# Patient Record
Sex: Female | Born: 1960
Health system: Southern US, Community
[De-identification: ages and names within clinical notes are randomized; demographics above are authoritative.]

## PROBLEM LIST (undated history)

## (undated) DIAGNOSIS — L900 Lichen sclerosus et atrophicus: Secondary | ICD-10-CM

## (undated) DIAGNOSIS — I10 Essential (primary) hypertension: Secondary | ICD-10-CM

## (undated) DIAGNOSIS — L940 Localized scleroderma [morphea]: Secondary | ICD-10-CM

## (undated) DIAGNOSIS — N200 Calculus of kidney: Secondary | ICD-10-CM

## (undated) DIAGNOSIS — B029 Zoster without complications: Secondary | ICD-10-CM

## (undated) DIAGNOSIS — Z79899 Other long term (current) drug therapy: Secondary | ICD-10-CM

## (undated) DIAGNOSIS — N6310 Unspecified lump in the right breast, unspecified quadrant: Secondary | ICD-10-CM

## (undated) DIAGNOSIS — N841 Polyp of cervix uteri: Secondary | ICD-10-CM

## (undated) HISTORY — DX: Polyp of cervix uteri: N84.1

## (undated) HISTORY — DX: Zoster without complications: B02.9

## (undated) HISTORY — PX: TUBAL LIGATION: SHX77

## (undated) HISTORY — DX: Calculus of kidney: N20.0

## (undated) HISTORY — PX: NASAL SINUS SURGERY: SHX719

## (undated) HISTORY — DX: Lichen sclerosus et atrophicus: L90.0

## (undated) HISTORY — DX: Localized scleroderma (morphea): L94.0

---
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
## 1997-07-18 HISTORY — PX: CYSTECTOMY: SUR359

## 2002-10-21 ENCOUNTER — Encounter: Admission: RE | Admit: 2002-10-21 | Discharge: 2003-01-19 | Payer: Self-pay

## 2004-10-04 ENCOUNTER — Ambulatory Visit: Payer: Self-pay | Admitting: Otolaryngology

## 2011-10-11 ENCOUNTER — Other Ambulatory Visit: Payer: Self-pay | Admitting: Obstetrics and Gynecology

## 2011-10-11 DIAGNOSIS — Z1231 Encounter for screening mammogram for malignant neoplasm of breast: Secondary | ICD-10-CM

## 2011-11-08 ENCOUNTER — Ambulatory Visit
Admission: RE | Admit: 2011-11-08 | Discharge: 2011-11-08 | Disposition: A | Discharge status: Home / Self Care | Payer: BC Managed Care – PPO | Source: Ambulatory Visit | Attending: Obstetrics and Gynecology | Admitting: Obstetrics and Gynecology

## 2011-11-08 DIAGNOSIS — Z1231 Encounter for screening mammogram for malignant neoplasm of breast: Secondary | ICD-10-CM

## 2012-10-23 ENCOUNTER — Encounter: Payer: Self-pay | Admitting: Certified Nurse Midwife

## 2012-10-25 ENCOUNTER — Encounter: Payer: Self-pay | Admitting: Certified Nurse Midwife

## 2012-10-25 ENCOUNTER — Ambulatory Visit (INDEPENDENT_AMBULATORY_CARE_PROVIDER_SITE_OTHER): Payer: BC Managed Care – PPO | Admitting: Certified Nurse Midwife

## 2012-10-25 VITALS — BP 100/60 | Ht 62.25 in | Wt 177.0 lb

## 2012-10-25 DIAGNOSIS — R319 Hematuria, unspecified: Secondary | ICD-10-CM

## 2012-10-25 DIAGNOSIS — Z01419 Encounter for gynecological examination (general) (routine) without abnormal findings: Secondary | ICD-10-CM

## 2012-10-25 DIAGNOSIS — Z0000 Encounter for general adult medical examination without abnormal findings: Secondary | ICD-10-CM

## 2012-10-25 DIAGNOSIS — N3946 Mixed incontinence: Secondary | ICD-10-CM

## 2012-10-25 LAB — POCT URINALYSIS DIPSTICK
Bilirubin, UA: NEGATIVE
Ketones, UA: NEGATIVE
Leukocytes, UA: NEGATIVE
pH, UA: 5

## 2012-10-25 NOTE — Progress Notes (Signed)
52 y.o. MarriedCaucasian female   G1P1001 here for annual exam. Peri menopausal with cycle change with amenorrhea treated with Provera 09/10/2012 with withdrawal bleeding on 09-28-12. Duration of cycle 4 days, light to moderate bleeding.   Keeping menses calendar now. Occasional hot flashes and night sweats, no insomnia.  Continues urgency , frequency, incontinence sometimes with cough, has increased over the past 2 weeks.  No burning or pain or urine odor Denies fever, chills or headache.  No new personal products. No other health issues.  Sees urgent care as needed.   Patient's last menstrual period was 09/27/2012.          Sexually active: yes  The current method of family planning is tubal ligation.    Exercising: no  exercise Last mammogram: 11-08-11 Last pap: 2011 Last BMD: none Alcohol: none Tobacco: none Colonoscopy: 01-06-2012 polyps f/u 5 yrs   Health Maintenance  Topic Date Due  . Pap Smear  02/07/1979  . Colonoscopy  02/07/2011  . Influenza Vaccine  03/18/2013  . Mammogram  11/07/2013  . Tetanus/tdap  09/27/2021    Family History  Problem Relation Age of Onset  . Hypertension Mother   . Diabetes Paternal Grandmother   . Diabetes Paternal Grandfather     There is no problem list on file for this patient.   History reviewed. No pertinent past medical history.  Past Surgical History  Procedure Laterality Date  . Tubal ligation      BTSP  . Cystectomy  1999    ovarian cystectomy  . Nasal sinus surgery      Allergies: Review of patient's allergies indicates no known allergies.  Current Outpatient Prescriptions  Medication Sig Dispense Refill  . BENICAR 40 MG tablet       . hydrochlorothiazide (HYDRODIURIL) 25 MG tablet       . IBUPROFEN PO Take by mouth as needed.       No current facility-administered medications for this visit.    ROS: Pertinent items are noted in HPI.  Exam:    BP 100/60  Ht 5' 2.25" (1.581 m)  Wt 177 lb (80.287 kg)  BMI 32.12  kg/m2  LMP 09/27/2012 Weight change: @WEIGHTCHANGE @ Last 3 height recordings:  Ht Readings from Last 3 Encounters:  10/25/12 5' 2.25" (1.581 m)   General appearance: alert, cooperative and appears stated age Head: Normocephalic, without obvious abnormality, atraumatic Neck: no adenopathy, supple, symmetrical, trachea midline and thyroid not enlarged, symmetric, no tenderness/mass/nodules Lungs: clear to auscultation bilaterally Breasts: normal appearance, no masses or tenderness, No nipple retraction or dimpling Heart: regular rate and rhythm Abdomen: soft, non-tender; bowel sounds normal; no masses,  no organomegaly  No suprapubic tenderness Extremities: extremities normal, atraumatic, no cyanosis or edema Skin: Skin color, texture, turgor normal. No rashes or lesions Lymph nodes: Cervical, supraclavicular, and axillary nodes normal. no inguinal nodes palpated Neurologic: Grossly normal   Pelvic: External genitalia:  no lesions              Urethra: normal appearing urethra with no masses, tenderness or lesions,  Bladder non-tender, urethra meatus non tender or re              Bartholins and Skenes: Bartholin's, Urethra, Skene's normal                 Vagina: normal appearing vagina with normal color and discharge, no lesions              Cervix: normal appearance and thin prep PAP  obtained              Pap taken: yes        Bimanual Exam:  Uterus:  uterus is normal size, shape, consistency and nontender, anteverted                                      Adnexa:    normal adnexa in size, nontender and no masses                                      Rectovaginal: Confirms                                      Anus:  normal sphincter tone, no lesions  A: Normal well woman exam Perimenopausal with irregular cycles Urinary frequency with  ?mixed incontinence Family history of diabetes     P:Reviewed health and wellness as pertinent Mammogram yearly  Stressed due and need to schedule,  declined our office scheduling Pap smear as indicated Continue menses calendar an notify if no menses in 3 months, patient agreeable Encouraged to increase water intake and decrease tea, coffee, soda Given UTI warning signs and symptoms Lab: Urine micro and culture CMP, Lipid panel, TSH, Hgb A1C  return annually or prn      An After Visit Summary was printed and given to the patient.  Reviewed, TL

## 2012-10-25 NOTE — Patient Instructions (Signed)

## 2012-10-26 DIAGNOSIS — N3946 Mixed incontinence: Secondary | ICD-10-CM | POA: Insufficient documentation

## 2012-10-26 LAB — URINE CULTURE: Colony Count: 45000

## 2012-10-26 LAB — LIPID PANEL
Cholesterol: 181 mg/dL (ref 0–200)
Total CHOL/HDL Ratio: 3.4 Ratio
Triglycerides: 114 mg/dL (ref <150)
VLDL: 23 mg/dL (ref 0–40)

## 2012-10-26 LAB — COMPREHENSIVE METABOLIC PANEL
BUN: 19 mg/dL (ref 6–23)
CO2: 27 mEq/L (ref 19–32)
Calcium: 9.9 mg/dL (ref 8.4–10.5)
Chloride: 102 mEq/L (ref 96–112)
Creat: 0.89 mg/dL (ref 0.50–1.10)
Glucose, Bld: 103 mg/dL — ABNORMAL HIGH (ref 70–99)

## 2012-10-26 LAB — URINALYSIS, MICROSCOPIC ONLY

## 2012-10-28 LAB — IPS PAP TEST WITH HPV

## 2012-10-30 ENCOUNTER — Telehealth: Payer: Self-pay | Admitting: *Deleted

## 2012-10-30 NOTE — Telephone Encounter (Signed)
Message copied by Alisa Graff on Tue Oct 30, 2012  5:15 PM ------      Message from: Eliezer Bottom      Created: Mon Oct 29, 2012  1:08 PM       Fannie Knee please inform patient of results. It doesn't look like patient has a pcp so she will need a referral if not. I already have patient in pap recall so that is done. ------

## 2012-10-30 NOTE — Telephone Encounter (Signed)
Notified of lab results as Raven Romero's instructions.  Patient states she has no symptoms of yeast so may wait to see if clears on its own.  States she does not have PCP and would like to know if Raven Romero has someone from [place] area to recommend.  Has signed up for MY Chart and would like copy of labs. Can we release to My Chart? Patient also wants you to update her records that she had colonoscopy 12-2011.

## 2012-10-31 NOTE — Telephone Encounter (Signed)
I do not know a particular group in [place].  Can not release abnormal results from my chart that I am aware of.

## 2012-11-06 NOTE — Telephone Encounter (Signed)
agree

## 2012-11-06 NOTE — Telephone Encounter (Signed)
Patient notified that Debbi does not know anyone in [place] area and that we will be able to send results thru My Chart.  Patient states she has already been able to print now.  Will make PCP appt herself.

## 2012-11-07 NOTE — Telephone Encounter (Signed)
pt states she is returning a call to [employer]

## 2012-11-08 ENCOUNTER — Telehealth: Payer: Self-pay | Admitting: *Deleted

## 2012-11-08 NOTE — Telephone Encounter (Signed)
Patient of D. Darcel Bayley, was seen 10/25/2012, AEX, lab results show yeast infection. Tried OTC x 7 day but is no better. Please advise . Fannie Knee . Will get chart if needed.  [facility] [place].

## 2012-11-09 ENCOUNTER — Other Ambulatory Visit: Payer: Self-pay | Admitting: Orthopedic Surgery

## 2012-11-09 ENCOUNTER — Other Ambulatory Visit: Payer: Self-pay | Admitting: Nurse Practitioner

## 2012-11-09 DIAGNOSIS — B373 Candidiasis of vulva and vagina: Secondary | ICD-10-CM

## 2012-11-09 MED ORDER — FLUCONAZOLE 150 MG PO TABS: 150.0000 mg | ORAL_TABLET | Freq: Once | ORAL | Status: DC

## 2012-11-09 NOTE — Telephone Encounter (Signed)
Pt was called and given message that RX was sent to pharmacy.

## 2012-11-09 NOTE — Telephone Encounter (Signed)
Should be seen to confirm, can she wait until Monday?

## 2012-11-09 NOTE — Telephone Encounter (Signed)
Spoke to patient to confirm that this request has already been taken care of. She spoke to Amy earlier today and Patty called medication in.

## 2012-11-14 NOTE — Telephone Encounter (Signed)
Pt has separate phone note on 11/08/12 and problem was taken care of and meds called to pharmacy.  Encounter closed.

## 2012-11-19 ENCOUNTER — Encounter: Payer: Self-pay | Admitting: Certified Nurse Midwife

## 2012-11-19 NOTE — Telephone Encounter (Signed)
Joy did not know if you needed this info.

## 2012-11-20 ENCOUNTER — Ambulatory Visit (INDEPENDENT_AMBULATORY_CARE_PROVIDER_SITE_OTHER): Payer: BC Managed Care – PPO | Admitting: Certified Nurse Midwife

## 2012-11-20 ENCOUNTER — Encounter: Payer: Self-pay | Admitting: Certified Nurse Midwife

## 2012-11-20 ENCOUNTER — Telehealth: Payer: Self-pay | Admitting: *Deleted

## 2012-11-20 VITALS — BP 118/70 | HR 72 | Resp 16 | Wt 174.0 lb

## 2012-11-20 DIAGNOSIS — N3946 Mixed incontinence: Secondary | ICD-10-CM

## 2012-11-20 DIAGNOSIS — N390 Urinary tract infection, site not specified: Secondary | ICD-10-CM

## 2012-11-20 DIAGNOSIS — B373 Candidiasis of vulva and vagina: Secondary | ICD-10-CM

## 2012-11-20 LAB — POCT URINALYSIS DIPSTICK
Protein, UA: NEGATIVE
Spec Grav, UA: 1.02
Urobilinogen, UA: NEGATIVE

## 2012-11-20 MED ORDER — CIPROFLOXACIN HCL 500 MG PO TABS: 500.0000 mg | ORAL_TABLET | Freq: Two times a day (BID) | ORAL | Status: DC

## 2012-11-20 MED ORDER — PHENAZOPYRIDINE HCL 200 MG PO TABS: 200.0000 mg | ORAL_TABLET | Freq: Three times a day (TID) | ORAL | Status: DC

## 2012-11-20 MED ORDER — PHENAZOPYRIDINE HCL 200 MG PO TABS: 200.0000 mg | ORAL_TABLET | Freq: Three times a day (TID) | ORAL | Status: DC | PRN

## 2012-11-20 NOTE — Telephone Encounter (Signed)
Patient called in response to her My Chart info to D. Leonard,CNM,. States having UTI symptoms and follow up with yeast infection problem. Appt. Given to day @ 2:30pm

## 2012-11-20 NOTE — Progress Notes (Signed)
52 y.o. Married Caucasian female G1P1001 here for follow up of yeast vaginitis treated with Diflucan initiated on October 26 2012. Completed all medication as directed.  Denies any symptoms of vaginal itching or burning now. Patient complaining of urinary frequency, urgency and discomfort to pain at end of stream of urine. Incontinence has increased.  Denies fever,chills,headache or back pain. "Feels like what I had before it became a severe UTI"  O: Healthy WD,WN female Affect:normal, orientation X3  Skin:warm and dry Abdomen:suprapubic tenderness Pelvic exam:EXTERNAL GENITALIA: normal appearing vulva with no masses, tenderness or lesions Urethra: tender, Urethra meatus: tender, slight redness, 3 drops of leakage noted  palpation Bladder:tender VAGINA: no abnormal discharge or lesions, Wet Prep/KOH one yeast bud, pH 4.0 , negative clue CERVIX: no lesions or cervical motion tenderness UTERUS:normal, non tender ADNEXA: no masses palpable and non tender POCT urine: Leukocytes 2+ Blood 1+    A: Yeast vaginitis resolved UTI Mixed incontinence   P:Reviewed findings, reassured resolved Discussed findings of UTI warning signs and symptoms given. Decrease tea and coffee use. Rx Cipro see order Rx Pyridium see order Lab: urine culture Discussed with patient once the UTI has resolved scheduling an appointment with Dr. Edward Jolly for evaluation of incontinence issue.  Patient would like to schedule.   TOC 2 weeks if culture positive    Rv prn Reviewed, TL

## 2012-11-22 ENCOUNTER — Encounter: Payer: Self-pay | Admitting: Certified Nurse Midwife

## 2012-11-22 ENCOUNTER — Telehealth: Payer: Self-pay | Admitting: Orthopedic Surgery

## 2012-11-22 LAB — URINE CULTURE: Colony Count: NO GROWTH

## 2012-11-22 NOTE — Telephone Encounter (Signed)
Spoke with Raven Romero about urine culture being negative. Raven Romero reports she is still having some symptoms. Raven Romero has had kidney stones before and is wondering if they are flaring up again. Raven Romero states her urologist has retired. Raven Romero would like to see Dr. Edward Jolly. Sched appt with BS tomorrow at 1:45 per Raven Romero request.

## 2012-11-22 NOTE — Telephone Encounter (Signed)
Call to check on pt's symptoms. Reached VM. Advised pt to call back and make appt if she desires.

## 2012-11-22 NOTE — Telephone Encounter (Signed)
Message copied by Alfredo Batty on Thu Nov 22, 2012  3:11 PM ------      Message from: Verner Chol      Created: Thu Nov 22, 2012  1:38 PM       Notify urine culture no growth, if patient is still symptomatic she will need to see PCP or urology      Also will need to schedule consult with Dr. Edward Jolly for urinary incontinence if she does not see a urologist. ------

## 2012-11-23 ENCOUNTER — Encounter: Payer: Self-pay | Admitting: Obstetrics and Gynecology

## 2012-11-23 ENCOUNTER — Ambulatory Visit (INDEPENDENT_AMBULATORY_CARE_PROVIDER_SITE_OTHER): Payer: BC Managed Care – PPO | Admitting: Obstetrics and Gynecology

## 2012-11-23 VITALS — BP 100/60 | Wt 174.0 lb

## 2012-11-23 DIAGNOSIS — N393 Stress incontinence (female) (male): Secondary | ICD-10-CM

## 2012-11-23 DIAGNOSIS — R350 Frequency of micturition: Secondary | ICD-10-CM

## 2012-11-23 DIAGNOSIS — N952 Postmenopausal atrophic vaginitis: Secondary | ICD-10-CM

## 2012-11-23 DIAGNOSIS — N390 Urinary tract infection, site not specified: Secondary | ICD-10-CM

## 2012-11-23 LAB — POCT URINALYSIS DIPSTICK
Ketones, UA: NEGATIVE
Leukocytes, UA: NEGATIVE
Protein, UA: NEGATIVE
pH, UA: 5

## 2012-11-23 MED ORDER — ESTROGENS, CONJUGATED 0.625 MG/GM VA CREA: TOPICAL_CREAM | Freq: Every day | VAGINAL | Status: DC

## 2012-11-23 NOTE — Patient Instructions (Addendum)
Atrophic Vaginitis Atrophic vaginitis is a problem of low levels of estrogen in women. This problem can happen at any age. It is most common in women who have gone through menopause ("the change").  HOW WILL I KNOW IF I HAVE THIS PROBLEM? You may have:  Trouble with peeing (urinating), such as:  Going to the bathroom often.  A hard time holding your pee until you reach a bathroom.  Leaking pee.  Having pain when you pee.  Itching or a burning feeling.  Vaginal bleeding and spotting.  Pain during sex.  Dryness of the vagina.  A yellow, bad-smelling fluid (discharge) coming from the vagina. HOW WILL MY DOCTOR CHECK FOR THIS PROBLEM?  During your exam, your doctor will likely find the problem.  If there is a vaginal fluid, it may be checked for infection. HOW WILL THIS PROBLEM BE TREATED? Keep the vulvar skin as clean as possible. Moisturizers and lubricants can help with some of the symptoms. Estrogen replacement can help. There are 2 ways to take estrogen:  Systemic estrogen gets estrogen to your whole body. It takes many weeks or months before the symptoms get better.  You take an estrogen pill.  You use a skin patch. This is a patch that you put on your skin.  If you still have your uterus, your doctor may ask you to take a hormone. Talk to your doctor about the right medicine for you.  Estrogen cream.  This puts estrogen only at the part of your body where you apply it. The cream is put into the vagina or put on the vulvar skin. For some women, estrogen cream works faster than pills or the patch. CAN ALL WOMEN WITH THIS PROBLEM USE ESTROGEN? No. Women with certain types of cancer, liver problems, or problems with blood clots should not take estrogen. Your doctor can help you decide the best treatment for your symptoms. Document Released: 12/21/2007 Document Revised: 09/26/2011 Document Reviewed: 12/21/2007 [facility] Patient Information 2013 [place], [place]. Urinary  Frequency The number of times a normal person urinates depends upon how much liquid they take in and how much liquid they are losing. If the temperature is hot and there is high humidity then the person will sweat more and usually breathe a little more frequently. These factors decrease the amount of frequency of urination that would be considered normal. The amount you drink is easily determined, but the amount of fluid lost is sometimes more difficult to calculate.  Fluid is lost in two ways:  Sensible fluid loss is usually measured by the amount of urine that you get rid of. Losses of fluid can also occur with diarrhea.  Insensible fluid loss is more difficult to measure. It is caused by evaporation. Insensible loss of fluid occurs through breathing and sweating. It usually ranges from a little less than a quart to a little more than a quart of fluid a day. In normal temperatures and activity levels the average person may urinate 4 to 7 times in a 24-hour period. Needing to urinate more often than that could indicate a problem. If one urinates 4 to 7 times in 24 hours and has large volumes each time, that could indicate a different problem from one who urinates 4 to 7 times a day and has small volumes. The time of urinating is also an important. Most urinating should be done during the waking hours. Getting up at night to urinate frequently can indicate some problems. CAUSES  The bladder is the  organ in your lower abdomen that holds urine. Like a balloon, it swells some as it fills up. Your nerves sense this and tell you it is time to head for the bathroom. There are a number of reasons that you might feel the need to urinate more often than usual. They include:  Urinary tract infection. This is usually associated with other signs such as burning when you urinate.  In men, problems with the prostate (a walnut-size gland that is located near the tube that carries urine out of your body). There are  two reasons why the prostate can cause an increased frequency of urination:  An enlarged prostate that does not let the bladder empty well. If the bladder only half empties when you urinate then it only has half the capacity to fill before you have to urinate again.  The nerves in the bladder become more hypersensitive with an increased size of the prostate even if the bladder empties completely.  Pregnancy.  Obesity. Excess weight is more likely to cause a problem for women more than for men.  Bladder stones or other bladder problems.  Caffeine.  Alcohol.  Medications. For example, drugs that help the body get rid of extra fluid (diuretics) increase urine production. Some other medicines must be taken with lots of fluids.  Muscle or nerve weakness. This might be the result of a spinal cord injury, a stroke, multiple sclerosis or Parkinson's disease.  Long-standing diabetes can decrease the sensation of the bladder. This loss of sensation makes it harder to sense the bladder needs to be emptied. Over a period of years the bladder is stretched out by constant overfilling. This weakens the bladder muscles so that the bladder does not empty well and has less capacity to fill with new urine.  Interstitial cystitis (also called painful bladder syndrome). This condition develops because the tissues that line the insider of the bladder are inflamed (inflammation is the body's way of reacting to injury or infection). It causes pain and frequent urination. It occurs in women more often than in men. DIAGNOSIS   To decide what might be causing your urinary frequency, your healthcare provider will probably:  Ask about symptoms you have noticed.  Ask about your overall health. This will include questions about any medications you are taking.  Do a physical examination.  Order some tests. These might include:  A blood test to check for diabetes or other health issues that could be contributing  to the problem.  Urine testing. This could measure the flow of urine and the pressure on the bladder.  A test of your neurological system (the brain, spinal cord and nerves). This is the system that senses the need to urinate.  A bladder test to check whether it is emptying completely when you urinate.  Cytoscopy. This test uses a thin tube with a tiny camera on it. It offers a look inside your urethra and bladder to see if there are problems.  Imaging tests. You might be given a contrast dye and then asked to urinate. X-rays are taken to see how your bladder is working. TREATMENT  It is important for you to be evaluated to determine if the amount or frequency that you have is unusual or abnormal. If it is found to be abnormal the cause should be determined and this can usually be found out easily. Depending upon the cause treatment could include medication, stimulation of the nerves, or surgery. There are not too many things that you  can do as an individual to change your urinary frequency. It is important that you balance the amount of fluid intake needed to compensate for your activity and the temperature. Medical problems will be diagnosed and taken care of by your physician. There is no particular bladder training such as Kegel's exercises that you can do to help urinary frequency. This is an exercise this is usually done for people who have leaking of urine when they laugh cough or sneeze. HOME CARE INSTRUCTIONS   Take any medications your healthcare provider prescribed or suggested. Follow the directions carefully.  Practice any lifestyle changes that are recommended. These might include:  Drinking less fluid or drinking at different times of the day. If you need to urinate often during the night, for example, you may need to stop drinking fluids early in the evening.  Cutting down on caffeine or alcohol. They both can make you need to urinate more often than normal. Caffeine is found in  coffee, tea and sodas.  Losing weight, if that is recommended.  Keep a journal or a log. You might be asked to record how much you drink and when and when you feel the need to urinate. This will also help evaluate how well the treatment provided by your physician is working. SEEK MEDICAL CARE IF:   Your need to urinate often gets worse.  You feel increased pain or irritation when you urinate.  You notice blood in your urine.  You have questions about any medications that your healthcare provider recommended.  You notice blood, pus or swelling at the site of any test or treatment procedure.  You develop a fever of more than 100.5 F (38.1 C). SEEK IMMEDIATE MEDICAL CARE IF:  You develop a fever of more than 102.0 F (38.9 C). Document Released: 04/30/2009 Document Revised: 09/26/2011 Document Reviewed: 04/30/2009 [facility] Patient Information 2013 [place], [place].

## 2012-11-23 NOTE — Progress Notes (Signed)
Patient ID: Raven Romero, female   DOB: Jan 01, 1961, 52 y.o.   MRN: [id number]  Subjective  51 year old G1P1 Caucasian female who presents with persistent concerns of vaginal irritation and discomfort.   Has been treated for multiple episodes of yeast infections, bacterial vaginosis, or urinary tract infection.      Saw Sara Chu this week with frequency, nocturia, and back pain.  Had urinalysis showing WBCs and RBCs and was treated with Ciprofloxacin and Pyridium.  Medications discontinued due to the negative urine culture.  States that the back pain is resolved and that she feels much better today.      Patient has a history of kidney stones and used to see Dr. Vonita Moss.  Has a diagosis of interstitial cystitis.  Did bladder instillations years ago.  Told she has a small bladder volume.     Some urinary incontinence with running.  Worse in the last 6 months.    Menses just light spotting the last two months.  Took Provera.  Feels worse overall since taking Provera. No normal period since August 2013.  No hot flashes or hight sweats.  Some vaginal dryness.    Mammogram done this am at [facility].   Patient's husband is being treated for prostatitis and prostatic hypertrophy for the last several months.  He has taken multiple rounds of antibiotics.   Drinks very little coffee.  No regular caffeine drinks, citrus or carbonated beverages.   Objective  Urine dip with trace nitrites  Abdomen - soft, nontender, nondistended.   No hepatosplenomegaly or organomegaly. Pelvic - Normal external genitalia and urethra.  First degree cystocele and first degree rectocele with valsalva maneuver.  Cervix and vagina no lesions.  Uterus small and nontender.  No adnexal masses or tenderness.  Assessment  Urinary frequency. Nocturia. Genuine stress incontinence. Mild atrophic vaginitis. Patient's partner with prostatitis.  Plan.  UC sent. I discussed with patient stress incontinence and  options for treatment including physical therapy and midurethral slings.  Patient given ACOG literature to take home with her today. Rx for premarin vaginal cream 1/2 gram pv at hs for the next 10 night. I will re-evaluate patient in 2 weeks. Consider Affirm testing.

## 2012-12-03 ENCOUNTER — Encounter: Payer: Self-pay | Admitting: Obstetrics and Gynecology

## 2012-12-03 ENCOUNTER — Telehealth: Payer: Self-pay | Admitting: Obstetrics and Gynecology

## 2012-12-03 NOTE — Telephone Encounter (Signed)
Dr. Edward Jolly,  This patient canceled her 2 week reck apptointment5/23/2014, Patient says she is feeling better and no longer needs this appointment .T Thanks,  Arna Medici

## 2012-12-06 ENCOUNTER — Ambulatory Visit: Payer: BC Managed Care – PPO | Admitting: Obstetrics and Gynecology

## 2012-12-14 ENCOUNTER — Telehealth: Payer: Self-pay | Admitting: *Deleted

## 2012-12-17 ENCOUNTER — Telehealth: Payer: Self-pay | Admitting: *Deleted

## 2012-12-17 NOTE — Telephone Encounter (Signed)
FOLLOW UP MAMMOGRAM SCANNED IN TO EPIC ON 12/07/2012 BY JASMINE. DO YOU STILL NEED TO PUT ON HOLD. OR OUT OF HOLD . SEE PRINTED COPY TO WRITE ON IF NEED. SUE

## 2012-12-18 ENCOUNTER — Telehealth: Payer: Self-pay | Admitting: *Deleted

## 2012-12-18 NOTE — Telephone Encounter (Signed)
Patient notified of mammogram results and advise of Dr. Tresa Res to have 6 month follow up mammogram at [facility] radiology. patient understands this and is agreeable.

## 2012-12-18 NOTE — Telephone Encounter (Signed)
Tell pt they saw a place in her left breast that they think is benign, but they want a f/u mm in 6 mos.  She should schedule it there.

## 2012-12-18 NOTE — Telephone Encounter (Signed)
Left message on CB# of Home and Cell to return call to our office for results of mammogram per Dr. Tresa Res.

## 2012-12-18 NOTE — Telephone Encounter (Signed)
Patient final result of mammogram faxed from [facility]. Report in your cabinet. sue

## 2013-03-04 NOTE — Telephone Encounter (Signed)
, °

## 2013-04-17 ENCOUNTER — Ambulatory Visit: Payer: Self-pay | Admitting: Specialist

## 2013-05-23 ENCOUNTER — Other Ambulatory Visit: Payer: Self-pay

## 2013-10-29 ENCOUNTER — Ambulatory Visit (INDEPENDENT_AMBULATORY_CARE_PROVIDER_SITE_OTHER): Payer: BC Managed Care – PPO | Admitting: Certified Nurse Midwife

## 2013-10-29 ENCOUNTER — Encounter: Payer: Self-pay | Admitting: Certified Nurse Midwife

## 2013-10-29 VITALS — BP 118/64 | HR 68 | Resp 16 | Ht 61.75 in | Wt 182.0 lb

## 2013-10-29 DIAGNOSIS — N951 Menopausal and female climacteric states: Secondary | ICD-10-CM

## 2013-10-29 DIAGNOSIS — Z0000 Encounter for general adult medical examination without abnormal findings: Secondary | ICD-10-CM

## 2013-10-29 DIAGNOSIS — Z01419 Encounter for gynecological examination (general) (routine) without abnormal findings: Secondary | ICD-10-CM

## 2013-10-29 LAB — POCT URINALYSIS DIPSTICK
Bilirubin, UA: NEGATIVE
Blood, UA: NEGATIVE
GLUCOSE UA: NEGATIVE
Ketones, UA: NEGATIVE
Leukocytes, UA: NEGATIVE
NITRITE UA: NEGATIVE
PROTEIN UA: NEGATIVE
UROBILINOGEN UA: NEGATIVE
pH, UA: 5

## 2013-10-29 LAB — HEMOGLOBIN, FINGERSTICK: HEMOGLOBIN, FINGERSTICK: 12.1 g/dL (ref 12.0–16.0)

## 2013-10-29 NOTE — Progress Notes (Signed)
53 y.o. [id number] Married Caucasian Fe here for annual exam. Perimenopausal with irregular cycles. Complaining of hot flashes and night sweats for the past month. No period since or 12/14 or 2/15 which lasted 10 days with spotting afterward with cramping also. No bleeding since. Occasional vaginal dryness, much better with probiotic use. Sees Urgent care prn. Patient did not follow up with elevated cholesterol and borderline Hgb A1c . No other health issues today.  Patient's last menstrual period was 08/18/2013.          Sexually active: yes  The current method of family planning is tubal ligation.    Exercising: yes walking Smoker:  no  Health Maintenance: Pap:  10-25-12 neg HPV HR neg MMG:  11-23-12 bilateral, 12-04-12 left breast probably benign, 06-07-13 report doesn't look finished, patient states she was told it was fine repeat in 5/15 Colonoscopy: 01-06-12 f/u 151yrs BMD:   none TDaP:  2013 Labs: Poct urine-neg, hgb-12.1 Self breast exam:done occ   reports that she has never smoked. She does not have any smokeless tobacco history on file. She reports that she drinks about 2.5 ounces of alcohol per week. She reports that she does not use illicit drugs.  Past Medical History  Diagnosis Date  . Kidney stones     Past Surgical History  Procedure Laterality Date  . Tubal ligation      BTSP  . Cystectomy  1999    ovarian cystectomy  . Nasal sinus surgery      Current Outpatient Prescriptions  Medication Sig Dispense Refill  . BENICAR 40 MG tablet daily.       . hydrochlorothiazide (HYDRODIURIL) 25 MG tablet daily.       . IBUPROFEN PO Take by mouth as needed.       No current facility-administered medications for this visit.    Family History  Problem Relation Age of Onset  . Hypertension Mother   . Breast cancer Mother   . Diabetes Paternal Grandmother   . Diabetes Paternal Grandfather     ROS:  Pertinent items are noted in HPI.  Otherwise, a comprehensive ROS was  negative.  Exam:   BP 118/64  Pulse 68  Resp 16  Ht 5' 1.75" (1.568 m)  Wt 182 lb (82.555 kg)  BMI 33.58 kg/m2  LMP 08/18/2013 Height: 5' 1.75" (156.8 cm)  Ht Readings from Last 3 Encounters:  10/29/13 5' 1.75" (1.568 m)  10/25/12 5' 2.25" (1.581 m)    General appearance: alert, cooperative and appears stated age Head: Normocephalic, without obvious abnormality, atraumatic Neck: no adenopathy, supple, symmetrical, trachea midline and thyroid normal to inspection and palpation and non-palpable Lungs: clear to auscultation bilaterally Breasts: normal appearance, no masses or tenderness, No nipple retraction or dimpling, No nipple discharge or bleeding, No axillary or supraclavicular adenopathy Heart: regular rate and rhythm Abdomen: soft, non-tender; no masses,  no organomegaly Extremities: extremities normal, atraumatic, no cyanosis or edema Skin: Skin color, texture, turgor normal. No rashes or lesions Lymph nodes: Cervical, supraclavicular, and axillary nodes normal. No abnormal inguinal nodes palpated Neurologic: Grossly normal   Pelvic: External genitalia:  no lesions              Urethra:  normal appearing urethra with no masses, tenderness or lesions              Bartholin's and Skene's: normal                 Vagina: normal appearing vagina with normal color  and discharge, no lesions              Cervix: normal, non tender              Pap taken: no Bimanual Exam:  Uterus:  normal size, contour, position, consistency, mobility, non-tender and anteverted              Adnexa: normal adnexa and no mass, fullness, tenderness               Rectovaginal: Confirms               Anus:  normal sphincter tone, no lesions  A:  Well Woman with normal exam  Contraception Tubal ligation  Perimenopausal with amenorrhea  Screening labs  P:   Reviewed health and wellness pertinent to exam  Aware of etiology and forgets to record on calendar  Discussed checking FSH to evaluate,  TSH, prolactin may need Provera challenge, will advise once labs in.  Labs:Lipid panel, CMP,Hgb A1c,Vitamin D  Pap smear as per guidelines   Mammogram yearly stressed follow up as indicated by last follow up for benign appearing mass pap smear not taken today Stressed importance of follow up of labs if abnormal  counseled on breast self exam, mammography screening, diet and exercise and weight management encouraged. Questions addressed.  return annually or prn  An After Visit Summary was printed and given to the patient.

## 2013-10-29 NOTE — Patient Instructions (Signed)

## 2013-10-30 LAB — COMPREHENSIVE METABOLIC PANEL
ALK PHOS: 76 U/L (ref 39–117)
ALT: 11 U/L (ref 0–35)
AST: 13 U/L (ref 0–37)
Albumin: 4 g/dL (ref 3.5–5.2)
BILIRUBIN TOTAL: 0.3 mg/dL (ref 0.2–1.2)
BUN: 14 mg/dL (ref 6–23)
CO2: 25 meq/L (ref 19–32)
CREATININE: 0.78 mg/dL (ref 0.50–1.10)
Calcium: 9.6 mg/dL (ref 8.4–10.5)
Chloride: 102 mEq/L (ref 96–112)
Glucose, Bld: 132 mg/dL — ABNORMAL HIGH (ref 70–99)
Potassium: 3.9 mEq/L (ref 3.5–5.3)
Sodium: 140 mEq/L (ref 135–145)
Total Protein: 6.6 g/dL (ref 6.0–8.3)

## 2013-10-30 LAB — LIPID PANEL
Cholesterol: 170 mg/dL (ref 0–200)
HDL: 54 mg/dL (ref >39)
LDL Cholesterol: 80 mg/dL (ref 0–99)
Total CHOL/HDL Ratio: 3.1 Ratio
Triglycerides: 180 mg/dL — ABNORMAL HIGH (ref <150)
VLDL: 36 mg/dL (ref 0–40)

## 2013-10-30 LAB — HEMOGLOBIN A1C
HEMOGLOBIN A1C: 5.9 % — AB (ref <5.7)
Mean Plasma Glucose: 123 mg/dL — ABNORMAL HIGH (ref <117)

## 2013-10-30 LAB — VITAMIN D 25 HYDROXY (VIT D DEFICIENCY, FRACTURES): VIT D 25 HYDROXY: 39 ng/mL (ref 30–89)

## 2013-10-30 LAB — TSH: TSH: 1.308 u[IU]/mL (ref 0.350–4.500)

## 2013-10-30 LAB — FOLLICLE STIMULATING HORMONE: FSH: 78.8 m[IU]/mL

## 2013-10-31 ENCOUNTER — Other Ambulatory Visit: Payer: Self-pay | Admitting: Orthopedic Surgery

## 2013-10-31 ENCOUNTER — Other Ambulatory Visit: Payer: Self-pay | Admitting: Certified Nurse Midwife

## 2013-10-31 DIAGNOSIS — N912 Amenorrhea, unspecified: Secondary | ICD-10-CM

## 2013-10-31 DIAGNOSIS — N951 Menopausal and female climacteric states: Secondary | ICD-10-CM

## 2013-10-31 DIAGNOSIS — R739 Hyperglycemia, unspecified: Secondary | ICD-10-CM

## 2013-10-31 MED ORDER — MEDROXYPROGESTERONE ACETATE 10 MG PO TABS: 10.0000 mg | ORAL_TABLET | Freq: Every day | ORAL | Status: DC

## 2013-11-07 ENCOUNTER — Telehealth: Payer: Self-pay

## 2013-11-07 NOTE — Telephone Encounter (Signed)
Message copied by Jannet AskewHINES, Channa Hazelett E on Thu Nov 07, 2013 11:33 AM ------      Message from: Verner CholLEONARD, DEBORAH S      Created: Fri Nov 01, 2013  8:12 AM       Please make sure patient does make appointment with PCP. ------

## 2013-11-07 NOTE — Telephone Encounter (Signed)
Yes please put that on your recall for Deb for a few weeks

## 2013-11-07 NOTE — Telephone Encounter (Signed)
Left message to call Kaitlyn at [phone]. 

## 2013-11-07 NOTE — Telephone Encounter (Signed)
Spoke with patient. Patient states that she has not been able to call and schedule follow up with PCP. States "It is on my list of things to do." Offered to call PCP and schedule follow up appointment for patient. Patient declines offer stating that she has been busy and would be glad to call. States she may want to find PCP closer in [place]. Advised of importance for follow up appointment. Patient states that she will call to schedule asap. Will call back with any further needs, questions, or concerns.   Verner Choleborah S. Leonard CNM, would you like me to follow up with patient in a couple weeks to make sure she was able to schedule PCP appointment?

## 2013-11-08 NOTE — Progress Notes (Signed)
Reviewed personally.  M. Suzanne Esten Dollar, MD.  

## 2013-11-14 ENCOUNTER — Telehealth: Payer: Self-pay | Admitting: Certified Nurse Midwife

## 2013-11-14 NOTE — Telephone Encounter (Signed)
left message for scheduling secretary

## 2013-11-14 NOTE — Telephone Encounter (Signed)
Randa EvensJoanne is calling Martie LeeSabrina back

## 2013-11-25 NOTE — Telephone Encounter (Signed)
Patient has appointment scheduled on 12/24/13 at 1425 at [facility] with Dr.Holwerda.  Routing to provider for final review. Patient agreeable to disposition. Will close encounter

## 2014-04-08 ENCOUNTER — Telehealth: Payer: Self-pay

## 2014-04-08 NOTE — Telephone Encounter (Signed)
Called patient to let her know that I was faxing her the wellness questionnaire sheet that she wanted D leonard to fill out. Pt notified.

## 2014-04-15 ENCOUNTER — Encounter: Payer: Self-pay | Admitting: Nurse Practitioner

## 2014-04-15 ENCOUNTER — Ambulatory Visit (INDEPENDENT_AMBULATORY_CARE_PROVIDER_SITE_OTHER): Payer: BC Managed Care – PPO | Admitting: Nurse Practitioner

## 2014-04-15 VITALS — BP 112/62 | HR 68 | Temp 98.2°F | Resp 16 | Wt 180.0 lb

## 2014-04-15 DIAGNOSIS — R300 Dysuria: Secondary | ICD-10-CM

## 2014-04-15 MED ORDER — CIPROFLOXACIN HCL 500 MG PO TABS: 500.0000 mg | ORAL_TABLET | Freq: Two times a day (BID) | ORAL | Status: DC

## 2014-04-15 NOTE — Progress Notes (Signed)
S:  @53  y.o.Married Caucasian female presents with complaint of UTI. Symptoms began 2 days ago. With symptoms of burning with urination, dysuria, urinary urgency, pressure lower abdomen.  Pertinent negatives include:  having no constitutional symptoms, denying fever, chills, anorexia, or weight loss.  Sexually active yes  Symptoms related to post coital No. She is menopausal.   Does have some vaginal dryness.  Same partner without change. Last UTI documented one  Year ago.  She denies vaginal discharge.  ROS: no weight loss, fever, night sweats and feels well    O Alert, oriented to person, place, and time, normal mood, behavior, speech, dress, motor activity, and thought processes   Healthy and  not in acute distress  Abdomen:  Mild pressure suprapubic  No CVA tenderness  pelvic exam is deferred   Diagnostic Test:    Urinalysis AZO has prevented chemstrip accuracy   urine culture and micro is sent to lab   Assessment:  Medications: ciprofloxacin. Maintain adequate hydration. Follow up if symptoms not improving, and as needed.       Plan:Cipro 500 mg BID for a week Follow with urine culture and micro Lab:TOC if Urine Culture is positive

## 2014-04-15 NOTE — Patient Instructions (Signed)

## 2014-04-15 NOTE — Progress Notes (Signed)
Encounter reviewed by Dr. Ramesha Poster Silva.  

## 2014-04-16 LAB — URINALYSIS, MICROSCOPIC ONLY
Bacteria, UA: NONE SEEN
Casts: NONE SEEN
Squamous Epithelial / LPF: NONE SEEN

## 2014-04-16 LAB — URINE CULTURE
Colony Count: NO GROWTH
Organism ID, Bacteria: NO GROWTH

## 2014-04-17 ENCOUNTER — Encounter: Payer: Self-pay | Admitting: Certified Nurse Midwife

## 2014-04-22 NOTE — Telephone Encounter (Signed)
Order for dx mammogram faxed to [facility] at [phone]. Fax number confirmed by Toni Amendourtney at [facility]. Order sent with cover sheet and confirmation received. Left detailed message on patient's mobile number [phone]. Okay per ROI. Advised order has been sent and to call back with any further questions.  Routing to provider for final review. Patient agreeable to disposition. Will close encounter '

## 2014-04-22 NOTE — Telephone Encounter (Signed)
Order for dx mammogram to Verner Choleborah S. Leonard CNM 's desk for review and signature before fax.

## 2014-05-19 ENCOUNTER — Encounter: Payer: Self-pay | Admitting: Nurse Practitioner

## 2014-10-06 ENCOUNTER — Ambulatory Visit (INDEPENDENT_AMBULATORY_CARE_PROVIDER_SITE_OTHER): Payer: BLUE CROSS/BLUE SHIELD | Admitting: Certified Nurse Midwife

## 2014-10-06 ENCOUNTER — Encounter: Payer: Self-pay | Admitting: Certified Nurse Midwife

## 2014-10-06 VITALS — BP 104/64 | HR 68 | Temp 98.2°F | Resp 16 | Ht 61.75 in | Wt 175.0 lb

## 2014-10-06 DIAGNOSIS — R319 Hematuria, unspecified: Secondary | ICD-10-CM

## 2014-10-06 DIAGNOSIS — N390 Urinary tract infection, site not specified: Secondary | ICD-10-CM | POA: Diagnosis not present

## 2014-10-06 LAB — POCT URINALYSIS DIPSTICK
Bilirubin, UA: NEGATIVE
GLUCOSE UA: NEGATIVE
KETONES UA: NEGATIVE
Leukocytes, UA: NEGATIVE
Nitrite, UA: POSITIVE
Protein, UA: NEGATIVE
Urobilinogen, UA: NEGATIVE
pH, UA: 5

## 2014-10-06 LAB — URINALYSIS, MICROSCOPIC ONLY: Crystals: NONE SEEN

## 2014-10-06 MED ORDER — CIPROFLOXACIN HCL 500 MG PO TABS: 500.0000 mg | ORAL_TABLET | Freq: Two times a day (BID) | ORAL | Status: DC

## 2014-10-06 MED ORDER — PHENAZOPYRIDINE HCL 100 MG PO TABS: 100.0000 mg | ORAL_TABLET | Freq: Three times a day (TID) | ORAL | Status: DC | PRN

## 2014-10-06 NOTE — Patient Instructions (Signed)

## 2014-10-06 NOTE — Progress Notes (Signed)
54 y.o.Married white female [id number] here with complaint of UTI, with onset  2 weeks ago. Patient complaining of urinary frequency/urgency/ and pain with urination. Patient denies fever, chills, nausea or back pain. No new personal products. Patient feels not related to sexual activity. Denies any vaginal symptoms.. Menopausal with some  vaginal dryness uses lubricant if needed. Patient has had adequate water intake.  No other health issues today. Looking for new PCP, has information on.   O: Healthy female WDWN no apparent distress Affect: Normal, orientation x 3 Skin : warm and dry CVAT: slightly positive right Abdomen: positive for suprapubic tenderness  Pelvic exam: External genital area: normal, no lesions Bladder,Urethra tender, Urethral meatus: tender, red Vagina: normal vaginal discharge, normal appearance  Wet prep not taken Cervix: normal, non tender Uterus:normal,non tender Adnexa: normal non tender, no fullness or masses   A: UTI Normal pelvic exam Poct urine--rbc tr, nitrite-positive  P: Reviewed findings of UTI and need for treatment. Rx: Cipro see order Rx Pyridium see order UJW:JXBJYLab:Urine micro, culture Reviewed warning signs and symptoms of UTI and need to advise if occurring. Encouraged to limit soda, tea, and coffee   RV prn

## 2014-10-07 LAB — URINE CULTURE
Colony Count: NO GROWTH
Organism ID, Bacteria: NO GROWTH

## 2014-10-08 ENCOUNTER — Encounter: Payer: Self-pay | Admitting: Certified Nurse Midwife

## 2014-10-09 ENCOUNTER — Ambulatory Visit: Payer: BLUE CROSS/BLUE SHIELD | Admitting: Certified Nurse Midwife

## 2014-10-09 ENCOUNTER — Encounter: Payer: Self-pay | Admitting: Certified Nurse Midwife

## 2014-10-09 NOTE — Telephone Encounter (Signed)
Reviewed with Verner Choleborah S. Leonard CNM. Okay for work in appointment today, however, may need imaging to rule out Kidney Stone. If needs imaging, patient would need to go to [facility]. Patient coming from [place] area for appointment. Patient with negative urine culture and is on Cipro.  Called patient. She states that she feels worse than before when we spoke this morning and is noticing an increase in blood in her urine. Not heavy bleeding per patient. Patient has appointment for today that is scheduled by receptionist.  Patient is advised that when she comes for office visit and has evaluation,additional imaging may be needed  patient would need to have care transferred to [facility] ER for evaluation. Patient states that she does not want [place] [place] come to appointment if possible ER visit is required. Patient would like to stay near her home and continue to monitor symptoms, patient states she has had kidney stones and understands symptoms and will continue to monitor.. Patient requests to cancel appointment that is scheduled at this time. Advised patient to call our office or go to local ER if develops fevers, nausea, vomiting, abdominal pain, unable to pass urine or any other concerning symptom. She verbalizes understanding.   Routing to provider for final review. Patient agreeable to disposition. Will close encounter

## 2014-10-09 NOTE — Telephone Encounter (Addendum)
Pt called and left voicemail regarding this email. Pt requesting work in appointment today.  Pt best: [phone]  *reviewed with nurse - ok to call pt back and schedule for this afternoon. Pt agreeable to 230 appointment.*  Closed encounter

## 2014-10-09 NOTE — Telephone Encounter (Signed)
Spoke with patient. She is feeling "just a little bit better this morning." She is on her way to work. Denies fevers, nausea, vomiting, flank pain or chills.  States she has a hx of kidney stones and interstitial cystitis and has seen [facility] for this in the past.  Patient declines appointment for evaluation. [facility] and return call if is unable to make an appointment.  Patient is advised to call our office if any concerns or increase in symptoms.   Routing to provider for final review. Patient agreeable to disposition. Will close encounter

## 2014-10-15 NOTE — Progress Notes (Signed)
Reviewed personally.  M. Suzanne Kareli Hossain, MD.  

## 2014-11-03 ENCOUNTER — Encounter: Payer: Self-pay | Admitting: Certified Nurse Midwife

## 2014-11-03 ENCOUNTER — Ambulatory Visit (INDEPENDENT_AMBULATORY_CARE_PROVIDER_SITE_OTHER): Payer: BLUE CROSS/BLUE SHIELD | Admitting: Certified Nurse Midwife

## 2014-11-03 VITALS — BP 100/64 | HR 80 | Resp 20 | Ht 62.25 in | Wt 173.0 lb

## 2014-11-03 DIAGNOSIS — Z0000 Encounter for general adult medical examination without abnormal findings: Secondary | ICD-10-CM | POA: Diagnosis not present

## 2014-11-03 DIAGNOSIS — Z124 Encounter for screening for malignant neoplasm of cervix: Secondary | ICD-10-CM | POA: Diagnosis not present

## 2014-11-03 DIAGNOSIS — Z01419 Encounter for gynecological examination (general) (routine) without abnormal findings: Secondary | ICD-10-CM | POA: Diagnosis not present

## 2014-11-03 LAB — POCT URINALYSIS DIPSTICK
Bilirubin, UA: NEGATIVE
Blood, UA: NEGATIVE
GLUCOSE UA: NEGATIVE
Ketones, UA: NEGATIVE
NITRITE UA: NEGATIVE
Protein, UA: NEGATIVE
UROBILINOGEN UA: NEGATIVE
pH, UA: 5

## 2014-11-03 NOTE — Patient Instructions (Signed)

## 2014-11-03 NOTE — Progress Notes (Signed)
54 y.o. G17P1001 Married  Caucasian Fe here for annual exam.  Menopausal, no vaginal bleeding. Patient has noted some vaginal dryness. Not using any OTC products. Patient has decreased meat intake, mainly vegetarian. Doing protein in protein drinks. Patient was seen by urology and has had IC flare in the past one month.  Sees PCP prn. No history of anemia, but feels she is eating very iron. No other health issues. Desires screening labs   LMP 09/28/12          Sexually active: Yes.    The current method of family planning is tubal ligation.    Exercising: Yes.    cardio & walking (bootcamp) Smoker:  no  Health Maintenance: Pap: 10-25-12 neg HPV HR neg MMG: 05-13-14 category 3:probably benign, 0.5 oval mass retroareolar left breast follow up in one year. Colonoscopy:  01-06-12 f/u 49yrs polyp  BMD:   none TDaP:  2013 Labs: Hgb-10.8, Poct urine- wbc tr Self breast exam: done occ   reports that she has never smoked. She does not have any smokeless tobacco history on file. She reports that she drinks about 2.4 oz of alcohol per week. She reports that she does not use illicit drugs.  Past Medical History  Diagnosis Date  . Kidney stones     Past Surgical History  Procedure Laterality Date  . Tubal ligation      BTSP  . Cystectomy  1999    ovarian cystectomy  . Nasal sinus surgery      Current Outpatient Prescriptions  Medication Sig Dispense Refill  . BENICAR 40 MG tablet daily.     . hydrochlorothiazide (HYDRODIURIL) 25 MG tablet daily.     . IBUPROFEN PO Take by mouth as needed.    . meloxicam (MOBIC) 15 MG tablet as needed.  0   No current facility-administered medications for this visit.    Family History  Problem Relation Age of Onset  . Hypertension Mother   . Breast cancer Mother   . Diabetes Paternal Grandmother   . Diabetes Paternal Grandfather     ROS:  Pertinent items are noted in HPI.  Otherwise, a comprehensive ROS was negative.  Exam:   BP 100/64 mmHg   Pulse 80  Resp 20  Ht 5' 2.25" (1.581 m)  Wt 173 lb (78.472 kg)  BMI 31.39 kg/m2  LMP 08/18/2013 Height: 5' 2.25" (158.1 cm) Ht Readings from Last 3 Encounters:  11/03/14 5' 2.25" (1.581 m)  10/06/14 5' 1.75" (1.568 m)  10/29/13 5' 1.75" (1.568 m)    General appearance: alert, cooperative and appears stated age Head: Normocephalic, without obvious abnormality, atraumatic Neck: no adenopathy, supple, symmetrical, trachea midline and thyroid normal to inspection and palpation Lungs: clear to auscultation bilaterally Breasts: normal appearance, no masses or tenderness, No nipple retraction or dimpling, No nipple discharge or bleeding, No axillary or supraclavicular adenopathy Heart: regular rate and rhythm Abdomen: soft, non-tender; no masses,  no organomegaly Extremities: extremities normal, atraumatic, no cyanosis or edema Skin: Skin color, texture, turgor normal. No rashes or lesions Lymph nodes: Cervical, supraclavicular, and axillary nodes normal. No abnormal inguinal nodes palpated Neurologic: Grossly normal   Pelvic: External genitalia:  no lesions              Urethra:  normal appearing urethra with no masses, tenderness or lesions              Bartholin's and Skene's: normal  Vagina: normal appearing vagina with normal color and discharge, no lesions              Cervix: normal , non tender, no lesions              Pap taken: Yes.   Bimanual Exam:  Uterus:  normal size, contour, position, consistency, mobility, non-tender              Adnexa: normal adnexa and no mass, fullness, tenderness               Rectovaginal: Confirms               Anus:  normal sphincter tone, no lesions  Chaperone present: Yes  A:  Well Woman with normal exam  Menopausal  No HRT.  R/O anemia  Screening labs  History of IC with urology management    P:   Reviewed health and wellness pertinent to exam  Aware of need to evaluate if vaginal bleeding to evaluate.  Discussed  need to increase iron in diet. May need iron supplement but will wait for labs  Labs: Iron, IBC, CBC  Labs Hgb A1-c, Lipid panel Vitamin D  Follow with MD as indicated  Pap smear taken today with HPV reflex   counseled on breast self exam, mammography screening, adequate intake of calcium and vitamin D, diet and exercise  return annually or prn  An After Visit Summary was printed and given to the patient.

## 2014-11-04 ENCOUNTER — Other Ambulatory Visit: Payer: Self-pay | Admitting: Certified Nurse Midwife

## 2014-11-04 ENCOUNTER — Telehealth: Payer: Self-pay

## 2014-11-04 DIAGNOSIS — R899 Unspecified abnormal finding in specimens from other organs, systems and tissues: Secondary | ICD-10-CM

## 2014-11-04 LAB — CBC
HCT: 34.8 % — ABNORMAL LOW (ref 36.0–46.0)
Hemoglobin: 11.1 g/dL — ABNORMAL LOW (ref 12.0–15.0)
MCH: 30.9 pg (ref 26.0–34.0)
MCHC: 31.9 g/dL (ref 30.0–36.0)
MCV: 96.9 fL (ref 78.0–100.0)
MPV: 9.8 fL (ref 8.6–12.4)
PLATELETS: 354 10*3/uL (ref 150–400)
RBC: 3.59 MIL/uL — AB (ref 3.87–5.11)
RDW: 13.1 % (ref 11.5–15.5)
WBC: 6.7 10*3/uL (ref 4.0–10.5)

## 2014-11-04 LAB — HEMOGLOBIN A1C
Hgb A1c MFr Bld: 5.9 % — ABNORMAL HIGH (ref <5.7)
Mean Plasma Glucose: 123 mg/dL — ABNORMAL HIGH (ref <117)

## 2014-11-04 LAB — IBC PANEL
%SAT: 30 % (ref 20–55)
TIBC: 341 ug/dL (ref 250–470)
UIBC: 240 ug/dL (ref 125–400)

## 2014-11-04 LAB — VITAMIN D 25 HYDROXY (VIT D DEFICIENCY, FRACTURES): Vit D, 25-Hydroxy: 28 ng/mL — ABNORMAL LOW (ref 30–100)

## 2014-11-04 LAB — LIPID PANEL
CHOLESTEROL: 188 mg/dL (ref 0–200)
HDL: 46 mg/dL (ref >=46)
LDL Cholesterol: 88 mg/dL (ref 0–99)
TRIGLYCERIDES: 269 mg/dL — AB (ref <150)
Total CHOL/HDL Ratio: 4.1 Ratio
VLDL: 54 mg/dL — ABNORMAL HIGH (ref 0–40)

## 2014-11-04 LAB — IRON: Iron: 101 ug/dL (ref 42–145)

## 2014-11-04 LAB — IPS PAP TEST WITH REFLEX TO HPV

## 2014-11-04 NOTE — Progress Notes (Signed)
Reviewed personally.  M. Suzanne Clarrisa Kaylor, MD.  

## 2014-11-04 NOTE — Telephone Encounter (Signed)
-----   Message from Verner Choleborah S Leonard, CNM sent at 11/04/2014  8:09 AM EDT ----- Notify patient that CBC shows slightly low RBC which goes along with low Hemoglobin. Iron is normal and IBC is normal. If not taking multivitamin would suggest start on chewable one/ day  Recheck in 2 months order in please schedule Lipid panel  Cholesterol is normal,  But VLDL is high at 54, normal 1-610-40, triglycerides are elevated again last year 180 now 269, Hgb A-1C is still elevated, no change at 5.9. Needs to be serious about weight loss and carbohydrate control with increase protein in diet. Would recommend she follow up with establishing with PCP. [facility]. She did not do this last year and I stressed at aex this time it was important.  Vitamin D is low per protocol

## 2014-11-04 NOTE — Telephone Encounter (Signed)
Spoke with patient. Advised of results and message as seen below from Verner Choleborah S. Leonard CNM. Patient is agreeable and verbalizes understanding. 2 month follow up lab appointment scheduled for 01/05/2015. Patient is agreeable to date and time. Patient declines referral to PCP at this time. Patient would like to look for provider in [place] who takes her insurance and schedule appointment. Will return call to office if she needs any further assistance.  Routing to provider for final review. Patient agreeable to disposition. Will close encounter

## 2014-11-06 LAB — HEMOGLOBIN, FINGERSTICK: Hemoglobin, fingerstick: 10.8 g/dL — ABNORMAL LOW (ref 12.0–16.0)

## 2014-12-10 DIAGNOSIS — I10 Essential (primary) hypertension: Secondary | ICD-10-CM | POA: Insufficient documentation

## 2014-12-10 DIAGNOSIS — E785 Hyperlipidemia, unspecified: Secondary | ICD-10-CM | POA: Insufficient documentation

## 2014-12-31 ENCOUNTER — Encounter: Payer: Self-pay | Admitting: Nurse Practitioner

## 2015-01-01 ENCOUNTER — Ambulatory Visit (INDEPENDENT_AMBULATORY_CARE_PROVIDER_SITE_OTHER): Payer: BLUE CROSS/BLUE SHIELD | Admitting: Certified Nurse Midwife

## 2015-01-01 ENCOUNTER — Encounter: Payer: Self-pay | Admitting: Certified Nurse Midwife

## 2015-01-01 ENCOUNTER — Telehealth: Payer: Self-pay | Admitting: Emergency Medicine

## 2015-01-01 VITALS — BP 114/70 | HR 72 | Temp 97.4°F | Resp 16 | Ht 62.25 in | Wt 173.0 lb

## 2015-01-01 DIAGNOSIS — N898 Other specified noninflammatory disorders of vagina: Secondary | ICD-10-CM | POA: Diagnosis not present

## 2015-01-01 DIAGNOSIS — N951 Menopausal and female climacteric states: Secondary | ICD-10-CM | POA: Diagnosis not present

## 2015-01-01 DIAGNOSIS — N390 Urinary tract infection, site not specified: Secondary | ICD-10-CM | POA: Diagnosis not present

## 2015-01-01 MED ORDER — ESTROGENS, CONJUGATED 0.625 MG/GM VA CREA: 1.0000 | TOPICAL_CREAM | Freq: Every day | VAGINAL | Status: DC

## 2015-01-01 MED ORDER — NITROFURANTOIN MONOHYD MACRO 100 MG PO CAPS: 100.0000 mg | ORAL_CAPSULE | Freq: Two times a day (BID) | ORAL | Status: DC

## 2015-01-01 NOTE — Telephone Encounter (Signed)
Telephone call for triage created to discuss message with patient and disposition as appropriate.   

## 2015-01-01 NOTE — Patient Instructions (Signed)
Urinary Tract Infection Urinary tract infections (UTIs) can develop anywhere along your urinary tract. Your urinary tract is your body's drainage system for removing wastes and extra water. Your urinary tract includes two kidneys, two ureters, a bladder, and a urethra. Your kidneys are a pair of bean-shaped organs. Each kidney is about the size of your fist. They are located below your ribs, one on each side of your spine. CAUSES Infections are caused by microbes, which are microscopic organisms, including fungi, viruses, and bacteria. These organisms are so small that they can only be seen through a microscope. Bacteria are the microbes that most commonly cause UTIs. SYMPTOMS  Symptoms of UTIs may vary by age and gender of the patient and by the location of the infection. Symptoms in young women typically include a frequent and intense urge to urinate and a painful, burning feeling in the bladder or urethra during urination. Older women and men are more likely to be tired, shaky, and weak and have muscle aches and abdominal pain. A fever may mean the infection is in your kidneys. Other symptoms of a kidney infection include pain in your back or sides below the ribs, nausea, and vomiting. DIAGNOSIS To diagnose a UTI, your caregiver will ask you about your symptoms. Your caregiver also will ask to provide a urine sample. The urine sample will be tested for bacteria and white blood cells. White blood cells are made by your body to help fight infection. TREATMENT  Typically, UTIs can be treated with medication. Because most UTIs are caused by a bacterial infection, they usually can be treated with the use of antibiotics. The choice of antibiotic and length of treatment depend on your symptoms and the type of bacteria causing your infection. HOME CARE INSTRUCTIONS  If you were prescribed antibiotics, take them exactly as your caregiver instructs you. Finish the medication even if you feel better after you  have only taken some of the medication.  Drink enough water and fluids to keep your urine clear or pale yellow.  Avoid caffeine, tea, and carbonated beverages. They tend to irritate your bladder.  Empty your bladder often. Avoid holding urine for long periods of time.  Empty your bladder before and after sexual intercourse.  After a bowel movement, women should cleanse from front to back. Use each tissue only once. SEEK MEDICAL CARE IF:   You have back pain.  You develop a fever.  Your symptoms do not begin to resolve within 3 days. SEEK IMMEDIATE MEDICAL CARE IF:   You have severe back pain or lower abdominal pain.  You develop chills.  You have nausea or vomiting.  You have continued burning or discomfort with urination. MAKE SURE YOU:   Understand these instructions.  Will watch your condition.  Will get help right away if you are not doing well or get worse. Document Released: 04/13/2005 Document Revised: 01/03/2012 Document Reviewed: 08/12/2011 ExitCare Patient Information 2015 [employer]. This information is not intended to replace advice given to you by your health care provider. Make sure you discuss any questions you have with your health care provider. Atrophic Vaginitis Atrophic vaginitis is a problem of low levels of estrogen in women. This problem can happen at any age. It is most common in women who have gone through menopause ("the change").  HOW WILL I KNOW IF I HAVE THIS PROBLEM? You may have:  Trouble with peeing (urinating), such as:  Going to the bathroom often.  A hard time holding your pee   until you reach a bathroom.  Leaking pee.  Having pain when you pee.  Itching or a burning feeling.  Vaginal bleeding and spotting.  Pain during sex.  Dryness of the vagina.  A yellow, bad-smelling fluid (discharge) coming from the vagina. HOW WILL MY DOCTOR CHECK FOR THIS PROBLEM?  During your exam, your doctor will likely find the  problem.  If there is a vaginal fluid, it may be checked for infection. HOW WILL THIS PROBLEM BE TREATED? Keep the vulvar skin as clean as possible. Moisturizers and lubricants can help with some of the symptoms. Estrogen replacement can help. There are 2 ways to take estrogen:  Systemic estrogen gets estrogen to your whole body. It takes many weeks or months before the symptoms get better.  You take an estrogen pill.  You use a skin patch. This is a patch that you put on your skin.  If you still have your uterus, your doctor may ask you to take a hormone. Talk to your doctor about the right medicine for you.  Estrogen cream.  This puts estrogen only at the part of your body where you apply it. The cream is put into the vagina or put on the vulvar skin. For some women, estrogen cream works faster than pills or the patch. CAN ALL WOMEN WITH THIS PROBLEM USE ESTROGEN? No. Women with certain types of cancer, liver problems, or problems with blood clots should not take estrogen. Your doctor can help you decide the best treatment for your symptoms. Document Released: 12/21/2007 Document Revised: 07/09/2013 Document Reviewed: 12/21/2007 ExitCare Patient Information 2015 [employer]. This information is not intended to replace advice given to you by your health care provider. Make sure you discuss any questions you have with your health care provider.  

## 2015-01-01 NOTE — Telephone Encounter (Signed)
Called patient. She states she has been having urinary frequency, dysuria and back pain for 3 days. Patient sent mychart message with request for appointment.  Denies fevers, denies chills, denies nausea and vomiting.  Office visit for 1130 today with Verner Chol CNM scheduled, time per provider. Routing to provider for final review. Patient agreeable to disposition. Will close encounter.   Patient aware provider will review message and nurse will return call if any additional advice or change of disposition.

## 2015-01-01 NOTE — Progress Notes (Signed)
54 y.o.Married g1p1001 here with complaint of vaginal symptoms of dryness with slight itching. Occurs with sexual activity all the time. Scant discharge, but wonders if she has yeast again. Used coconut oil with some change, but doesn't like to insert it. Also complaining of urinary frequency, urgency and pain with urination. Has been taking Uribel with some relief. Denies fever, chills or backache..Onset of symptoms 3 days ago. Denies new personal products. No STD concerns. Menopausal no HRT.   O:Healthy female WDWN Affect: normal, orientation x 3  Exam:Skin warm and dry CVAT negative bilateral Abdomen:positive suprapubic, soft  Inguinal Lymph node: no enlargement or tenderness Pelvic exam: External genital: normal female, no lesions Bartholin/skeen negative Urethral meatus, bladder and urethra tender Vagina: scant  discharge noted.  Affirm taken, slight atrophic changes noted Cervix: normal, non tender, no CMT Uterus: normal, non tender Adnexa:normal, non tender, no masses or fullness noted   Poct urine-neg  A:Normal pelvic exam Mild Atrophic vaginitis with dryness UTI post coital, ? Related to vaginal dryness   P:Discussed findings of UTI and atrophic vaginitis and etiology. Discussed treatment options for dryness with OTC trial again or Rx products with estrogen. Patient has used Premarin in past, and then not needed. Would like to try Premarin again.Discussed risks/benefits and expectations. Recheck in 6 weeks. Rx Premarin see order Warning signs with UTI given and will advise. Rx Macrobid see order with instructions.  Increase water intake and limit soda use. Lab :Urine micro , culture Consider post coital treatment if culture positive.  Rv prn

## 2015-01-02 LAB — URINE CULTURE
COLONY COUNT: NO GROWTH
Organism ID, Bacteria: NO GROWTH

## 2015-01-02 LAB — URINALYSIS, MICROSCOPIC ONLY
Bacteria, UA: NONE SEEN
Casts: NONE SEEN
Crystals: NONE SEEN

## 2015-01-02 LAB — WET PREP BY MOLECULAR PROBE
Candida species: NEGATIVE
GARDNERELLA VAGINALIS: NEGATIVE
Trichomonas vaginosis: NEGATIVE

## 2015-01-02 NOTE — Progress Notes (Signed)
Reviewed personally.  M. Suzanne Dylana Shaw, MD.  

## 2015-01-05 ENCOUNTER — Other Ambulatory Visit: Payer: BLUE CROSS/BLUE SHIELD

## 2015-01-14 ENCOUNTER — Other Ambulatory Visit (INDEPENDENT_AMBULATORY_CARE_PROVIDER_SITE_OTHER): Payer: BLUE CROSS/BLUE SHIELD

## 2015-01-14 ENCOUNTER — Other Ambulatory Visit: Payer: Self-pay

## 2015-01-14 DIAGNOSIS — R7989 Other specified abnormal findings of blood chemistry: Secondary | ICD-10-CM

## 2015-01-14 DIAGNOSIS — R899 Unspecified abnormal finding in specimens from other organs, systems and tissues: Secondary | ICD-10-CM

## 2015-01-14 LAB — CBC
HEMATOCRIT: 36.4 % (ref 36.0–46.0)
Hemoglobin: 12 g/dL (ref 12.0–15.0)
MCH: 31.5 pg (ref 26.0–34.0)
MCHC: 33 g/dL (ref 30.0–36.0)
MCV: 95.5 fL (ref 78.0–100.0)
MPV: 9.2 fL (ref 8.6–12.4)
Platelets: 352 10*3/uL (ref 150–400)
RBC: 3.81 MIL/uL — ABNORMAL LOW (ref 3.87–5.11)
RDW: 12.7 % (ref 11.5–15.5)
WBC: 7.2 10*3/uL (ref 4.0–10.5)

## 2015-01-15 LAB — VITAMIN D 25 HYDROXY (VIT D DEFICIENCY, FRACTURES): Vit D, 25-Hydroxy: 28 ng/mL — ABNORMAL LOW (ref 30–100)

## 2015-03-10 ENCOUNTER — Telehealth: Payer: Self-pay | Admitting: Certified Nurse Midwife

## 2015-03-10 NOTE — Telephone Encounter (Signed)
Spoke with patient regarding message sent via our practice website. Patient states that over the last month or two she has been experiencing increased constipation and difficulty using the restroom. States feels she is not able to go as often and that when she does it is very painful. "I feel like something is blocking it." Denies any abdominal pain or change in stool color. Denies noting any blood in stool. Advised will need to be seen for follow up with GI. Patient is agreeable. Patient states that she has been seen with a gastroenterologist for a colonoscopy, but can not remember who she saw. Advised I will pull her paper chart for review as per our record this was in 2013. Advised will review and speak with Verner Chol CNM regarding referral and scheduling and return call. Patient is agreeable.  Reviewed paper chart. Patient was seen with Dr.Mann on 11/08/2011. Will call Dr.Mann's office tomorrow to see if referral is needed or if patient may call to schedule.

## 2015-03-10 NOTE — Telephone Encounter (Signed)
I would like to schedule appointment with Raven Romero irregular/change in bowel movements/blockage or if she needs to refer me to who did my colonospy that was recommended by her a couple years back.

## 2015-03-11 ENCOUNTER — Telehealth: Payer: Self-pay | Admitting: Nurse Practitioner

## 2015-03-11 NOTE — Telephone Encounter (Signed)
Received this message on 03/10/2015. Please see encounter dated 03/10/2015. Patient is established with Dr.Mann's office and was to call to schedule an appointment.  Routing to provider for final review. Patient agreeable to disposition. Will close encounter.   Patient aware provider will review message and nurse will return call if any additional advice or change of disposition.

## 2015-03-11 NOTE — Telephone Encounter (Signed)
Appointment Request From: Raven Romero      With Provider: [facility]      Preferred Date Range: Any date 03/10/2015 or later      Reason for visit: Office Visit      Comments:   I would like to schedule appointment with Raven Romero irregular/change in bowel movements/blockage or if she needs to refer me to who did my colonospy that was recommended by her a couple years back.      I can be reached at [phone].   Thank    you

## 2015-03-11 NOTE — Telephone Encounter (Signed)
Spoke with patient. Advised I reviewed paper chart and she was seen with Dr.Mann in 2013. [place] and was advised patient can call to schedule appointment. No referral needed. Provided patient with phone number to office. Patient will call to schedule.  Routing to provider for final review. Patient agreeable to disposition. Will close encounter.   Patient aware provider will review message and nurse will return call if any additional advice or change of disposition.

## 2015-03-27 ENCOUNTER — Ambulatory Visit (INDEPENDENT_AMBULATORY_CARE_PROVIDER_SITE_OTHER): Payer: BLUE CROSS/BLUE SHIELD | Admitting: Certified Nurse Midwife

## 2015-03-27 ENCOUNTER — Encounter: Payer: Self-pay | Admitting: Certified Nurse Midwife

## 2015-03-27 VITALS — BP 100/64 | HR 68 | Temp 98.7°F | Resp 16 | Ht 62.25 in | Wt 178.0 lb

## 2015-03-27 DIAGNOSIS — N390 Urinary tract infection, site not specified: Secondary | ICD-10-CM | POA: Diagnosis not present

## 2015-03-27 DIAGNOSIS — N951 Menopausal and female climacteric states: Secondary | ICD-10-CM

## 2015-03-27 DIAGNOSIS — N852 Hypertrophy of uterus: Secondary | ICD-10-CM

## 2015-03-27 DIAGNOSIS — N898 Other specified noninflammatory disorders of vagina: Secondary | ICD-10-CM

## 2015-03-27 DIAGNOSIS — L298 Other pruritus: Secondary | ICD-10-CM

## 2015-03-27 LAB — POCT URINALYSIS DIPSTICK
BILIRUBIN UA: NEGATIVE
Blood, UA: NEGATIVE
GLUCOSE UA: NEGATIVE
Ketones, UA: NEGATIVE
Leukocytes, UA: NEGATIVE
Nitrite, UA: NEGATIVE
Protein, UA: NEGATIVE
UROBILINOGEN UA: NEGATIVE
pH, UA: 5

## 2015-03-27 MED ORDER — ESTROGENS, CONJUGATED 0.625 MG/GM VA CREA: 1.0000 | TOPICAL_CREAM | Freq: Every day | VAGINAL | Status: DC

## 2015-03-27 NOTE — Progress Notes (Signed)
54 y.o.Married white g1p1001 here with complaint of urinary frequency and dysuria at end of stream. Onset of symptoms 2-3 weeks  ago. Denies new personal products or vaginal dryness. Some vaginal discharge with odor and no vaginal itching today.. Used  7 days worth of Monistat OTC. Patient has also has been taking Uribel daily for the past week with relief. Using Premarin cream with some relief. No STD concerns. Patient has IC and watches diet off and on. Has seen urology in 6/16 for and given Uribel for symptomatic relief. No other recommendations given per patient. No other health issues today.   O:Healthy female WDWN Affect: normal, orientation x 3  Exam:Skin: Warm and dry CVAT bilateral negative Abdomen: soft. Negative suprapubic Lymph node: no enlargement or tenderness Pelvic exam: External genital: normal female, no redness, scaling or exudate BUS: negative Bladder and urethral meatus non tender. Vagina: very scant  discharge noted.Slight atrophy noted Affirm taken Cervix: normal, non tender, no CMT Uterus: normal, non tender, but slight nodular feel anterior Adnexa:normal, non tender, no masses or fullness noted  poct urine-neg, pts urine slightly bluish green from urabel  A:Normal pelvic exam ? Fibroid with nodular feel anterior History of IC, probable flare R/O UTI Atrophic vaginitis   P:Discussed findings of normal pelvic exam and possible uterine fibroid and etiology. Discussed need for evaluation by [place] to assess ? Change in feel since aex. Agreeable. Patient will be called with insurance infor and scheduled. Discussed management of IC per Urology recommendations and follow up there. Will send urine to see if bacteria with culture due to history. Aware of warning signs and will advise. Exam not consistent with UTI. Lab: Urine culture Discussed vaginally findings and need for regular use of Premarin cream which will decrease irritation feel. Suggested coconut oil daily in  addition which will help with dryness, which will also help with urinary meatus irritation. Patient will try. Questions addressed. Will treat affirm if needed.   Rv prn

## 2015-03-27 NOTE — Patient Instructions (Addendum)

## 2015-03-28 LAB — URINE CULTURE: Colony Count: 40000

## 2015-03-28 LAB — WET PREP BY MOLECULAR PROBE
Candida species: NEGATIVE
GARDNERELLA VAGINALIS: NEGATIVE
Trichomonas vaginosis: NEGATIVE

## 2015-03-29 NOTE — Progress Notes (Signed)
Reviewed personally.  M. Suzanne Miasia Crabtree, MD.  

## 2015-04-06 ENCOUNTER — Telehealth: Payer: Self-pay | Admitting: Certified Nurse Midwife

## 2015-04-06 DIAGNOSIS — N852 Hypertrophy of uterus: Secondary | ICD-10-CM

## 2015-04-06 NOTE — Telephone Encounter (Signed)
Patient ultrasound ordered for enlarged uterus. Needs ultrasound. Message left to return call to [place] at [phone].

## 2015-04-06 NOTE — Telephone Encounter (Signed)
Called patient to review benefits and schedule for PUS. Patient understands and is agreeable to benefit.   In regards to scheduling, patient states she passed a "fairly large" kidney stone on Saturday night and wondered if that would affect need for ultrasound. Patient prefers to discuss this prior to scheduling. If recommended to move forward with PUS, patient she is available this Thursday 04/09/15.

## 2015-04-07 NOTE — Telephone Encounter (Signed)
Spoke with patient. She is advised that Pelvic ultrasound is recommended to evaluate uterus for possible fibroid or reason for enlargement on exam. Patient is agreeable.  She is scheduled for Pelvic ultrasound at 1030 and consult with Dr. Edward Jolly on 04/09/15 at 1100.  Routing to provider for final review. Patient agreeable to disposition. Will close encounter.

## 2015-04-07 NOTE — Telephone Encounter (Signed)
Patient returning call.

## 2015-04-09 ENCOUNTER — Telehealth: Payer: Self-pay | Admitting: Certified Nurse Midwife

## 2015-04-09 ENCOUNTER — Ambulatory Visit (INDEPENDENT_AMBULATORY_CARE_PROVIDER_SITE_OTHER): Payer: BLUE CROSS/BLUE SHIELD

## 2015-04-09 ENCOUNTER — Ambulatory Visit (INDEPENDENT_AMBULATORY_CARE_PROVIDER_SITE_OTHER): Payer: BLUE CROSS/BLUE SHIELD | Admitting: Obstetrics and Gynecology

## 2015-04-09 ENCOUNTER — Encounter: Payer: Self-pay | Admitting: Obstetrics and Gynecology

## 2015-04-09 VITALS — BP 100/60 | HR 80 | Ht 62.25 in | Wt 174.0 lb

## 2015-04-09 DIAGNOSIS — N852 Hypertrophy of uterus: Secondary | ICD-10-CM

## 2015-04-09 DIAGNOSIS — N841 Polyp of cervix uteri: Secondary | ICD-10-CM | POA: Diagnosis not present

## 2015-04-09 DIAGNOSIS — N63 Unspecified lump in breast: Secondary | ICD-10-CM

## 2015-04-09 DIAGNOSIS — Z803 Family history of malignant neoplasm of breast: Secondary | ICD-10-CM

## 2015-04-09 DIAGNOSIS — N6320 Unspecified lump in the left breast, unspecified quadrant: Secondary | ICD-10-CM

## 2015-04-09 HISTORY — DX: Polyp of cervix uteri: N84.1

## 2015-04-09 NOTE — Telephone Encounter (Signed)
Patient came in for an appointment today and requested an order be sent to [facility] for a diagnostic mammogram by fax at: [phone]. Routing to Dr. Edward Jolly, per [facility], since that is who the patient is seeing today.

## 2015-04-09 NOTE — Progress Notes (Signed)
Patient transferring care from [facility] to the [facility].  Scheduled for 3D Bilateral Diagnostic mammogram with L Breast Ultrasound. Follow up from 05/13/14. Scheduled for 05/18/15 at 1300 at [facility] [place] imaging.

## 2015-04-09 NOTE — Telephone Encounter (Signed)
Patient transferring care from [facility] to the [facility].  Scheduled for 3D Bilateral Diagnostic mammogram with L Breast Ultrasound. Follow up from 05/13/14. Scheduled for 05/18/15 at 1300 at [facility] [place] imaging.  Patient advised she must have records transferred to the breast center prior to appointment. She verbalized understanding. Will follow up as scheduled. Routing to provider for final review. Patient agreeable to disposition. Will close encounter.

## 2015-04-09 NOTE — Telephone Encounter (Signed)
I agree with this diagnostic bilateral mammogram which was recommended one year ago for her follow up.  She should be in recall.  Please assist with this.

## 2015-04-09 NOTE — Progress Notes (Signed)
Subjective  54 y.o. G21P1001 Caucasian female here for pelvic ultrasound for evaluation of uterus.  Routine pelvic exam by Sara Chu on 03/27/15 and nodular feel of the uterus anteriorly. Patient states she passed a large kidney stone recently.   Is being followed for a left breast retroareolar mass which has been stable.  Prior imaging at the [facility]. Mother just diagnosed with breast cancer.  Patient states she was followed for a mass for some time, which ended up being malignant.  Patient now considering biopsy of her own mass.   Pap 11/03/14 - WNL.  No HR HPV done.   Objective  Pelvic ultrasound images and report reviewed with patient.  Uterus - no masses. EMS - 1.95 mm.  Endocervix with 2 polyps, 5 mm and 4 mm.  Ovaries - normal.  Free fluid - no    Assessment  Recent kidney stone passed.  No evidence of fibroids.  I suspect the mass may have been a bladder stone! Endocervical polyps.  Benign pap smear this year.  Left breast mass.  FH of breast cancer.   Plan  Discussion of pelvic anatomy.  Reassurance regarding endocervical polyps which do not need removal.  Patient would like to transfer her breast care to [facility] to the Breast Center for bilateral diagnostic mammogram and possible left breast ultrasound.   Appointment will be made. I discussed she could breast biopsy if she would like confirmation of the histology of the mass.  ___25____ minutes face to face time of which over 50% was spent in counseling.   After visit summary to patient.

## 2015-04-09 NOTE — Telephone Encounter (Signed)
Order for bilateral diagnostic mammogram written and to Dr.Silva for review and signature for fax.

## 2015-04-10 ENCOUNTER — Telehealth: Payer: Self-pay

## 2015-04-10 NOTE — Telephone Encounter (Signed)
Left patient a message letting her know that her wellness program form was complete & was being mailed back to her per her request with her own envelope she filled out. A copy was made for our records.

## 2015-04-10 NOTE — Progress Notes (Signed)
Reviewed results of [place], thank you

## 2015-04-17 ENCOUNTER — Other Ambulatory Visit: Payer: BLUE CROSS/BLUE SHIELD

## 2015-05-13 ENCOUNTER — Other Ambulatory Visit: Payer: BLUE CROSS/BLUE SHIELD

## 2015-05-15 ENCOUNTER — Other Ambulatory Visit: Payer: Self-pay

## 2015-05-18 ENCOUNTER — Other Ambulatory Visit: Payer: Self-pay | Admitting: Obstetrics and Gynecology

## 2015-05-18 ENCOUNTER — Ambulatory Visit
Admission: RE | Admit: 2015-05-18 | Discharge: 2015-05-18 | Disposition: A | Discharge status: Home / Self Care | Payer: BLUE CROSS/BLUE SHIELD | Source: Ambulatory Visit | Attending: Obstetrics and Gynecology | Admitting: Obstetrics and Gynecology

## 2015-05-18 DIAGNOSIS — N6310 Unspecified lump in the right breast, unspecified quadrant: Secondary | ICD-10-CM

## 2015-05-18 DIAGNOSIS — N6320 Unspecified lump in the left breast, unspecified quadrant: Secondary | ICD-10-CM

## 2015-05-19 ENCOUNTER — Other Ambulatory Visit: Payer: Self-pay | Admitting: Obstetrics and Gynecology

## 2015-05-19 DIAGNOSIS — N6310 Unspecified lump in the right breast, unspecified quadrant: Secondary | ICD-10-CM

## 2015-05-20 ENCOUNTER — Ambulatory Visit
Admission: RE | Admit: 2015-05-20 | Discharge: 2015-05-20 | Disposition: A | Discharge status: Home / Self Care | Payer: BLUE CROSS/BLUE SHIELD | Source: Ambulatory Visit | Attending: Obstetrics and Gynecology | Admitting: Obstetrics and Gynecology

## 2015-05-20 DIAGNOSIS — N6310 Unspecified lump in the right breast, unspecified quadrant: Secondary | ICD-10-CM

## 2015-06-01 ENCOUNTER — Encounter: Payer: Self-pay | Admitting: Obstetrics and Gynecology

## 2015-06-01 ENCOUNTER — Telehealth: Payer: Self-pay | Admitting: Emergency Medicine

## 2015-06-01 NOTE — Telephone Encounter (Signed)
Mychart message sent directly to patient with Dr. Rica RecordsSilva's response. Will close encounter.

## 2015-06-01 NOTE — Telephone Encounter (Signed)
Chief Complaint  Patient presents with  . Advice Only    Patient sent mychart message     ===View-only below this line===   ----- Message -----    From: Glenetta HewLAAR,Zahriyah L    Sent: 06/01/2015 11:32 AM EST      To: Melony OverlyBrook A Silva, MD Subject: Non-Urgent Medical Question  Dr. Edward JollySilva, I have been referred to Dr. Harriette Bouillonhomas Cornett, [facility] by Dr. Jean RosenthalJackson with [facility].  Do you recommend Dr. Luisa Hartornett? or do you have other recommendations?  Thank you for your help.

## 2015-06-01 NOTE — Telephone Encounter (Signed)
Telephone call for triage created to discuss message with patient and disposition as appropriate.   

## 2015-06-01 NOTE — Telephone Encounter (Signed)
Dr. Luisa Hartornett does a lot of breast surgery.  OK to see him for consultation.

## 2015-06-01 NOTE — Telephone Encounter (Signed)
Dr. Edward JollySilva,  Patient sent mychart message with request for recommendation.  Can you review and advise

## 2015-06-02 ENCOUNTER — Other Ambulatory Visit: Payer: BLUE CROSS/BLUE SHIELD

## 2015-06-02 ENCOUNTER — Ambulatory Visit: Payer: Self-pay | Admitting: Surgery

## 2015-06-02 DIAGNOSIS — N6310 Unspecified lump in the right breast, unspecified quadrant: Secondary | ICD-10-CM

## 2015-06-02 NOTE — H&P (Signed)
Raven Romero 06/02/2015 2:49 PM Location: [facility] Patient #: [id number] DOB: Feb 11, 1961 Married / Language: English / Race: White Female  History of Present Illness Raven Fus(Newton Frutiger A. Ameilia Rattan MD; 06/02/2015 5:10 PM) Patient words: Patient sent at the request of Dr. Jean RosenthalJackson after an abnormal mammogram. The patient been followed for a left breast mammographic abnormality that resolved. A new mammographic abnormality developed in the right breast and core biopsy was done which showed a complex sclerosing lesion. She denies any history of breast pain, breast mass or nipple discharge. Mother had breast cancer in her 5370s. No other family history.                        Breast, right, needle core biopsy, outer quadrant - COMPLEX SCLEROSING LESION WITH CALCIFICATIONS. - NO MALIGNANCY IDENTIFIED.     CLINICAL DATA: Post right breast tomosynthesis guided biopsy.  EXAM: 3D DIAGNOSTIC RIGHT MAMMOGRAM POST STEREOTACTIC/TOMOSYNTHESIS GUIDED BIOPSY  COMPARISON: Previous exam(s).  FINDINGS: 3D Mammographic images were obtained following stereotactic/tomosynthesis guided biopsy of the area distortion located within the lower outer quadrant of the right breast. The X shaped clip is in appropriate position.  IMPRESSION: Appropriate position of clip following right breast stereotactic/tomosynthesis guided biopsy.  Final Assessment: Post Procedure Mammograms for Marker Placement   Electronically Signed By: Rolla Plateandolph Jackson M.D. On: 05/20/2015 11:49.  The patient is a [age] year old female.   Past Surgical History Raven Romero(Raven Romero, CMA; 06/02/2015 2:49 PM) No pertinent past surgical history  Diagnostic Studies History Raven Romero(Raven Romero; 06/02/2015 2:49 PM) Colonoscopy 1-5 years ago Mammogram within last year Pap Smear 1-5 years ago  Allergies Raven Romero(Raven Romero, CMA; 06/02/2015 2:50 PM) No Known Drug Allergies 06/02/2015  Medication History Raven Romero(Raven  Romero, CMA; 06/02/2015 2:50 PM) HydroCHLOROthiazide (25MG  Tablet, Oral) Active. Premarin (0.625MG /GM Cream, Vaginal) Active. Benicar (40MG  Tablet, Oral) Active. Medications Reconciled  Social History Raven Romero(Raven Romero; 06/02/2015 2:49 PM) Alcohol use Moderate alcohol use. Caffeine use Carbonated beverages, Coffee. No drug use Tobacco use Never smoker.  Family History Raven Romero(Raven Romero; 06/02/2015 2:49 PM) Breast Cancer Mother. Diabetes Mellitus Father. Hypertension Father, Mother.  Pregnancy / Birth History Raven Romero(Raven Romero, CMA; 06/02/2015 2:49 PM) Age at menarche 11 years. Age of menopause 3151-55 Gravida 0 Maternal age 54-25 Para 1     Review of Systems Raven Romero(Raven Romero CMA; 06/02/2015 2:49 PM) Breast Present- Breast Mass. Not Present- Breast Pain, Nipple Discharge and Skin Changes.  Vitals Raven Romero(Raven Romero CMA; 06/02/2015 2:50 PM) 06/02/2015 2:50 PM Weight: 184.8 lb Height: 63in Body Surface Area: 1.87 m Body Mass Index: 32.74 kg/m  Temp.: 97.35F(Temporal)  Pulse: 107 (Regular)  BP: 128/78 (Sitting, Left Arm, Standard)      Physical Exam (Severus Brodzinski A. Ivori Storr MD; 06/02/2015 3:37 PM)  General Mental Status-Alert. General Appearance-Consistent with stated age. Hydration-Well hydrated. Voice-Normal.  Head and Neck Head-normocephalic, atraumatic with no lesions or palpable masses. Trachea-midline. Thyroid Gland Characteristics - normal size and consistency.  Chest and Lung Exam Chest and lung exam reveals -quiet, even and easy respiratory effort with no use of accessory muscles and on auscultation, normal breath sounds, no adventitious sounds and normal vocal resonance. Inspection Chest Wall - Normal. Back - normal.  Breast Breast - Left-Symmetric, Non Tender, No Biopsy scars, no Dimpling, No Inflammation, No Lumpectomy scars, No Mastectomy scars, No Peau d' Orange. Breast - Right-Symmetric, Non Tender, No Biopsy scars, no  Dimpling, No Inflammation, No Lumpectomy scars, No Mastectomy scars, No Peau d' Orange. Breast Lump-No Palpable  Breast Mass.  Cardiovascular Cardiovascular examination reveals -normal heart sounds, regular rate and rhythm with no murmurs and normal pedal pulses bilaterally.  Musculoskeletal Normal Exam - Left-Upper Extremity Strength Normal and Lower Extremity Strength Normal. Normal Exam - Right-Upper Extremity Strength Normal and Lower Extremity Strength Normal.  Lymphatic Axillary  General Axillary Region: Bilateral - Description - Normal. Tenderness - Non Tender.    Assessment & Plan (Rosi Secrist A. Caydan Mctavish MD; 06/02/2015 5:10 PM)  BREAST MASS, RIGHT (N63) Impression: Right breast sclerosing lesion. Recommend excision of right breast sclerosing lesion. Risk malignancies 3% in this circumstance. Risk of lumpectomy include bleeding, infection, seroma, more surgery, use of seed/wire, wound care, cosmetic deformity and the need for other treatments, death , blood clots, death. Pt agrees to proceed.  Current Plans Pt Education - [facility] Breast Biopsy HCI: discussed with patient and provided information. The anatomy and the physiology was discussed. The pathophysiology and natural history of the disease was discussed. Options were discussed and recommendations were made. Technique, risks, benefits, & alternatives were discussed. Risks such as stroke, heart attack, bleeding, indection, death, and other risks discussed. Questions answered. The patient agrees to proceed.

## 2015-06-10 ENCOUNTER — Other Ambulatory Visit: Payer: Self-pay | Admitting: Surgery

## 2015-06-10 DIAGNOSIS — N6310 Unspecified lump in the right breast, unspecified quadrant: Secondary | ICD-10-CM

## 2015-06-18 HISTORY — PX: BREAST EXCISIONAL BIOPSY: SUR124

## 2015-06-22 ENCOUNTER — Other Ambulatory Visit: Payer: Self-pay

## 2015-06-22 ENCOUNTER — Encounter (HOSPITAL_BASED_OUTPATIENT_CLINIC_OR_DEPARTMENT_OTHER): Payer: Self-pay | Admitting: *Deleted

## 2015-06-22 ENCOUNTER — Encounter (HOSPITAL_BASED_OUTPATIENT_CLINIC_OR_DEPARTMENT_OTHER)
Admission: RE | Admit: 2015-06-22 | Discharge: 2015-06-22 | Disposition: A | Discharge status: Home / Self Care | Payer: BLUE CROSS/BLUE SHIELD | Source: Ambulatory Visit | Attending: Surgery | Admitting: Surgery

## 2015-06-22 DIAGNOSIS — Z803 Family history of malignant neoplasm of breast: Secondary | ICD-10-CM | POA: Diagnosis not present

## 2015-06-22 DIAGNOSIS — N6011 Diffuse cystic mastopathy of right breast: Secondary | ICD-10-CM | POA: Diagnosis not present

## 2015-06-22 DIAGNOSIS — N6091 Unspecified benign mammary dysplasia of right breast: Secondary | ICD-10-CM | POA: Diagnosis not present

## 2015-06-22 DIAGNOSIS — N6489 Other specified disorders of breast: Secondary | ICD-10-CM | POA: Diagnosis present

## 2015-06-22 DIAGNOSIS — L905 Scar conditions and fibrosis of skin: Secondary | ICD-10-CM | POA: Diagnosis not present

## 2015-06-22 DIAGNOSIS — I10 Essential (primary) hypertension: Secondary | ICD-10-CM | POA: Diagnosis not present

## 2015-06-22 DIAGNOSIS — Z6832 Body mass index (BMI) 32.0-32.9, adult: Secondary | ICD-10-CM | POA: Diagnosis not present

## 2015-06-22 DIAGNOSIS — E669 Obesity, unspecified: Secondary | ICD-10-CM | POA: Diagnosis not present

## 2015-06-22 LAB — COMPREHENSIVE METABOLIC PANEL
ALT: 18 U/L (ref 14–54)
ANION GAP: 8 (ref 5–15)
AST: 21 U/L (ref 15–41)
Albumin: 3.6 g/dL (ref 3.5–5.0)
Alkaline Phosphatase: 74 U/L (ref 38–126)
BILIRUBIN TOTAL: 0.6 mg/dL (ref 0.3–1.2)
BUN: 15 mg/dL (ref 6–20)
CO2: 28 mmol/L (ref 22–32)
Calcium: 9.6 mg/dL (ref 8.9–10.3)
Chloride: 104 mmol/L (ref 101–111)
Creatinine, Ser: 1.09 mg/dL — ABNORMAL HIGH (ref 0.44–1.00)
GFR, EST NON AFRICAN AMERICAN: 56 mL/min — AB (ref >60)
Glucose, Bld: 158 mg/dL — ABNORMAL HIGH (ref 65–99)
POTASSIUM: 3.4 mmol/L — AB (ref 3.5–5.1)
Sodium: 140 mmol/L (ref 135–145)
TOTAL PROTEIN: 6.3 g/dL — AB (ref 6.5–8.1)

## 2015-06-22 LAB — CBC WITH DIFFERENTIAL/PLATELET
Basophils Absolute: 0 10*3/uL (ref 0.0–0.1)
Basophils Relative: 0 %
EOS PCT: 3 %
Eosinophils Absolute: 0.2 10*3/uL (ref 0.0–0.7)
HEMATOCRIT: 36.1 % (ref 36.0–46.0)
Hemoglobin: 12.2 g/dL (ref 12.0–15.0)
LYMPHS PCT: 27 %
Lymphs Abs: 1.7 10*3/uL (ref 0.7–4.0)
MCH: 32.3 pg (ref 26.0–34.0)
MCHC: 33.8 g/dL (ref 30.0–36.0)
MCV: 95.5 fL (ref 78.0–100.0)
MONO ABS: 0.3 10*3/uL (ref 0.1–1.0)
MONOS PCT: 5 %
NEUTROS ABS: 4.2 10*3/uL (ref 1.7–7.7)
Neutrophils Relative %: 65 %
Platelets: 337 10*3/uL (ref 150–400)
RBC: 3.78 MIL/uL — ABNORMAL LOW (ref 3.87–5.11)
RDW: 12.9 % (ref 11.5–15.5)
WBC: 6.5 10*3/uL (ref 4.0–10.5)

## 2015-06-23 ENCOUNTER — Ambulatory Visit
Admission: RE | Admit: 2015-06-23 | Discharge: 2015-06-23 | Disposition: A | Discharge status: Home / Self Care | Payer: BLUE CROSS/BLUE SHIELD | Source: Ambulatory Visit | Attending: Surgery | Admitting: Surgery

## 2015-06-23 DIAGNOSIS — N6310 Unspecified lump in the right breast, unspecified quadrant: Secondary | ICD-10-CM

## 2015-06-25 ENCOUNTER — Encounter (HOSPITAL_BASED_OUTPATIENT_CLINIC_OR_DEPARTMENT_OTHER): Payer: Self-pay | Admitting: Anesthesiology

## 2015-06-25 ENCOUNTER — Ambulatory Visit
Admission: RE | Admit: 2015-06-25 | Discharge: 2015-06-25 | Disposition: A | Discharge status: Home / Self Care | Payer: BLUE CROSS/BLUE SHIELD | Source: Ambulatory Visit | Attending: Surgery | Admitting: Surgery

## 2015-06-25 ENCOUNTER — Ambulatory Visit (HOSPITAL_BASED_OUTPATIENT_CLINIC_OR_DEPARTMENT_OTHER): Payer: BLUE CROSS/BLUE SHIELD | Admitting: Anesthesiology

## 2015-06-25 ENCOUNTER — Encounter (HOSPITAL_BASED_OUTPATIENT_CLINIC_OR_DEPARTMENT_OTHER)
Admission: RE | Disposition: A | Discharge status: Home / Self Care | Payer: Self-pay | Source: Ambulatory Visit | Attending: Surgery

## 2015-06-25 ENCOUNTER — Ambulatory Visit (HOSPITAL_BASED_OUTPATIENT_CLINIC_OR_DEPARTMENT_OTHER)
Admission: RE | Admit: 2015-06-25 | Discharge: 2015-06-25 | Disposition: A | Discharge status: Home / Self Care | Payer: BLUE CROSS/BLUE SHIELD | Source: Ambulatory Visit | Attending: Surgery | Admitting: Surgery

## 2015-06-25 DIAGNOSIS — N6489 Other specified disorders of breast: Secondary | ICD-10-CM | POA: Insufficient documentation

## 2015-06-25 DIAGNOSIS — L905 Scar conditions and fibrosis of skin: Secondary | ICD-10-CM | POA: Insufficient documentation

## 2015-06-25 DIAGNOSIS — N6091 Unspecified benign mammary dysplasia of right breast: Secondary | ICD-10-CM | POA: Insufficient documentation

## 2015-06-25 DIAGNOSIS — N6310 Unspecified lump in the right breast, unspecified quadrant: Secondary | ICD-10-CM

## 2015-06-25 DIAGNOSIS — N6011 Diffuse cystic mastopathy of right breast: Secondary | ICD-10-CM | POA: Insufficient documentation

## 2015-06-25 DIAGNOSIS — E669 Obesity, unspecified: Secondary | ICD-10-CM | POA: Insufficient documentation

## 2015-06-25 DIAGNOSIS — Z6832 Body mass index (BMI) 32.0-32.9, adult: Secondary | ICD-10-CM | POA: Insufficient documentation

## 2015-06-25 DIAGNOSIS — Z803 Family history of malignant neoplasm of breast: Secondary | ICD-10-CM | POA: Insufficient documentation

## 2015-06-25 DIAGNOSIS — I10 Essential (primary) hypertension: Secondary | ICD-10-CM | POA: Insufficient documentation

## 2015-06-25 HISTORY — DX: Unspecified lump in the right breast, unspecified quadrant: N63.10

## 2015-06-25 HISTORY — DX: Essential (primary) hypertension: I10

## 2015-06-25 HISTORY — PX: BREAST LUMPECTOMY WITH RADIOACTIVE SEED LOCALIZATION: SHX6424

## 2015-06-25 SURGERY — BREAST LUMPECTOMY WITH RADIOACTIVE SEED LOCALIZATION
Anesthesia: General | Site: Breast | Laterality: Right

## 2015-06-25 MED ORDER — BUPIVACAINE-EPINEPHRINE (PF) 0.25% -1:200000 IJ SOLN
INTRAMUSCULAR | Status: AC
  Filled 2015-06-25: qty 30

## 2015-06-25 MED ORDER — CHLORHEXIDINE GLUCONATE 4 % EX LIQD: 1.0000 "application " | Freq: Once | CUTANEOUS | Status: DC

## 2015-06-25 MED ORDER — MIDAZOLAM HCL 2 MG/2ML IJ SOLN
INTRAMUSCULAR | Status: AC
  Filled 2015-06-25: qty 2

## 2015-06-25 MED ORDER — ONDANSETRON HCL 4 MG/2ML IJ SOLN
INTRAMUSCULAR | Status: AC
  Filled 2015-06-25: qty 2

## 2015-06-25 MED ORDER — GLYCOPYRROLATE 0.2 MG/ML IJ SOLN
INTRAMUSCULAR | Status: AC
  Filled 2015-06-25: qty 1

## 2015-06-25 MED ORDER — ATROPINE SULFATE 0.4 MG/ML IJ SOLN
INTRAMUSCULAR | Status: AC
  Filled 2015-06-25: qty 1

## 2015-06-25 MED ORDER — CEFAZOLIN SODIUM-DEXTROSE 2-3 GM-% IV SOLR
INTRAVENOUS | Status: AC
  Filled 2015-06-25: qty 50

## 2015-06-25 MED ORDER — FENTANYL CITRATE (PF) 100 MCG/2ML IJ SOLN
INTRAMUSCULAR | Status: DC | PRN
  Administered 2015-06-25: 100 ug via INTRAVENOUS

## 2015-06-25 MED ORDER — LIDOCAINE HCL (CARDIAC) 20 MG/ML IV SOLN
INTRAVENOUS | Status: DC | PRN
  Administered 2015-06-25: 50 mg via INTRAVENOUS

## 2015-06-25 MED ORDER — DEXTROSE 5 % IV SOLN
3.0000 g | INTRAVENOUS | Status: AC
  Administered 2015-06-25: 2 g via INTRAVENOUS

## 2015-06-25 MED ORDER — GLYCOPYRROLATE 0.2 MG/ML IJ SOLN: 0.2000 mg | Freq: Once | INTRAMUSCULAR | Status: DC | PRN

## 2015-06-25 MED ORDER — PHENYLEPHRINE HCL 10 MG/ML IJ SOLN
INTRAMUSCULAR | Status: AC
  Filled 2015-06-25: qty 1

## 2015-06-25 MED ORDER — MIDAZOLAM HCL 2 MG/2ML IJ SOLN: 1.0000 mg | INTRAMUSCULAR | Status: DC | PRN

## 2015-06-25 MED ORDER — EPHEDRINE SULFATE 50 MG/ML IJ SOLN
INTRAMUSCULAR | Status: AC
  Filled 2015-06-25: qty 1

## 2015-06-25 MED ORDER — FENTANYL CITRATE (PF) 100 MCG/2ML IJ SOLN
INTRAMUSCULAR | Status: AC
  Filled 2015-06-25: qty 2

## 2015-06-25 MED ORDER — DEXAMETHASONE SODIUM PHOSPHATE 4 MG/ML IJ SOLN
INTRAMUSCULAR | Status: DC | PRN
  Administered 2015-06-25: 10 mg via INTRAVENOUS

## 2015-06-25 MED ORDER — LIDOCAINE HCL (CARDIAC) 20 MG/ML IV SOLN
INTRAVENOUS | Status: AC
  Filled 2015-06-25: qty 5

## 2015-06-25 MED ORDER — PROPOFOL 10 MG/ML IV BOLUS
INTRAVENOUS | Status: AC
  Filled 2015-06-25: qty 40

## 2015-06-25 MED ORDER — DEXAMETHASONE SODIUM PHOSPHATE 10 MG/ML IJ SOLN
INTRAMUSCULAR | Status: AC
  Filled 2015-06-25: qty 1

## 2015-06-25 MED ORDER — OXYCODONE HCL 5 MG PO TABS
5.0000 mg | ORAL_TABLET | Freq: Once | ORAL | Status: AC | PRN
  Administered 2015-06-25: 5 mg via ORAL

## 2015-06-25 MED ORDER — LACTATED RINGERS IV SOLN
INTRAVENOUS | Status: DC
  Administered 2015-06-25 (×2): via INTRAVENOUS

## 2015-06-25 MED ORDER — EPHEDRINE SULFATE 50 MG/ML IJ SOLN
INTRAMUSCULAR | Status: DC | PRN
  Administered 2015-06-25: 10 mg via INTRAVENOUS

## 2015-06-25 MED ORDER — FENTANYL CITRATE (PF) 100 MCG/2ML IJ SOLN
25.0000 ug | INTRAMUSCULAR | Status: DC | PRN
  Administered 2015-06-25 (×2): 50 ug via INTRAVENOUS

## 2015-06-25 MED ORDER — TRAMADOL HCL 50 MG PO TABS: 50.0000 mg | ORAL_TABLET | Freq: Four times a day (QID) | ORAL | Status: DC | PRN

## 2015-06-25 MED ORDER — MIDAZOLAM HCL 5 MG/5ML IJ SOLN
INTRAMUSCULAR | Status: DC | PRN
  Administered 2015-06-25: 2 mg via INTRAVENOUS

## 2015-06-25 MED ORDER — SCOPOLAMINE 1 MG/3DAYS TD PT72: 1.0000 | MEDICATED_PATCH | Freq: Once | TRANSDERMAL | Status: DC

## 2015-06-25 MED ORDER — PHENYLEPHRINE HCL 10 MG/ML IJ SOLN
INTRAMUSCULAR | Status: DC | PRN
  Administered 2015-06-25 (×2): 40 ug via INTRAVENOUS

## 2015-06-25 MED ORDER — OXYCODONE HCL 5 MG PO TABS
ORAL_TABLET | ORAL | Status: AC
  Filled 2015-06-25: qty 1

## 2015-06-25 MED ORDER — SUCCINYLCHOLINE CHLORIDE 20 MG/ML IJ SOLN
INTRAMUSCULAR | Status: AC
  Filled 2015-06-25: qty 1

## 2015-06-25 MED ORDER — BUPIVACAINE-EPINEPHRINE (PF) 0.25% -1:200000 IJ SOLN
INTRAMUSCULAR | Status: DC | PRN
  Administered 2015-06-25: 10 mL

## 2015-06-25 MED ORDER — HYDROCODONE-ACETAMINOPHEN 5-325 MG PO TABS: 1.0000 | ORAL_TABLET | Freq: Four times a day (QID) | ORAL | Status: DC | PRN

## 2015-06-25 MED ORDER — PROPOFOL 10 MG/ML IV BOLUS
INTRAVENOUS | Status: DC | PRN
  Administered 2015-06-25: 200 mg via INTRAVENOUS

## 2015-06-25 MED ORDER — FENTANYL CITRATE (PF) 100 MCG/2ML IJ SOLN: 50.0000 ug | INTRAMUSCULAR | Status: DC | PRN

## 2015-06-25 MED ORDER — ONDANSETRON HCL 4 MG/2ML IJ SOLN: 4.0000 mg | Freq: Once | INTRAMUSCULAR | Status: DC | PRN

## 2015-06-25 SURGICAL SUPPLY — 52 items
APPLIER CLIP 9.375 MED OPEN (MISCELLANEOUS)
APR CLP MED 9.3 20 MLT OPN (MISCELLANEOUS)
BINDER BREAST LRG (GAUZE/BANDAGES/DRESSINGS) IMPLANT
BINDER BREAST MEDIUM (GAUZE/BANDAGES/DRESSINGS) IMPLANT
BINDER BREAST XLRG (GAUZE/BANDAGES/DRESSINGS) ×1 IMPLANT
BINDER BREAST XXLRG (GAUZE/BANDAGES/DRESSINGS) IMPLANT
BLADE SURG 15 STRL LF DISP TIS (BLADE) ×1 IMPLANT
BLADE SURG 15 STRL SS (BLADE) ×2
CANISTER SUC SOCK COL 7IN (MISCELLANEOUS) ×1 IMPLANT
CANISTER SUCT 1200ML W/VALVE (MISCELLANEOUS) IMPLANT
CHLORAPREP W/TINT 26ML (MISCELLANEOUS) ×2 IMPLANT
CLIP APPLIE 9.375 MED OPEN (MISCELLANEOUS) IMPLANT
COVER BACK TABLE 60X90IN (DRAPES) ×2 IMPLANT
COVER MAYO STAND STRL (DRAPES) ×2 IMPLANT
COVER PROBE W GEL 5X96 (DRAPES) ×2 IMPLANT
DECANTER SPIKE VIAL GLASS SM (MISCELLANEOUS) IMPLANT
DEVICE DUBIN W/COMP PLATE 8390 (MISCELLANEOUS) ×2 IMPLANT
DRAPE LAPAROSCOPIC ABDOMINAL (DRAPES) IMPLANT
DRAPE LAPAROTOMY 100X72 PEDS (DRAPES) ×2 IMPLANT
DRAPE UTILITY XL STRL (DRAPES) ×2 IMPLANT
ELECT COATED BLADE 2.86 ST (ELECTRODE) ×2 IMPLANT
ELECT REM PT RETURN 9FT ADLT (ELECTROSURGICAL) ×2
ELECTRODE REM PT RTRN 9FT ADLT (ELECTROSURGICAL) ×1 IMPLANT
GLOVE BIO SURGEON STRL SZ 6.5 (GLOVE) ×1 IMPLANT
GLOVE BIOGEL PI IND STRL 7.0 (GLOVE) IMPLANT
GLOVE BIOGEL PI IND STRL 7.5 (GLOVE) IMPLANT
GLOVE BIOGEL PI IND STRL 8 (GLOVE) ×1 IMPLANT
GLOVE BIOGEL PI INDICATOR 7.0 (GLOVE) ×1
GLOVE BIOGEL PI INDICATOR 7.5 (GLOVE) ×1
GLOVE BIOGEL PI INDICATOR 8 (GLOVE) ×1
GLOVE ECLIPSE 8.0 STRL XLNG CF (GLOVE) ×2 IMPLANT
GOWN STRL REUS W/ TWL LRG LVL3 (GOWN DISPOSABLE) ×2 IMPLANT
GOWN STRL REUS W/TWL LRG LVL3 (GOWN DISPOSABLE) ×4
HEMOSTAT SNOW SURGICEL 2X4 (HEMOSTASIS) IMPLANT
ILLUMINATOR WAVEGUIDE N/F (MISCELLANEOUS) ×1 IMPLANT
KIT MARKER MARGIN INK (KITS) ×2 IMPLANT
LIQUID BAND (GAUZE/BANDAGES/DRESSINGS) ×2 IMPLANT
NDL HYPO 25X1 1.5 SAFETY (NEEDLE) ×1 IMPLANT
NEEDLE HYPO 25X1 1.5 SAFETY (NEEDLE) ×2 IMPLANT
NS IRRIG 1000ML POUR BTL (IV SOLUTION) ×1 IMPLANT
PACK BASIN DAY SURGERY FS (CUSTOM PROCEDURE TRAY) ×2 IMPLANT
PENCIL BUTTON HOLSTER BLD 10FT (ELECTRODE) ×2 IMPLANT
SLEEVE SCD COMPRESS KNEE MED (MISCELLANEOUS) ×2 IMPLANT
SPONGE LAP 4X18 X RAY DECT (DISPOSABLE) ×2 IMPLANT
SUT MNCRL AB 4-0 PS2 18 (SUTURE) ×2 IMPLANT
SUT SILK 2 0 SH (SUTURE) IMPLANT
SUT VICRYL 3-0 CR8 SH (SUTURE) ×2 IMPLANT
SYR CONTROL 10ML LL (SYRINGE) ×2 IMPLANT
TOWEL OR 17X24 6PK STRL BLUE (TOWEL DISPOSABLE) ×2 IMPLANT
TOWEL OR NON WOVEN STRL DISP B (DISPOSABLE) ×2 IMPLANT
TUBE CONNECTING 20X1/4 (TUBING) IMPLANT
YANKAUER SUCT BULB TIP NO VENT (SUCTIONS) IMPLANT

## 2015-06-25 NOTE — Op Note (Signed)
Preoperative  diagnosis: Right breast sclerosing lesion  Postoperative diagnosis: Same  Procedure: Right breast seed localized lumpectomy  Surgeon: Erroll Luna M.D.  Anesthesia: LMA with 0.25% Sensorcaine local  EBL: Minimal  Specimen: Right breast tissue with clip and seed verify the radiograph  Drains: None  Indications for procedure: Patient presents for right breast seed localized lumpectomy for a sclerosing lesion was found on screening mammography and biopsy. Small risk of malignancy with this lesion discussed with the patient. Alternatives to surgery discuss.   The procedure has been discussed with the patient. Alternatives to surgery have been discussed with the patient.  Risks of surgery include bleeding,  Infection,  Seroma formation, death,  and the need for further surgery.   The patient understands and wishes to proceed.  Description of procedure: The patient was met in the holding area and questions are answered. Right breast was marked and see location was verified by the neoprobe. Questions are answered. The patient was taken back to the operating room and placed supine on the operating room table. After induction of LMA anesthesia, the right breast is prepped and draped in a sterile fashion. Timeout was done to verify proper patient and procedure. Curvilinear incision was made along the lateral border of the nipple areolar complex. Neoprobe was used and dissection was carried down around the location of the seating clip and the tissue was removed with grossly negative margins. Hemostasis was achieved. Radiograph revealed the clip and the scene to be in the specimen. Incision was closed with 3-0 Vicryl and 4-0 Monocryl. Liquid adhesive applied. All final counts found to be correct. The patient was awoke extubated taken to recovery in satisfactory condition.

## 2015-06-25 NOTE — Discharge Instructions (Signed)
[facility] °Office Phone Number [phone] ° °BREAST BIOPSY/ PARTIAL MASTECTOMY: POST OP INSTRUCTIONS ° °Always review your discharge instruction sheet given to you by the facility where your surgery was performed. ° °IF YOU HAVE DISABILITY OR FAMILY LEAVE FORMS, YOU MUST BRING THEM TO THE OFFICE FOR PROCESSING.  DO NOT GIVE THEM TO YOUR DOCTOR. ° °1. A prescription for pain medication may be given to you upon discharge.  Take your pain medication as prescribed, if needed.  If narcotic pain medicine is not needed, then you may take acetaminophen (Tylenol) or ibuprofen (Advil) as needed. °2. Take your usually prescribed medications unless otherwise directed °3. If you need a refill on your pain medication, please contact your pharmacy.  They will contact our office to request authorization.  Prescriptions will not be filled after 5pm or on week-ends. °4. You should eat very light the first 24 hours after surgery, such as soup, crackers, pudding, etc.  Resume your normal diet the day after surgery. °5. Most patients will experience some swelling and bruising in the breast.  Ice packs and a good support bra will help.  Swelling and bruising can take several days to resolve.  °6. It is common to experience some constipation if taking pain medication after surgery.  Increasing fluid intake and taking a stool softener will usually help or prevent this problem from occurring.  A mild laxative (Milk of Magnesia or Miralax) should be taken according to package directions if there are no bowel movements after 48 hours. °7. Unless discharge instructions indicate otherwise, you may remove your bandages 24-48 hours after surgery, and you may shower at that time.  You may have steri-strips (small skin tapes) in place directly over the incision.  These strips should be left on the skin for 7-10 days.  If your surgeon used skin glue on the incision, you may shower in 24 hours.  The glue will flake off over the  next 2-3 weeks.  Any sutures or staples will be removed at the office during your follow-up visit. °8. ACTIVITIES:  You may resume regular daily activities (gradually increasing) beginning the next day.  Wearing a good support bra or sports bra minimizes pain and swelling.  You may have sexual intercourse when it is comfortable. °a. You may drive when you no longer are taking prescription pain medication, you can comfortably wear a seatbelt, and you can safely maneuver your car and apply brakes. °b. RETURN TO WORK:  ______________________________________________________________________________________ °9. You should see your doctor in the office for a follow-up appointment approximately two weeks after your surgery.  Your doctor’s nurse will typically make your follow-up appointment when she calls you with your pathology report.  Expect your pathology report 2-3 business days after your surgery.  You may call to check if you do not hear from us after three days. °10. OTHER INSTRUCTIONS: _______________________________________________________________________________________________ _____________________________________________________________________________________________________________________________________ °_____________________________________________________________________________________________________________________________________ °_____________________________________________________________________________________________________________________________________ ° °WHEN TO CALL YOUR DOCTOR: °1. Fever over 101.0 °2. Nausea and/or vomiting. °3. Extreme swelling or bruising. °4. Continued bleeding from incision. °5. Increased pain, redness, or drainage from the incision. ° °The clinic staff is available to answer your questions during regular business hours.  Please don’t hesitate to call and ask to speak to one of the nurses for clinical concerns.  If you have a medical emergency, go to the nearest  emergency room or call 911.  A surgeon from [facility] is always on call at the hospital. ° °For further questions, please visit [email or web address]  ° ° ° °  Post Anesthesia Home Care Instructions ° °Activity: °Get plenty of rest for the remainder of the day. A responsible adult should stay with you for 24 hours following the procedure.  °For the next 24 hours, DO NOT: °-Drive a car °-Operate machinery °-Drink alcoholic beverages °-Take any medication unless instructed by your physician °-Make any legal decisions or sign important papers. ° °Meals: °Start with liquid foods such as gelatin or soup. Progress to regular foods as tolerated. Avoid greasy, spicy, heavy foods. If nausea and/or vomiting occur, drink only clear liquids until the nausea and/or vomiting subsides. Call your physician if vomiting continues. ° °Special Instructions/Symptoms: °Your throat may feel dry or sore from the anesthesia or the breathing tube placed in your throat during surgery. If this causes discomfort, gargle with warm salt water. The discomfort should disappear within 24 hours. ° °If you had a scopolamine patch placed behind your ear for the management of post- operative nausea and/or vomiting: ° °1. The medication in the patch is effective for 72 hours, after which it should be removed.  Wrap patch in a tissue and discard in the trash. Wash hands thoroughly with soap and water. °2. You may remove the patch earlier than 72 hours if you experience unpleasant side effects which may include dry mouth, dizziness or visual disturbances. °3. Avoid touching the patch. Wash your hands with soap and water after contact with the patch. ° ° °Call your surgeon if you experience:  ° °1.  Fever over 101.0. °2.  Inability to urinate. °3.  Nausea and/or vomiting. °4.  Extreme swelling or bruising at the surgical site. °5.  Continued bleeding from the incision. °6.  Increased pain, redness or drainage from the incision. °7.   Problems related to your pain medication. °8. Any change in color, movement and/or sensation °9. Any problems and/or concerns °  ° °

## 2015-06-25 NOTE — Transfer of Care (Signed)
Immediate Anesthesia Transfer of Care Note  Patient: Raven Romero  Procedure(s) Performed: Procedure(s): RIGHT BREAST SEED LOCALIZATION LUMPECTOMY  (Right)  Patient Location: PACU  Anesthesia Type:General  Level of Consciousness: awake, alert  and patient cooperative  Airway & Oxygen Therapy: Patient Spontanous Breathing and Patient connected to face mask oxygen  Post-op Assessment: Report given to RN and Post -op Vital signs reviewed and stable  Post vital signs: Reviewed and stable  Last Vitals:  Filed Vitals:   06/25/15 1205 06/25/15 1401  BP: 108/76   Pulse: 87 90  Temp: 36.9 C   Resp: 18     Complications: No apparent anesthesia complications

## 2015-06-25 NOTE — H&P (Signed)
H&P   Raven Romero (MR# [id number])      H&P Info    Author Note Status Last Update User Last Update Date/Time   Harriette Bouillon, MD Signed Harriette Bouillon, MD 06/02/2015 5:11 PM    H&P    Expand All Collapse All   Raven Romero 06/02/2015 2:49 PM Location: [facility] Patient #: [id number] DOB: 08-04-1960 Married / Language: English / Race: White Female  History of Present Illness Raven Romero A. Raven Busenbark MD; 06/02/2015 5:10 PM) Patient words: Patient sent at the request of Dr. Jean Romero after an abnormal mammogram. The patient been followed for a left breast mammographic abnormality that resolved. A new mammographic abnormality developed in the right breast and core biopsy was done which showed a complex sclerosing lesion. She denies any history of breast pain, breast mass or nipple discharge. Mother had breast cancer in her 25s. No other family history.                        Breast, right, needle core biopsy, outer quadrant - COMPLEX SCLEROSING LESION WITH CALCIFICATIONS. - NO MALIGNANCY IDENTIFIED.     CLINICAL DATA: Post right breast tomosynthesis guided biopsy.  EXAM: 3D DIAGNOSTIC RIGHT MAMMOGRAM POST STEREOTACTIC/TOMOSYNTHESIS GUIDED BIOPSY  COMPARISON: Previous exam(s).  FINDINGS: 3D Mammographic images were obtained following stereotactic/tomosynthesis guided biopsy of the area distortion located within the lower outer quadrant of the right breast. The X shaped clip is in appropriate position.  IMPRESSION: Appropriate position of clip following right breast stereotactic/tomosynthesis guided biopsy.  Final Assessment: Post Procedure Mammograms for Marker Placement   Electronically Signed By: Raven Romero M.D. On: 05/20/2015 11:49.  The patient is a 54 year old female.   Past Surgical History Raven Romero, CMA; 06/02/2015 2:49 PM) No pertinent past surgical history  Diagnostic Studies History Raven Romero, [place]; 06/02/2015 2:49 PM) Colonoscopy 1-5 years ago Mammogram within last year Pap Smear 1-5 years ago  Allergies Raven Romero, CMA; 06/02/2015 2:50 PM) No Known Drug Allergies 06/02/2015  Medication History Raven Romero, CMA; 06/02/2015 2:50 PM) HydroCHLOROthiazide (  Tablet, Oral) Active. Premarin (0.625MG /GM Cream, Vaginal) Active. Benicar (  Tablet, Oral) Active. Medications Reconciled  Social History Raven Romero, [place]; 06/02/2015 2:49 PM) Alcohol use Moderate alcohol use. Caffeine use Carbonated beverages, Coffee. No drug use Tobacco use Never smoker.  Family History Raven Romero, [place]; 06/02/2015 2:49 PM) Breast Cancer Mother. Diabetes Mellitus Father. Hypertension Father, Mother.  Pregnancy / Birth History Raven Romero, CMA; 06/02/2015 2:49 PM) Age at menarche 11 years. Age of menopause 66-55 Gravida 0 Maternal age 24-25 Para 1     Review of Systems Raven Romero CMA; 06/02/2015 2:49 PM) Breast Present- Breast Mass. Not Present- Breast Pain, Nipple Discharge and Skin Changes.  Vitals Raven Romero CMA; 06/02/2015 2:50 PM) 06/02/2015 2:50 PM Weight: 184.8 lb Height: 63in Body Surface Area: 1.87 m Body Mass Index: 32.74 kg/m  Temp.: 97.87F(Temporal)  Pulse: 107 (Regular)  BP: 128/78 (Sitting, Left Arm, Standard)      Physical Exam (Raven Romero A. Dimitrios Balestrieri MD; 06/02/2015 3:37 PM)  General Mental Status-Alert. General Appearance-Consistent with stated age. Hydration-Well hydrated. Voice-Normal.  Head and Neck Head-normocephalic, atraumatic with no lesions or palpable masses. Trachea-midline. Thyroid Gland Characteristics - normal size and consistency.  Chest and Lung Exam Chest and lung exam reveals -quiet, even and easy respiratory effort with no use of accessory muscles and on auscultation, normal breath sounds, no adventitious sounds and normal vocal resonance. Inspection Chest Wall - Normal.  Back - normal.  Breast Breast - Left-Symmetric, Non Tender, No Biopsy scars, no Dimpling, No Inflammation, No Lumpectomy scars, No Mastectomy scars, No Peau d' Orange. Breast - Right-Symmetric, Non Tender, No Biopsy scars, no Dimpling, No Inflammation, No Lumpectomy scars, No Mastectomy scars, No Peau d' Orange. Breast Lump-No Palpable Breast Mass.  Cardiovascular Cardiovascular examination reveals -normal heart sounds, regular rate and rhythm with no murmurs and normal pedal pulses bilaterally.  Musculoskeletal Normal Exam - Left-Upper Extremity Strength Normal and Lower Extremity Strength Normal. Normal Exam - Right-Upper Extremity Strength Normal and Lower Extremity Strength Normal.  Lymphatic Axillary  General Axillary Region: Bilateral - Description - Normal. Tenderness - Non Tender.    Assessment & Plan (Raven Melena A. Johnhenry Tippin MD; 06/02/2015 5:10 PM)  BREAST MASS, RIGHT (N63) Impression: Right breast sclerosing lesion. Recommend excision of right breast sclerosing lesion. Risk malignancies 3% in this circumstance. Risk of lumpectomy include bleeding, infection, seroma, more surgery, use of seed/wire, wound care, cosmetic deformity and the need for other treatments, death , blood clots, death. Pt agrees to proceed.  Current Plans Pt Education - [facility] Breast Biopsy HCI: discussed with patient and provided information. The anatomy and the physiology was discussed. The pathophysiology and natural history of the disease was discussed. Options were discussed and recommendations were made. Technique, risks, benefits, & alternatives were discussed. Risks such as stroke, heart attack, bleeding, indection, death, and other risks discussed. Questions answered. The patient agrees to proceed.

## 2015-06-25 NOTE — Anesthesia Postprocedure Evaluation (Signed)
Anesthesia Post Note  Patient: Raven Romero  Procedure(s) Performed: Procedure(s) (LRB): RIGHT BREAST SEED LOCALIZATION LUMPECTOMY  (Right)  Patient location during evaluation: PACU Anesthesia Type: General Level of consciousness: awake and alert Pain management: pain level controlled Vital Signs Assessment: post-procedure vital signs reviewed and stable Respiratory status: spontaneous breathing, nonlabored ventilation, respiratory function stable and patient connected to nasal cannula oxygen Cardiovascular status: blood pressure returned to baseline and stable Postop Assessment: no signs of nausea or vomiting Anesthetic complications: no    Last Vitals:  Filed Vitals:   06/25/15 1445 06/25/15 1457  BP: 104/57   Pulse: 67 83  Temp:    Resp: 15 19    Last Pain:  Filed Vitals:   06/25/15 1459  PainSc: 3                  Janye Maynor JENNETTE

## 2015-06-25 NOTE — Interval H&P Note (Signed)
History and Physical Interval Note:  06/25/2015 12:56 PM  Raven Romero  has presented today for surgery, with the diagnosis of right breast mass/sclerosing lesion  The various methods of treatment have been discussed with the patient and family. After consideration of risks, benefits and other options for treatment, the patient has consented to  Procedure(s): RIGHT BREAST SEED LOCALIZATION LUMPECTOMY  (Right) as a surgical intervention .  The patient's history has been reviewed, patient examined, no change in status, stable for surgery.  I have reviewed the patient's chart and labs.  Questions were answered to the patient's satisfaction.     Kafi Dotter A.

## 2015-06-25 NOTE — Anesthesia Preprocedure Evaluation (Signed)
Anesthesia Evaluation  Patient identified by MRN, date of birth, ID band Patient awake    Reviewed: Allergy & Precautions, NPO status , Patient's Chart, lab work & pertinent test results  History of Anesthesia Complications Negative for: history of anesthetic complications  Airway Mallampati: II  TM Distance: >3 FB Neck ROM: Full    Dental no notable dental hx. (+) Dental Advisory Given   Pulmonary neg pulmonary ROS,    Pulmonary exam normal breath sounds clear to auscultation       Cardiovascular hypertension, Pt. on medications Normal cardiovascular exam Rhythm:Regular Rate:Normal     Neuro/Psych negative neurological ROS  negative psych ROS   GI/Hepatic negative GI ROS, Neg liver ROS,   Endo/Other  obesity  Renal/GU negative Renal ROS  negative genitourinary   Musculoskeletal negative musculoskeletal ROS (+)   Abdominal   Peds negative pediatric ROS (+)  Hematology negative hematology ROS (+)   Anesthesia Other Findings   Reproductive/Obstetrics negative OB ROS                             Anesthesia Physical Anesthesia Plan  ASA: II  Anesthesia Plan: General   Post-op Pain Management:    Induction: Intravenous  Airway Management Planned:   Additional Equipment:   Intra-op Plan:   Post-operative Plan: Extubation in OR  Informed Consent: I have reviewed the patients History and Physical, chart, labs and discussed the procedure including the risks, benefits and alternatives for the proposed anesthesia with the patient or authorized representative who has indicated his/her understanding and acceptance.   Dental advisory given  Plan Discussed with: CRNA  Anesthesia Plan Comments:         Anesthesia Quick Evaluation

## 2015-06-25 NOTE — Anesthesia Procedure Notes (Signed)
Procedure Name: LMA Insertion Date/Time: 06/25/2015 1:23 PM Performed by: Genevieve NorlanderLINKA, Raven Herbst L Pre-anesthesia Checklist: Patient identified, Emergency Drugs available, Suction available, Patient being monitored and Timeout performed Patient Re-evaluated:Patient Re-evaluated prior to inductionOxygen Delivery Method: Circle System Utilized Preoxygenation: Pre-oxygenation with 100% oxygen Intubation Type: IV induction Ventilation: Mask ventilation without difficulty LMA: LMA inserted LMA Size: 4.0 Number of attempts: 1 Airway Equipment and Method: Bite block Placement Confirmation: positive ETCO2 Tube secured with: Tape Dental Injury: Teeth and Oropharynx as per pre-operative assessment

## 2015-06-26 ENCOUNTER — Encounter (HOSPITAL_BASED_OUTPATIENT_CLINIC_OR_DEPARTMENT_OTHER): Payer: Self-pay | Admitting: Surgery

## 2015-08-03 ENCOUNTER — Telehealth: Payer: Self-pay | Admitting: Emergency Medicine

## 2015-08-03 NOTE — Telephone Encounter (Signed)
-----   Message from Patton SallesBrook E Amundson C Silva, MD sent at 08/01/2015  7:23 PM EST ----- Regarding: RE: Mammogram hold  Ok to remove from hold.   Thank you,   Brook ----- Message -----    From: Joeseph Amorracy L Taiyana Kissler, RN    Sent: 07/31/2015   4:50 PM      To: Brook Rosalin HawkingE Amundson C Silva, MD Subject: Mammogram hold                                 Dr. Edward JollySilva,  Patient in mammogram hold.  Lumpectomy completed 06/25/15. Pathology results in EPIC.  Okay to remove from hold?

## 2015-08-03 NOTE — Telephone Encounter (Signed)
Out of hold per Dr. Silva   

## 2015-11-05 ENCOUNTER — Ambulatory Visit (INDEPENDENT_AMBULATORY_CARE_PROVIDER_SITE_OTHER): Payer: BLUE CROSS/BLUE SHIELD | Admitting: Certified Nurse Midwife

## 2015-11-05 ENCOUNTER — Encounter: Payer: Self-pay | Admitting: Certified Nurse Midwife

## 2015-11-05 VITALS — BP 98/60 | HR 70 | Resp 16 | Ht 61.25 in | Wt 183.0 lb

## 2015-11-05 DIAGNOSIS — Z0000 Encounter for general adult medical examination without abnormal findings: Secondary | ICD-10-CM | POA: Diagnosis not present

## 2015-11-05 DIAGNOSIS — Z01419 Encounter for gynecological examination (general) (routine) without abnormal findings: Secondary | ICD-10-CM | POA: Diagnosis not present

## 2015-11-05 DIAGNOSIS — N951 Menopausal and female climacteric states: Secondary | ICD-10-CM | POA: Diagnosis not present

## 2015-11-05 DIAGNOSIS — N390 Urinary tract infection, site not specified: Secondary | ICD-10-CM

## 2015-11-05 LAB — POCT URINALYSIS DIPSTICK
BILIRUBIN UA: NEGATIVE
Blood, UA: NEGATIVE
GLUCOSE UA: NEGATIVE
KETONES UA: NEGATIVE
LEUKOCYTES UA: NEGATIVE
Nitrite, UA: POSITIVE
Protein, UA: NEGATIVE
Urobilinogen, UA: NEGATIVE
pH, UA: 5

## 2015-11-05 MED ORDER — PHENAZOPYRIDINE HCL 200 MG PO TABS: 200.0000 mg | ORAL_TABLET | Freq: Three times a day (TID) | ORAL | Status: DC | PRN

## 2015-11-05 MED ORDER — CIPROFLOXACIN HCL 500 MG PO TABS
500.0000 mg | ORAL_TABLET | Freq: Two times a day (BID) | ORAL | Status: DC
Start: 2015-11-05 — End: 2015-11-17

## 2015-11-05 NOTE — Patient Instructions (Signed)

## 2015-11-05 NOTE — Progress Notes (Signed)
55 y.o. G91P1001 Married  Caucasian Fe here for annual. Menopausal no HRT. Denies vaginal bleeding. Vaginal dryness better with Premarin use. Complaining urinary frequency and urgency about one week ago, taking Azo with some relief and now having pain with urination. Denies fever and chills or headache. Patient has noted odor with urine also. Still some occasional pain from right breast from surgical site of breast lesion. Out of follow up, for annual screening in 10/17. Has appointment with new PCP to establish care in 2 weeks, current PCP managing cholesterol and hypertension.. Will have screening labs there. No other health issues today.  Patient's last menstrual period was 07/18/2010 (approximate).          Sexually active: Yes.    The current method of family planning is tubal ligation.    Exercising: Yes.    walking Smoker:  no  Health Maintenance: Pap:  11-03-14 neg MMG: 12/16 Rt breast complex sclerosing lesion excision Colonoscopy:  01-06-12 f/u 25yrs polyp BMD:   none TDaP:  2013 Shingles: no Pneumonia: no Hep C and HIV: not done Labs: Poct urine-nitrite positive Self breast exam:done monthly   reports that she has never smoked. She does not have any smokeless tobacco history on file. She reports that she drinks about 1.2 - 2.4 oz of alcohol per week. She reports that she does not use illicit drugs.  Past Medical History  Diagnosis Date  . Kidney stones   . Cervical polyp 04/09/15    2 small polyps on ultrasound  . Hypertension   . Breast mass, right   . Shingles     Past Surgical History  Procedure Laterality Date  . Tubal ligation      BTSP  . Cystectomy  1999    ovarian cystectomy  . Nasal sinus surgery    . Breast lumpectomy with radioactive seed localization Right 06/25/2015    Procedure: RIGHT BREAST SEED LOCALIZATION LUMPECTOMY ;  Surgeon: Harriette Bouillon, MD;  Location: [facility];  Service: General;  Laterality: Right;    Current Outpatient  Prescriptions  Medication Sig Dispense Refill  . BENICAR 40 MG tablet daily.     Marland Kitchen conjugated estrogens (PREMARIN) vaginal cream Place 1 Applicatorful vaginally daily. use 1/2 g vaginally two or three times per week as needed to maintain symptom relief. 30 g 1  . hydrochlorothiazide (HYDRODIURIL) 25 MG tablet daily.     . traMADol (ULTRAM) 50 MG tablet Take 1 tablet (50 mg total) by mouth every 6 (six) hours as needed. 30 tablet 0   No current facility-administered medications for this visit.    Family History  Problem Relation Age of Onset  . Hypertension Mother   . Breast cancer Mother   . Diabetes Paternal Grandmother   . Diabetes Paternal Grandfather     ROS:  Pertinent items are noted in HPI.  Otherwise, a comprehensive ROS was negative.  Exam:   BP 98/60 mmHg  Pulse 70  Resp 16  Ht 5' 1.25" (1.556 m)  Wt 183 lb (83.008 kg)  BMI 34.28 kg/m2  LMP 07/18/2010 (Approximate) Height: 5' 1.25" (155.6 cm) Ht Readings from Last 3 Encounters:  11/05/15 5' 1.25" (1.556 m)  06/25/15  (1.6 m)  04/09/15 5' 2.25" (1.581 m)    General appearance: alert, cooperative and appears stated age Head: Normocephalic, without obvious abnormality, atraumatic Neck: no adenopathy, supple, symmetrical, trachea midline and thyroid normal to inspection and palpation Lungs: clear to auscultation bilaterally Breasts: normal appearance, no masses  or tenderness, No nipple retraction or dimpling, No nipple discharge or bleeding, No axillary or supraclavicular adenopathy, scarring on right breast only Heart: regular rate and rhythm Abdomen: soft, non-tender; no masses,  no organomegaly Extremities: extremities normal, atraumatic, no cyanosis or edema Skin: Skin color, texture, turgor normal. No rashes or lesions Lymph nodes: Cervical, supraclavicular, and axillary nodes normal. No abnormal inguinal nodes palpated Neurologic: Grossly normal   Pelvic: External genitalia:  no lesions               Urethra:  normal appearing urethra with no masses, tenderness or lesions              Bartholin's and Skene's: normal                 Vagina: normal appearing vagina with normal color and discharge, no lesions              Cervix: no cervical motion tenderness, no lesions and normal appearance              Pap taken: No. Bimanual Exam:  Uterus:  normal size, contour, position, consistency, mobility, non-tender              Adnexa: normal adnexa and no mass, fullness, tenderness               Rectovaginal: Confirms               Anus:  normal sphincter tone, no lesions  Chaperone present: yes  A:  Well Woman with normal exam  Menopausal no HRT  Vaginal dryness, Premarin cream working well, desires continuance  Hypertension/cholesterol management with PCP  UTI  Right breast lumpectomy for sclerosing lesion with radioactive seed treatment, routine mammogram follow up now  P:   Reviewed health and wellness pertinent to exam  Aware of need to advise if vaginal bleeding  Rx Premarin cream with instructions, risks/benefits, warning signs reviewed.  Continue follow up with MD as indicated  Rx Cipro see order  Rx Pyridium see order with instructions  Warning signs with UTI given and need to advise.  Stressed importance of follow up with mammogram  Pap smear as above  Not taken   counseled on breast self exam, mammography screening, adequate intake of calcium and vitamin D, diet and exercise, discussed weight watchers for weight control, patient may try.  return annually or prn  An After Visit Summary was printed and given to the patient.

## 2015-11-06 LAB — URINALYSIS, MICROSCOPIC ONLY
CASTS: NONE SEEN [LPF]
CRYSTALS: NONE SEEN [HPF]
Yeast: NONE SEEN [HPF]

## 2015-11-06 LAB — URINE CULTURE
Colony Count: NO GROWTH
Organism ID, Bacteria: NO GROWTH

## 2015-11-12 NOTE — Progress Notes (Signed)
Encounter reviewed Ace Bergfeld, MD   

## 2015-11-17 ENCOUNTER — Ambulatory Visit (INDEPENDENT_AMBULATORY_CARE_PROVIDER_SITE_OTHER): Payer: BLUE CROSS/BLUE SHIELD | Admitting: Certified Nurse Midwife

## 2015-11-17 ENCOUNTER — Encounter: Payer: Self-pay | Admitting: Certified Nurse Midwife

## 2015-11-17 ENCOUNTER — Telehealth: Payer: Self-pay | Admitting: Certified Nurse Midwife

## 2015-11-17 VITALS — BP 102/62 | HR 70 | Temp 97.7°F | Resp 16 | Ht 61.25 in | Wt 184.0 lb

## 2015-11-17 DIAGNOSIS — R3915 Urgency of urination: Secondary | ICD-10-CM | POA: Diagnosis not present

## 2015-11-17 DIAGNOSIS — N390 Urinary tract infection, site not specified: Secondary | ICD-10-CM

## 2015-11-17 NOTE — Telephone Encounter (Signed)
Patient wants to speak with the nurse no information given. °

## 2015-11-17 NOTE — Telephone Encounter (Signed)
Return call to patient. Was seen on 11-05-15 with Raven Romero for UTI issues. States completed medication end of last week and symptoms now returning. Per lab report, urine culture was negative. Patient reports intermittent left sided back pain, straining to void and urinary frequency. Denies fever. Took two pyridium today that helped symptoms but did not resolve them. Appointment this afternoon, instructed to come now.   Routing to provider for final review. Patient agreeable to disposition. Will close encounter.

## 2015-11-17 NOTE — Progress Notes (Signed)
55 y.o. Married Caucasian female G1P1001 here with complaint of urgency with onset  on 2-3 days..  Patient denies fever, chills, nausea or back pain. No new personal products. Patient feels not related  to sexual activity. Does have vaginal dryness and is not using Premarin cream as instructed.. Patient not consuming adequate water intake. Recent visit with urology regarding kidney stone and still has one in left kidney with no change. Patient took Pyridium she had and felt this helped with urgency. Just worried the stone will start moving and she will not know what to do. No other health concerns today.   O: Healthy female WDWN Affect: Normal, orientation x 3 Skin : warm and dry CVAT: negative bilateral Abdomen: negative for suprapubic tenderness  Pelvic exam: External genital area: normal, no lesions, dryness noted at introitus Bladder,Urethra non tender, Urethral meatus: non tender, no redness, but dry appearing skin around area Vagina: scant vaginal discharge, normal appearance   Cervix: normal, non tender Uterus:normal,non tender Adnexa: normal non tender, no fullness or masses   A:Normal pelvic exam Vaginal dryness History of kidney stones and one currently, no change per urology R/O UTI Unable to dip urine due to azo being taken  P: Reviewed findings of normal pelvic exam. Discussed vaginal dryness can contribute to urinary urgency and recommend putting small amount of Premarin cream at urethral meatus area. Use vaginally as instructed. Discussed importance of adequate fluid intake so kidney and bladder work appropriately to stay healthy. Concentrated urine can cause urgency and pain. Increase water!! Lab Urine micro and culture. Reviewed warning signs and symptoms of UTI and need to advise if occurring. Encouraged to limit soda, tea, and coffee.   RV prn

## 2015-11-18 LAB — URINALYSIS, MICROSCOPIC ONLY
Bacteria, UA: NONE SEEN [HPF]
Casts: NONE SEEN [LPF]
Crystals: NONE SEEN [HPF]
RBC / HPF: NONE SEEN RBC/HPF (ref <=2)
YEAST: NONE SEEN [HPF]

## 2015-11-18 LAB — URINE CULTURE
Colony Count: NO GROWTH
Organism ID, Bacteria: NO GROWTH

## 2015-11-18 NOTE — Progress Notes (Signed)
Reviewed personally.  M. Suzanne Alylah Blakney, MD.  

## 2016-01-05 ENCOUNTER — Other Ambulatory Visit: Payer: Self-pay | Admitting: Gastroenterology

## 2016-01-05 DIAGNOSIS — R1011 Right upper quadrant pain: Secondary | ICD-10-CM

## 2016-01-21 ENCOUNTER — Ambulatory Visit (HOSPITAL_COMMUNITY)
Admission: RE | Admit: 2016-01-21 | Discharge: 2016-01-21 | Disposition: A | Discharge status: Home / Self Care | Payer: BLUE CROSS/BLUE SHIELD | Source: Ambulatory Visit | Attending: Gastroenterology | Admitting: Gastroenterology

## 2016-01-21 DIAGNOSIS — R1011 Right upper quadrant pain: Secondary | ICD-10-CM

## 2016-01-21 DIAGNOSIS — K824 Cholesterolosis of gallbladder: Secondary | ICD-10-CM | POA: Diagnosis not present

## 2016-01-21 MED ORDER — TECHNETIUM TC 99M MEBROFENIN IV KIT
4.9900 | PACK | Freq: Once | INTRAVENOUS | Status: AC | PRN
  Administered 2016-01-21: 4.99 via INTRAVENOUS

## 2016-02-23 ENCOUNTER — Ambulatory Visit: Payer: Self-pay | Admitting: Surgery

## 2016-03-31 ENCOUNTER — Encounter: Payer: Self-pay | Admitting: Podiatry

## 2016-03-31 ENCOUNTER — Ambulatory Visit (INDEPENDENT_AMBULATORY_CARE_PROVIDER_SITE_OTHER): Payer: BLUE CROSS/BLUE SHIELD

## 2016-03-31 ENCOUNTER — Ambulatory Visit (INDEPENDENT_AMBULATORY_CARE_PROVIDER_SITE_OTHER): Payer: BLUE CROSS/BLUE SHIELD | Admitting: Podiatry

## 2016-03-31 DIAGNOSIS — M2042 Other hammer toe(s) (acquired), left foot: Secondary | ICD-10-CM

## 2016-03-31 DIAGNOSIS — R52 Pain, unspecified: Secondary | ICD-10-CM

## 2016-03-31 DIAGNOSIS — M722 Plantar fascial fibromatosis: Secondary | ICD-10-CM | POA: Diagnosis not present

## 2016-03-31 DIAGNOSIS — B353 Tinea pedis: Secondary | ICD-10-CM

## 2016-03-31 DIAGNOSIS — M2041 Other hammer toe(s) (acquired), right foot: Secondary | ICD-10-CM

## 2016-03-31 MED ORDER — CLOTRIMAZOLE-BETAMETHASONE 1-0.05 % EX CREA: 1.0000 "application " | TOPICAL_CREAM | Freq: Two times a day (BID) | CUTANEOUS | 0 refills | Status: DC

## 2016-03-31 MED ORDER — MELOXICAM 15 MG PO TABS: 15.0000 mg | ORAL_TABLET | Freq: Every day | ORAL | 2 refills | Status: DC

## 2016-03-31 NOTE — Progress Notes (Signed)
   Subjective:    Patient ID: Raven Romero, female    DOB: 1961/06/21, 55 y.o.   MRN: [id number]  HPI  [age] year old female presents the office of consent left heel pain which in ongoing for about 1 month which started after she change her shoes. She has pain when she first gets up into the day with a lot of walking. She's had a recent treatment for this. She doesn't history plantar fasciitis on the right side which been on for about 1 year after multiple treatments that she finally had EPAT therapy which did help. She has a states she gets some itchiness in between her toes on her left foot and she is no certain as a surgical referral. Denies any recent injury or trauma. No swelling or redness. The pain does not wake her up at night. No other complaints at this time.   Review of Systems  All other systems reviewed and are negative.      Objective:   Physical Exam General: AAO x3, NAD  Dermatological: Some of the dry, redness to the skin along the third interspace on the left foot but subjectively does itch. This consistent with tinea pedis. No open lesions are identified at this time. Some mild dry skin to the heels. No fissuring or drainage.  Vascular: Dorsalis Pedis artery and Posterior Tibial artery pedal pulses are 2/4 bilateral with immedate capillary fill time. Pedal hair growth present. There is no pain with calf compression, swelling, warmth, erythema.   Neruologic: Grossly intact via light touch bilateral. Vibratory intact via tuning fork bilateral. Protective threshold with Semmes Wienstein monofilament intact to all pedal sites bilateral.   Musculoskeletal: Tenderness to palpation along the plantar medial tubercle of the calcaneus at the insertion of plantar fascia on the left foot. There is no pain along the course of the plantar fascia within the arch of the foot. Plantar fascia appears to be intact. There is no pain with lateral compression of the calcaneus or pain with vibratory  sensation. There is no pain along the course or insertion of the achilles tendon. No other areas of tenderness to bilateral lower extremities. Eqinus is present. Hammertoes are present although mild. As a decrease in medial arch On weightbearing. MMT/5. Range of motion intact.   Gait: Unassisted, Nonantalgic.      Assessment & Plan:  [age] year old female with left heel pain likely plantar fasciitis; tinea pedis; hammertoes  -Treatment options discussed including all alternatives, risks, and complications -Etiology of symptoms were discussed -X-rays were obtained and reviewed with the patient. No evidence of acute fracture. Accessory navicular -Patient elects to proceed with steroid injection into the left heel. Under sterile skin preparation, a total of 2.5cc of kenalog 10, 0.5% Marcaine plain, and 2% lidocaine plain were infiltrated into the symptomatic area without complication. A band-aid was applied. Patient tolerated the injection well without complication. Post-injection care with discussed with the patient. Discussed with the patient to ice the area over the next couple of days to help prevent a steroid flare.  -Plantar fascial brace -Ice/stretching daily -Shoegear modification; discussed Orthotics which she has at home but they are worn out and I recommend her to bring them with her to her next appointment. -Lotrisone for tinea pedis -Toe crest for hammertoes -Follow-up in 2-3 weeks or sooner if needed. Symptoms medial continual likely go ahead and start EPAT.   Ovid CurdMatthew Viriginia Amendola, DPM

## 2016-04-01 ENCOUNTER — Ambulatory Visit: Payer: BLUE CROSS/BLUE SHIELD | Admitting: Podiatry

## 2016-04-05 ENCOUNTER — Encounter: Payer: Self-pay | Admitting: Certified Nurse Midwife

## 2016-04-05 ENCOUNTER — Telehealth: Payer: Self-pay | Admitting: Certified Nurse Midwife

## 2016-04-05 ENCOUNTER — Ambulatory Visit (INDEPENDENT_AMBULATORY_CARE_PROVIDER_SITE_OTHER): Payer: BLUE CROSS/BLUE SHIELD | Admitting: Certified Nurse Midwife

## 2016-04-05 VITALS — BP 110/78 | HR 74 | Resp 16 | Ht 61.25 in | Wt 186.0 lb

## 2016-04-05 DIAGNOSIS — N952 Postmenopausal atrophic vaginitis: Secondary | ICD-10-CM | POA: Diagnosis not present

## 2016-04-05 DIAGNOSIS — N951 Menopausal and female climacteric states: Secondary | ICD-10-CM | POA: Diagnosis not present

## 2016-04-05 MED ORDER — ESTROGENS, CONJUGATED 0.625 MG/GM VA CREA: 1.0000 | TOPICAL_CREAM | Freq: Every day | VAGINAL | 3 refills | Status: DC

## 2016-04-05 NOTE — Telephone Encounter (Signed)
Patient has question for nurse. Did not leave any details.

## 2016-04-05 NOTE — Patient Instructions (Signed)
Lichen Sclerosus Lichen sclerosus is a skin problem. It can happen on any part of the body, but it commonly involves the anal or genital areas. It can cause itching and discomfort in these areas. Treatment can help to control symptoms. When the genital area is affected, getting treatment is important because the condition can cause scarring that may lead to other problems. CAUSES The cause of this condition is not known. It could be the result of an overactive immune system or a lack of certain hormones. Lichen sclerosus is not an infection or a fungus. It is not passed from one person to another (not contagious). RISK FACTORS This condition is more likely to develop in women, usually after menopause. SYMPTOMS Symptoms of this condition include:  Thin, wrinkled, white areas on the skin.  Thickened white areas on the skin.  Red and swollen patches (lesions) on the skin.  Tears or cracks in the skin.  Bruising.  Blood blisters.  Severe itching. You may also have pain, itching, or burning with urination. Constipation is also common in people with lichen sclerosus. DIAGNOSIS This condition may be diagnosed with a physical exam. In some cases, a tissue sample (biopsy sample) may be removed to be looked at under a microscope. TREATMENT This condition is usually treated with medicated creams or ointments (topical steroids) that are applied over the affected areas. HOME CARE INSTRUCTIONS  Take over-the-counter and prescription medicines only as told by your health care provider.  Use creams or ointments as told by your health care provider.  Do not scratch the affected areas of skin.  Women should keep the vaginal area as clean and dry as possible.  Keep all follow-up visits as told by your health care provider. This is important. SEEK MEDICAL CARE IF:  You have increasing redness, swelling, or pain in the affected area.  You have fluid, blood, or pus coming from the affected  area.  You have new lesions on your skin.  You have pain or burning with urination.  You have pain during sex.   This information is not intended to replace advice given to you by your health care provider. Make sure you discuss any questions you have with your health care provider.   Document Released: 11/24/2010 Document Revised: 03/25/2015 Document Reviewed: 09/29/2014 Elsevier Interactive Patient Education 2016 [employer] Lichen Sclerosus Lichen sclerosus is a skin problem. It can happen on any part of the body. It happens most often in the anal or genital areas. It can cause itching and discomfort. Treatment can help to control symptoms. This skin problem is not passed from one person to another (not contagious). The cause is not known. HOME CARE  Take over-the-counter and prescription medicines only as told by your doctor.  Use creams or ointments as told by your doctor.  Do not scratch the affected areas of skin.  Women should keep the vagina as clean and dry as they can.  Keep all follow-up visits as told by your doctor. This is important. GET HELP IF:  Your redness, swelling, or pain gets worse.  You have fluid, blood, or pus coming from the area.  You have new patches (lesions) on your skin.  You have pain or burning when you pee (urinate).  You have pain during sex.   This information is not intended to replace advice given to you by your health care provider. Make sure you discuss any questions you have with your health care provider.   Document Released: 06/16/2008 Document  Revised: 03/25/2015 Document Reviewed: 09/29/2014 Elsevier Interactive Patient Education [employer]

## 2016-04-05 NOTE — Telephone Encounter (Signed)
Spoke with patient. Patient states she saw dermotologist on 04/04/16 for a rash on left breast. Patient states she was recommended to follow-up with GYN. Patient called requesting appointment as soon as possible with Raven Romero. Patient states itchy, rash on left breast. Scheduled with Raven Romero, CNM on 04/05/16 at 2:45pm. Patient is agreeable to date and time.  Routing to provider for final review. Patient is agreeable to disposition. Will close encounter.

## 2016-04-05 NOTE — Progress Notes (Signed)
55 y.o. Married Caucasian female G1P1001 here to have vaginal area checked for Lichen sclerosis. Diagnosed with LS by dermatology under breast and on abdomen. Was given Mometasone ointment to use on areas. Patient starting itching all over after use.Stopped use. Feeling better. Currently also has plantar fascitis of left foot and on Mobic for. Has been using Premarin vaginal cream for atrophic vaginitis with good response, no concerns. Stress with daughter's baby due any day. No other health issues today.  O:Healthy female WDWN Affect: normal, orientation x 3  Exam: Abdomen: soft  Inguinal Lymph nodes: no enlargement or tenderness Pelvic exam: External genital: normal female, no decrease in pigment or lace like pattern seen. No LS symptoms noted BUS: negative Vagina: normal vaginal discharge noted with good moisture present. Cervix: absent Uterus: absent  Adnexa:normal, non tender, no masses or fullness noted      A:Normal pelvic exam Atrophic vaginitis improved with Premarin cream use No evidence of Lichen Sclerosis in female genital area   P:Discussed findings of normal appearance . No evidence of LS.Discussed improve appearance of atrophy. Continue Premarin cream as directed. Patient reassured , needs to follow up with Dermatology with other areas involved.    Rv prn

## 2016-04-10 NOTE — Progress Notes (Signed)
Encounter reviewed Jill Jertson, MD   

## 2016-04-14 ENCOUNTER — Ambulatory Visit (INDEPENDENT_AMBULATORY_CARE_PROVIDER_SITE_OTHER): Payer: BLUE CROSS/BLUE SHIELD | Admitting: Podiatry

## 2016-04-14 DIAGNOSIS — M779 Enthesopathy, unspecified: Secondary | ICD-10-CM

## 2016-04-14 DIAGNOSIS — M722 Plantar fascial fibromatosis: Secondary | ICD-10-CM

## 2016-04-14 NOTE — Progress Notes (Signed)
Subjective: 55 year old female presents the office today for follow-up of vibration left heel pain, plantar fasciitis. She says that she is doing much better since last appointment. She also states that she did miss her anti-inflammatory per day or 2 she noticed that her pain did recur coming off the medicine. She she states that she is having no pain. She has been icing but she has not been stretching. Denies any systemic complaints such as fevers, chills, nausea, vomiting. No acute changes since last appointment, and no other complaints at this time. She has a proper orthotic for me to evaluate.  Objective: AAO x3, NAD; presents today wearing a high-heeled shoe DP/PT pulses palpable bilaterally, CRT less than 3 seconds At this time there is no tenderness palpation along the plantar medial tubercle of the calcaneus at insertion of plantar fascia. No pain along the course of plantar fascia or lateral compression of the calcaneus or along the Achilles tendon. There is no area of tenderness to bilateral lower extremity today. MMT/5. Range of motion intact. No edema, erythema, increase in warmth to bilateral lower extremities.  No open lesions or pre-ulcerative lesions.  No pain with calf compression, swelling, warmth, erythema  Assessment: Resolving heel pain, plantar fasciitis  Plan: -All treatment options discussed with the patient including all alternatives, risks, complications.  -She brought in her orthotics and I evaluated him in a pretty free well. I recommend her to wear the inserts as well as supportive shoe gear to hold off on high heels at this time. Also continue plantar fascial brace. Continue ice and stretching exercises. At this time she is having no pain with so we'll hold off on a steroid injection. Followed for 6 weeks if symptoms continue or worsen or recur. -Patient encouraged to call the office with any questions, concerns, change in symptoms.   Ovid CurdMatthew Wagoner, DPM

## 2016-04-14 NOTE — Patient Instructions (Signed)
Posterior Tibial Tendon Tendinitis With Rehab Tendonitis is a condition that is characterized by inflammation of a tendon or the lining (sheath) that surrounds it. The inflammation is usually caused by damage to the tendon, such as a tendon tear (strain). Sprains are classified into three categories. Grade 1 sprains cause pain, but the tendon is not lengthened. Grade 2 sprains include a lengthened ligament due to the ligament being stretched or partially ruptured. With grade 2 sprains there is still function, although the function may be diminished. Grade 3 sprains are characterized by a complete tear of the tendon or muscle, and function is usually impaired. Posterior tibialis tendonitis is tendonitis of the posterior tibial tendon, which attaches muscles of the lower leg to the foot. The posterior tibial tendon is located in the back of the ankle and helps the body straighten (plantar flex) and rotate inward (medially rotate) the ankle. SYMPTOMS   Pain, tenderness, swelling, warmth, and/or redness over the back of the inner ankle at the posterior tibial tendon or the inner part of the mid-foot.  Pain that worsens with plantar flexion or medial rotation of the ankle.  A crackling sound (crepitation) when the tendon is moved or touched. CAUSES  Posterior tibial tendonitis occurs when damage to the posterior tibial tendon starts an inflammatory response. Common mechanisms of injury include:  Degenerative (occurs with aging) processes that weaken the tendon and make it more susceptible to injury.  Stress placed on the tendon from an increase in the intensity, frequency, or duration of training.  Direct trauma to the ankle.  Returning to activity before a previous ankle injury is allowed to heal. RISK INCREASES WITH:  Activities that involve repetitive and/or stressful plantar flexion (jumping, kicking, or running up/down hills).  Poor strength and flexibility.  Flat feet.  Previous injury to  the foot, ankle, or leg. PREVENTION   Warm up and stretch properly before activity.  Allow for adequate recovery between workouts.  Maintain physical fitness:  Strength, flexibility, and endurance.  Cardiovascular fitness.  Learn and use proper technique. When possible, have a coach correct improper technique.  Complete rehabilitation from a previous foot, ankle, or leg injury.  If you have flat feet, wear arch supports (orthotics). PROGNOSIS  If treated properly, the symptoms of tendonitis usually resolve within 6 weeks. This period may be shorter for injuries caused by direct trauma. RELATED COMPLICATIONS   Prolonged healing time, if improperly treated or reinjured.  Recurrent symptoms that result in a chronic problem.  Partial or complete tendon tear (rupture) requiring surgery. TREATMENT  Treatment initially involves the use of ice and medication to help reduce pain and inflammation. The use of strengthening and stretching exercises may help reduce pain with activity. These exercises may be performed at home or with referral to a therapist. Often times, your caregiver will recommend immobilizing the ankle to allow the tendon to heal. If you have flat feet, you may be advised to wear orthotic arch supports. If symptoms persist for greater than 6 months despite nonsurgical (conservative) treatment, then surgery may be recommended. MEDICATION   If pain medication is necessary, then nonsteroidal anti-inflammatory medications, such as aspirin and ibuprofen, or other minor pain relievers, such as acetaminophen, are often recommended.  Do not take pain medication for 7 days before surgery.  Prescription pain relievers may be given if deemed necessary by your caregiver. Use only as directed and only as much as you need.  Corticosteroid injections may be given by your caregiver. These injections should   be reserved for the most serious cases because they may only be given a certain  number of times. HEAT AND COLD  Cold treatment (icing) relieves pain and reduces inflammation. Cold treatment should be applied for 10 to 15 minutes every 2 to 3 hours for inflammation and pain and immediately after any activity that aggravates your symptoms. Use ice packs or massage the area with a piece of ice (ice massage).  Heat treatment may be used prior to performing the stretching and strengthening activities prescribed by your caregiver, physical therapist, or athletic trainer. Use a heat pack or soak the injury in warm water. SEEK MEDICAL CARE IF:  Treatment seems to offer no benefit, or the condition worsens.  Any medications produce adverse side effects. EXERCISES RANGE OF MOTION (ROM) AND STRETCHING EXERCISES - Posterior Tibial Tendon Tendinitis These exercises may help you when beginning to rehabilitate your injury. Your symptoms may resolve with or without further involvement from your physician, physical therapist or athletic trainer. While completing these exercises, remember:   Restoring tissue flexibility helps normal motion to return to the joints. This allows healthier, less painful movement and activity.  An effective stretch should be held for at least 30 seconds.  A stretch should never be painful. You should only feel a gentle lengthening or release in the stretched tissue. RANGE OF MOTION - Ankle Plantar Flexion   Sit with your right / left leg crossed over your opposite knee.  Use your opposite hand to pull the top of your foot and toes toward you.  You should feel a gentle stretch on the top of your foot/ankle. Hold this position for __________ seconds. Repeat __________ times. Complete this exercise __________ times per day.  RANGE OF MOTION - Ankle Eversion   Sit with your right / left ankle crossed over your opposite knee.  Grip your foot with your opposite hand, placing your thumb on the top of your foot and your fingers across the bottom of your  foot.  Gently push your foot downward with a slight rotation so your littlest toes rise slightly.  You should feel a gentle stretch on the inside of your ankle. Hold the stretch for __________ seconds. Repeat __________ times. Complete this exercise __________ times per day.  RANGE OF MOTION - Ankle Inversion   Sit with your right / left ankle crossed over your opposite knee.  Grip your foot with your opposite hand, placing your thumb on the bottom of your foot and your fingers across the top of your foot.  Gently pull your foot so the smallest toe comes toward you and your thumb pushes the inside of the ball of your foot away from you.  You should feel a gentle stretch on the outside of your ankle. Hold the stretch for __________ seconds. Repeat __________ times. Complete this exercise __________ times per day.  RANGE OF MOTION - Dorsi/Plantar Flexion  While sitting with your right / left knee straight, draw the top of your foot upward by flexing your ankle. Then reverse the motion, pointing your toes downward.  Hold each position for __________ seconds.  After completing your first set of exercises, repeat this exercise with your knee bent. Repeat __________ times. Complete this exercise __________ times per day.  RANGE OF MOTION - Ankle Alphabet  Imagine your right / left big toe is a pen.  Keeping your hip and knee still, write out the entire alphabet with your "pen." Make the letters as large as you can without   increasing any discomfort. Repeat __________ times. Complete this exercise __________ times per day.  STRETCH - Gastrocsoleus   Sit with your right / left leg extended. Holding onto both ends of a belt or towel, loop it around the ball of your foot.  Keeping your right / left ankle and foot relaxed and your knee straight, pull your foot and ankle toward you using the belt/towel.  You should feel a gentle stretch behind your calf or knee. Hold this position for  __________ seconds. Repeat __________ times. Complete this exercise __________ times per day.  STRETCH - Gastroc, Standing   Place hands on wall.  Extend right / left leg, keeping the front knee somewhat bent.  Slightly point your toes inward on your back foot.  Keeping your right / left heel on the floor and your knee straight, shift your weight toward the wall, not allowing your back to arch.  You should feel a gentle stretch in the right / left calf. Hold this position for __________ seconds. Repeat __________ times. Complete this stretch __________ times per day. STRETCH - Soleus, Standing   Place hands on wall.  Extend right / left leg, keeping the other knee somewhat bent.  Slightly point your toes inward on your back foot.  Keep your right / left heel on the floor, bend your back knee, and slightly shift your weight over the back leg so that you feel a gentle stretch deep in your back calf.  Hold this position for __________ seconds. Repeat __________ times. Complete this stretch __________ times per day. STRENGTHENING EXERCISES - Posterior Tibial Tendon Tendinitis These exercises may help you when beginning to rehabilitate your injury. They may resolve your symptoms with or without further involvement from your physician, physical therapist, or athletic trainer. While completing these exercises, remember:   Muscles can gain both the endurance and the strength needed for everyday activities through controlled exercises.  Complete these exercises as instructed by your physician, physical therapist, or athletic trainer. Progress the resistance and repetitions only as guided. STRENGTH - Dorsiflexors  Secure a rubber exercise band/tubing to a fixed object (i.e., table, pole) and loop the other end around your right / left foot.  Sit on the floor facing the fixed object. The band/tubing should be slightly tense when your foot is relaxed.  Slowly draw your foot back toward you  using your ankle and toes.  Hold this position for __________ seconds. Slowly release the tension in the band and return your foot to the starting position. Repeat __________ times. Complete this exercise __________ times per day.  STRENGTH - Towel Curls  Sit in a chair positioned on a non-carpeted surface.  Place your foot on a towel, keeping your heel on the floor.  Pull the towel toward your heel by only curling your toes. Keep your heel on the floor.  If instructed by your physician, physical therapist, or athletic trainer, add ____________________ at the end of the towel. Repeat __________ times. Complete this exercise __________ times per day. STRENGTH - Ankle Eversion   Secure one end of a rubber exercise band/tubing to a fixed object (table, pole). Loop the other end around your foot just before your toes.  Place your fists between your knees. This will focus your strengthening at your ankle.  Drawing the band/tubing across your opposite foot, slowly pull your little toe out and up. Make sure the band/tubing is positioned to resist the entire motion.  Hold this position for __________ seconds.  Have   your muscles resist the band/tubing as it slowly pulls your foot back to the starting position. Repeat __________ times. Complete this exercise __________ times per day.  STRENGTH - Ankle Inversion   Secure one end of a rubber exercise band/tubing to a fixed object (table, pole). Loop the other end around your foot just before your toes.  Place your fists between your knees. This will focus your strengthening at your ankle.  Slowly, pull your big toe up and in, making sure the band/tubing is positioned to resist the entire motion.  Hold this position for __________ seconds.  Have your muscles resist the band/tubing as it slowly pulls your foot back to the starting position. Repeat __________ times. Complete this exercises __________ times per day.    This information is not  intended to replace advice given to you by your health care provider. Make sure you discuss any questions you have with your health care provider.   Document Released: 07/04/2005 Document Revised: 11/18/2014 Document Reviewed: 10/16/2008 [employer] Interactive Patient Education 2016 [employer] Plantar Fasciitis With Rehab The plantar fascia is a fibrous, ligament-like, soft-tissue structure that spans the bottom of the foot. Plantar fasciitis, also called heel spur syndrome, is a condition that causes pain in the foot due to inflammation of the tissue. SYMPTOMS   Pain and tenderness on the underneath side of the foot.  Pain that worsens with standing or walking. CAUSES  Plantar fasciitis is caused by irritation and injury to the plantar fascia on the underneath side of the foot. Common mechanisms of injury include:  Direct trauma to bottom of the foot.  Damage to a small nerve that runs under the foot where the main fascia attaches to the heel bone.  Stress placed on the plantar fascia due to bone spurs. RISK INCREASES WITH:   Activities that place stress on the plantar fascia (running, jumping, pivoting, or cutting).  Poor strength and flexibility.  Improperly fitted shoes.  Tight calf muscles.  Flat feet.  Failure to warm-up properly before activity.  Obesity. PREVENTION  Warm up and stretch properly before activity.  Allow for adequate recovery between workouts.  Maintain physical fitness:  Strength, flexibility, and endurance.  Cardiovascular fitness.  Maintain a health body weight.  Avoid stress on the plantar fascia.  Wear properly fitted shoes, including arch supports for individuals who have flat feet. PROGNOSIS  If treated properly, then the symptoms of plantar fasciitis usually resolve without surgery. However, occasionally surgery is necessary. RELATED COMPLICATIONS   Recurrent symptoms that may result in a chronic condition.  Problems of the  lower back that are caused by compensating for the injury, such as limping.  Pain or weakness of the foot during push-off following surgery.  Chronic inflammation, scarring, and partial or complete fascia tear, occurring more often from repeated injections. TREATMENT  Treatment initially involves the use of ice and medication to help reduce pain and inflammation. The use of strengthening and stretching exercises may help reduce pain with activity, especially stretches of the Achilles tendon. These exercises may be performed at home or with a therapist. Your caregiver may recommend that you use heel cups of arch supports to help reduce stress on the plantar fascia. Occasionally, corticosteroid injections are given to reduce inflammation. If symptoms persist for greater than 6 months despite non-surgical (conservative), then surgery may be recommended.  MEDICATION   If pain medication is necessary, then nonsteroidal anti-inflammatory medications, such as aspirin and ibuprofen, or other minor pain relievers, such as acetaminophen,   are often recommended.  Do not take pain medication within 7 days before surgery.  Prescription pain relievers may be given if deemed necessary by your caregiver. Use only as directed and only as much as you need.  Corticosteroid injections may be given by your caregiver. These injections should be reserved for the most serious cases, because they may only be given a certain number of times. HEAT AND COLD  Cold treatment (icing) relieves pain and reduces inflammation. Cold treatment should be applied for 10 to 15 minutes every 2 to 3 hours for inflammation and pain and immediately after any activity that aggravates your symptoms. Use ice packs or massage the area with a piece of ice (ice massage).  Heat treatment may be used prior to performing the stretching and strengthening activities prescribed by your caregiver, physical therapist, or athletic trainer. Use a heat pack  or soak the injury in warm water. SEEK IMMEDIATE MEDICAL CARE IF:  Treatment seems to offer no benefit, or the condition worsens.  Any medications produce adverse side effects. EXERCISES RANGE OF MOTION (ROM) AND STRETCHING EXERCISES - Plantar Fasciitis (Heel Spur Syndrome) These exercises may help you when beginning to rehabilitate your injury. Your symptoms may resolve with or without further involvement from your physician, physical therapist or athletic trainer. While completing these exercises, remember:   Restoring tissue flexibility helps normal motion to return to the joints. This allows healthier, less painful movement and activity.  An effective stretch should be held for at least 30 seconds.  A stretch should never be painful. You should only feel a gentle lengthening or release in the stretched tissue. RANGE OF MOTION - Toe Extension, Flexion  Sit with your right / left leg crossed over your opposite knee.  Grasp your toes and gently pull them back toward the top of your foot. You should feel a stretch on the bottom of your toes and/or foot.  Hold this stretch for __________ seconds.  Now, gently pull your toes toward the bottom of your foot. You should feel a stretch on the top of your toes and or foot.  Hold this stretch for __________ seconds. Repeat __________ times. Complete this stretch __________ times per day.  RANGE OF MOTION - Ankle Dorsiflexion, Active Assisted  Remove shoes and sit on a chair that is preferably not on a carpeted surface.  Place right / left foot under knee. Extend your opposite leg for support.  Keeping your heel down, slide your right / left foot back toward the chair until you feel a stretch at your ankle or calf. If you do not feel a stretch, slide your bottom forward to the edge of the chair, while still keeping your heel down.  Hold this stretch for __________ seconds. Repeat __________ times. Complete this stretch __________ times per  day.  STRETCH - Gastroc, Standing  Place hands on wall.  Extend right / left leg, keeping the front knee somewhat bent.  Slightly point your toes inward on your back foot.  Keeping your right / left heel on the floor and your knee straight, shift your weight toward the wall, not allowing your back to arch.  You should feel a gentle stretch in the right / left calf. Hold this position for __________ seconds. Repeat __________ times. Complete this stretch __________ times per day. STRETCH - Soleus, Standing  Place hands on wall.  Extend right / left leg, keeping the other knee somewhat bent.  Slightly point your toes inward on your back   foot.  Keep your right / left heel on the floor, bend your back knee, and slightly shift your weight over the back leg so that you feel a gentle stretch deep in your back calf.  Hold this position for __________ seconds. Repeat __________ times. Complete this stretch __________ times per day. STRETCH - Gastrocsoleus, Standing  Note: This exercise can place a lot of stress on your foot and ankle. Please complete this exercise only if specifically instructed by your caregiver.   Place the ball of your right / left foot on a step, keeping your other foot firmly on the same step.  Hold on to the wall or a rail for balance.  Slowly lift your other foot, allowing your body weight to press your heel down over the edge of the step.  You should feel a stretch in your right / left calf.  Hold this position for __________ seconds.  Repeat this exercise with a slight bend in your right / left knee. Repeat __________ times. Complete this stretch __________ times per day.  STRENGTHENING EXERCISES - Plantar Fasciitis (Heel Spur Syndrome)  These exercises may help you when beginning to rehabilitate your injury. They may resolve your symptoms with or without further involvement from your physician, physical therapist or athletic trainer. While completing these  exercises, remember:   Muscles can gain both the endurance and the strength needed for everyday activities through controlled exercises.  Complete these exercises as instructed by your physician, physical therapist or athletic trainer. Progress the resistance and repetitions only as guided. STRENGTH - Towel Curls  Sit in a chair positioned on a non-carpeted surface.  Place your foot on a towel, keeping your heel on the floor.  Pull the towel toward your heel by only curling your toes. Keep your heel on the floor.  If instructed by your physician, physical therapist or athletic trainer, add ____________________ at the end of the towel. Repeat __________ times. Complete this exercise __________ times per day. STRENGTH - Ankle Inversion  Secure one end of a rubber exercise band/tubing to a fixed object (table, pole). Loop the other end around your foot just before your toes.  Place your fists between your knees. This will focus your strengthening at your ankle.  Slowly, pull your big toe up and in, making sure the band/tubing is positioned to resist the entire motion.  Hold this position for __________ seconds.  Have your muscles resist the band/tubing as it slowly pulls your foot back to the starting position. Repeat __________ times. Complete this exercises __________ times per day.    This information is not intended to replace advice given to you by your health care provider. Make sure you discuss any questions you have with your health care provider.   Document Released: 07/04/2005 Document Revised: 11/18/2014 Document Reviewed: 10/16/2008 [employer] Interactive Patient Education 2016 [employer]  

## 2016-04-19 ENCOUNTER — Encounter: Payer: Self-pay | Admitting: Certified Nurse Midwife

## 2016-04-19 ENCOUNTER — Ambulatory Visit (INDEPENDENT_AMBULATORY_CARE_PROVIDER_SITE_OTHER): Payer: BLUE CROSS/BLUE SHIELD | Admitting: Certified Nurse Midwife

## 2016-04-19 VITALS — BP 110/70 | HR 80 | Temp 97.4°F | Resp 16 | Ht 61.25 in | Wt 184.2 lb

## 2016-04-19 DIAGNOSIS — N952 Postmenopausal atrophic vaginitis: Secondary | ICD-10-CM

## 2016-04-19 DIAGNOSIS — R350 Frequency of micturition: Secondary | ICD-10-CM | POA: Diagnosis not present

## 2016-04-19 DIAGNOSIS — N342 Other urethritis: Secondary | ICD-10-CM | POA: Diagnosis not present

## 2016-04-19 LAB — POCT URINALYSIS DIPSTICK
BILIRUBIN UA: NEGATIVE
GLUCOSE UA: NEGATIVE
Ketones, UA: NEGATIVE
Leukocytes, UA: NEGATIVE
NITRITE UA: NEGATIVE
Protein, UA: NEGATIVE
RBC UA: NEGATIVE
Urobilinogen, UA: NEGATIVE
pH, UA: 5

## 2016-04-19 MED ORDER — PHENAZOPYRIDINE HCL 200 MG PO TABS: 200.0000 mg | ORAL_TABLET | Freq: Three times a day (TID) | ORAL | 0 refills | Status: DC | PRN

## 2016-04-19 MED ORDER — NITROFURANTOIN MONOHYD MACRO 100 MG PO CAPS: 100.0000 mg | ORAL_CAPSULE | Freq: Two times a day (BID) | ORAL | 0 refills | Status: DC

## 2016-04-19 NOTE — Progress Notes (Signed)
55 y.o. Married Caucasian female [id number] here with complaint of vaginal symptoms burning, and irritation Onset of symptoms 5 days ago. Denies new personal products or vaginal dryness. Has been using Premarin cream for vaginal dryness. No STD concerns. Urinary symptoms frequency and urgency. Slight burning with urination. Denies back pain, nausea or chills or fever. " just feel the need to go every few minutes.. Patient took 2x500 mg Cipro's  antibiotic yesterday she had to see if would stop. . Contraception is BTL. Daughter's [place] over due [place] and worried she will have to stop every few minutes to urinate. ? Vaginal symptoms, please check. No other health issues today.   O:  Healthy female WDWN Affect: normal, orientation x 3  Exam:Skin: warm and dry Abdomen:soft, negative suprapubic CVAT bilateral negative  Inguinal Lymph nodes: no enlargement or tenderness Pelvic exam: External genital: normal female, no redness or scaling or exudate Vagina:moist, normal appearance wet prep taken, ph 4.0 Urethral meatus: red, slightly tender to touch, urethra slightly tender. Bladder, non tender Cervix: normal, non tender, no CMT Uterus: normal, non tender Adnexa:normal, non tender, no masses or fullness noted  Wet Prep; Saline, KOH negative for pathogens POCT urine negative  A:Urethritis Normal pelvic exam Negative wet prep    P:Discussed findings of urethritis and etiology. Discussed need to treat due to symptoms, patient agreeable. Be sure to drink adequate water intake. Avoid tight clothing or underwear, which may increase discomfort. Continue Premarin cream as prescribed and can put very small amount urethral meatus also to reduce risk of UTI. Questions addressed. Discussed negative wet prep.  Rx: Macrobid see order with instructions  Rx Pyridium see order    Rv prn

## 2016-04-20 ENCOUNTER — Telehealth: Payer: Self-pay | Admitting: Certified Nurse Midwife

## 2016-04-20 NOTE — Telephone Encounter (Signed)
Her urine was negative but noted urethritis. She can put a small amount of her premarin cream around urinary meatus, this will sometimes decrease the frequency due to the estrogen effect, can use in this area twice weekly.

## 2016-04-20 NOTE — Telephone Encounter (Signed)
Spoke with patient. Advised of message as seen below from Pepsi[employer]Deborah Leonard CNM. Patient states that she has already placed Premarin cream around her urinary meatus without relief. States she is not over consuming water out of fear of increasing urination. Offered to schedule a recheck appointment with Leota Sauerseborah Leonard CNM, but patient declines. Patient states "I am going to have to go some where else because [place]. I asked for a urine culture to be sent off yesterday and it wasn't. I feel like something more is going on." Advised patient she will need to continue taking Macrobid and may use Pyridium as needed. Advised she may try using Premarin again twice per week to the urinary meatus. Again offered to schedule patient an appointment, but patient declines. Advised if she changes her mind and would like to be seen may contact the office to schedule an appointment. Advised if symptoms worsen or persist while she is out of town she may be seen at a local urgent care or ER. Patient is agreeable and verbalizes understanding.  Routing to provider for final review.

## 2016-04-20 NOTE — Telephone Encounter (Signed)
Patient would like to speak with the nurse regarding her visit yesterday with Debbie.

## 2016-04-20 NOTE — Telephone Encounter (Signed)
Patient returning your call.

## 2016-04-20 NOTE — Telephone Encounter (Signed)
Left message to call Ciearra Rufo at [phone].  

## 2016-04-20 NOTE — Progress Notes (Signed)
Encounter reviewed Naheem Mosco, MD   

## 2016-04-20 NOTE — Telephone Encounter (Signed)
Spoke with patient. Patient states that she was seen in the office yesterday with Raven Romero CNM and was started on Macrobid 100 mg BID for urinary symptoms. Took 2 doses of Macrobid yesterday and 1 dose today. Taking Pyridium 200 mg as needed for discomfort. Reports urinary frequency has increased today. "I cannot stop going to the bathroom." Reports slight burning with urination. Denies fever, chills, or lower back pain. Feels like she is "bloated." Patient is going to Raven Romero for the birth of her grandchild. She is concerned because of how often she is having to use the restroom. Asking if she will need an alternative antibiotic. Advised I will speak with Raven Romero CNM and return call with further recommendations. Patient is agreeable.

## 2016-04-26 ENCOUNTER — Encounter: Payer: Self-pay | Admitting: Certified Nurse Midwife

## 2016-04-26 NOTE — Telephone Encounter (Signed)
Deborah Leonard CNM okay to close encounter? 

## 2016-04-26 NOTE — Telephone Encounter (Signed)
yes

## 2016-04-27 ENCOUNTER — Telehealth: Payer: Self-pay

## 2016-04-27 NOTE — Telephone Encounter (Signed)
Spoke with patient. Please see telephone encounter dated 04/27/2016.  Routing to provider for final review. Patient agreeable to disposition. Will close encounter.

## 2016-04-27 NOTE — Telephone Encounter (Signed)
Spoke with patient. Patient states that she was seen at an urgent care on 04/20/2016 and her urine culture showed blood and bacteria. Reports she was advised to stop taking Macrobid and start taking Cipro 500 mg BID. Reports since starting Cipro she is feeling much better and is no longer having symptoms. Aware if her symptoms return she will need to contact the office.  Non-Urgent Medical Question  Message [phone]  From Mar Daringeresa L Sons To Verner CholDeborah S Leonard, CNM Sent 04/26/2016 4:43 PM  Ms Jacki ConesLeonard ,  Hi , I wanted to let you know that after I was in your office on Tuesday, October 3rd, my condition worsened, I did call your office on Tuesday afternoon, the Macrobid wasn't helping, I ended up going to accute care on Wednesday afternoon and the Urinalysis showed up blood & bacteria. I just thought you would like to note for file &'also Macrobid in the past hadn't worked for me when I went back and looked at my file from [facility].  Thought you would like to note in my file.  Thanks  Terri   Responsible Party   Pool - [facility] Clinical Pool No one has taken responsibility for this message.  No actions have been taken on this message.   Routing to provider for final review. Patient agreeable to disposition. Will close encounter.

## 2016-05-05 ENCOUNTER — Other Ambulatory Visit: Payer: Self-pay | Admitting: Obstetrics and Gynecology

## 2016-05-05 DIAGNOSIS — Z1231 Encounter for screening mammogram for malignant neoplasm of breast: Secondary | ICD-10-CM

## 2016-05-24 ENCOUNTER — Ambulatory Visit
Admission: RE | Admit: 2016-05-24 | Discharge: 2016-05-24 | Disposition: A | Discharge status: Home / Self Care | Payer: BLUE CROSS/BLUE SHIELD | Source: Ambulatory Visit | Attending: Obstetrics and Gynecology | Admitting: Obstetrics and Gynecology

## 2016-05-24 DIAGNOSIS — Z1231 Encounter for screening mammogram for malignant neoplasm of breast: Secondary | ICD-10-CM

## 2016-07-05 DIAGNOSIS — M19032 Primary osteoarthritis, left wrist: Secondary | ICD-10-CM | POA: Diagnosis not present

## 2016-07-05 DIAGNOSIS — M542 Cervicalgia: Secondary | ICD-10-CM | POA: Diagnosis not present

## 2016-07-06 DIAGNOSIS — L821 Other seborrheic keratosis: Secondary | ICD-10-CM | POA: Diagnosis not present

## 2016-07-06 DIAGNOSIS — Z1283 Encounter for screening for malignant neoplasm of skin: Secondary | ICD-10-CM | POA: Diagnosis not present

## 2016-07-06 DIAGNOSIS — L900 Lichen sclerosus et atrophicus: Secondary | ICD-10-CM | POA: Diagnosis not present

## 2016-07-06 DIAGNOSIS — D1801 Hemangioma of skin and subcutaneous tissue: Secondary | ICD-10-CM | POA: Diagnosis not present

## 2016-07-29 DIAGNOSIS — J019 Acute sinusitis, unspecified: Secondary | ICD-10-CM | POA: Diagnosis not present

## 2016-07-29 DIAGNOSIS — J301 Allergic rhinitis due to pollen: Secondary | ICD-10-CM | POA: Diagnosis not present

## 2016-10-17 ENCOUNTER — Ambulatory Visit (INDEPENDENT_AMBULATORY_CARE_PROVIDER_SITE_OTHER): Payer: BLUE CROSS/BLUE SHIELD | Admitting: Podiatry

## 2016-10-17 DIAGNOSIS — M722 Plantar fascial fibromatosis: Secondary | ICD-10-CM

## 2016-10-17 DIAGNOSIS — R52 Pain, unspecified: Secondary | ICD-10-CM | POA: Diagnosis not present

## 2016-10-17 MED ORDER — MELOXICAM 15 MG PO TABS: 15.0000 mg | ORAL_TABLET | Freq: Every day | ORAL | 2 refills | Status: DC

## 2016-10-17 NOTE — Addendum Note (Signed)
Addended byMaury Dus, Archer Moist L on: 10/17/2016 05:29 PM   Modules accepted: Orders

## 2016-10-17 NOTE — Progress Notes (Signed)
This patient presents the office with chief complaint of a painful left heel. She says the heel has been painful for over 2 months, but this past weekend. It was extremely painful. She says that she has difficulty walking and wearing her shoes upon rising in the morning. She states that she has been previously treated with orthotics as well as injection therapy. She also says the Mobic has been beneficial. She presents the office today for continued evaluation and treatment of this condition  GENERAL APPEARANCE: Alert, conversant. Appropriately groomed. No acute distress.  VASCULAR: Pedal pulses are  palpable at  [facility] and PT bilateral.  Capillary refill time is immediate to all digits,  Normal temperature gradient.  Digital hair growth is present bilateral  NEUROLOGIC: sensation is normal to 5.07 monofilament at 5/5 sites bilateral.  Light touch is intact bilateral, Muscle strength normal.  MUSCULOSKELETAL: acceptable muscle strength, tone and stability bilateral.  Intrinsic muscluature intact bilateral.  Rectus appearance of foot and digits noted bilateral. Palpable pain at the insertion plantar fascia left heel. DERMATOLOGIC: skin color, texture, and turgor are within normal limits.  No preulcerative lesions or ulcers  are seen, no interdigital maceration noted.  No open lesions present.  Digital nails are asymptomatic. No drainage noted.  Heel Spur left heel  ROV  injection therapy. Prescribed Mobic to be taken as needed.  Told her to wear her insoles and to return to the office when necessary   Helane Gunther DPM

## 2016-10-17 NOTE — Addendum Note (Signed)
Addended byMaury Dus, Dealva Lafoy L on: 10/17/2016 09:50 AM   Modules accepted: Orders

## 2016-10-19 ENCOUNTER — Telehealth: Payer: Self-pay | Admitting: Podiatry

## 2016-10-19 NOTE — Telephone Encounter (Signed)
Delete conversation

## 2016-11-04 DIAGNOSIS — Z0289 Encounter for other administrative examinations: Secondary | ICD-10-CM

## 2016-11-10 ENCOUNTER — Encounter: Payer: Self-pay | Admitting: Certified Nurse Midwife

## 2016-11-10 ENCOUNTER — Ambulatory Visit (INDEPENDENT_AMBULATORY_CARE_PROVIDER_SITE_OTHER): Payer: BLUE CROSS/BLUE SHIELD | Admitting: Certified Nurse Midwife

## 2016-11-10 VITALS — BP 110/64 | HR 70 | Resp 16 | Ht 61.75 in | Wt 186.0 lb

## 2016-11-10 DIAGNOSIS — E559 Vitamin D deficiency, unspecified: Secondary | ICD-10-CM

## 2016-11-10 DIAGNOSIS — Z01419 Encounter for gynecological examination (general) (routine) without abnormal findings: Secondary | ICD-10-CM | POA: Diagnosis not present

## 2016-11-10 DIAGNOSIS — Z0000 Encounter for general adult medical examination without abnormal findings: Secondary | ICD-10-CM | POA: Diagnosis not present

## 2016-11-10 DIAGNOSIS — Z124 Encounter for screening for malignant neoplasm of cervix: Secondary | ICD-10-CM

## 2016-11-10 DIAGNOSIS — R195 Other fecal abnormalities: Secondary | ICD-10-CM

## 2016-11-10 LAB — POCT URINALYSIS DIPSTICK
BILIRUBIN UA: NEGATIVE
Blood, UA: NEGATIVE
GLUCOSE UA: NEGATIVE
Ketones, UA: NEGATIVE
LEUKOCYTES UA: NEGATIVE
NITRITE UA: NEGATIVE
Protein, UA: NEGATIVE
Urobilinogen, UA: NEGATIVE E.U./dL — AB
pH, UA: 5 (ref 5.0–8.0)

## 2016-11-10 LAB — LIPID PANEL
Cholesterol: 183 mg/dL (ref <200)
HDL: 46 mg/dL — AB (ref >50)
LDL CALC: 100 mg/dL — AB (ref <100)
Total CHOL/HDL Ratio: 4 Ratio (ref <5.0)
Triglycerides: 187 mg/dL — ABNORMAL HIGH (ref <150)
VLDL: 37 mg/dL — AB (ref <30)

## 2016-11-10 NOTE — Progress Notes (Signed)
56 y.o. G45P1001 Married  Caucasian Fe here for annual exam. Menopausal no HRT. Denies vaginal bleeding or vaginal dryness. Premarin cream working well for vaginal dryness. Working on getting new PCP for Cholesterol management, aex and labs. Complaining of stools being pasty and hard to pass, also has noted more pain prior to stools occurring. Due for repeat colonoscopy this year due to polyp. Also has LS flare on breast,which is different, calling to follow up with Dermatologist regarding. Has been treating with Clobetasol as prescribed. No other health issues today.  Patient's last menstrual period was 08/18/2013.          Sexually active: Yes.    The current method of family planning is tubal ligation.    Exercising: Yes.    walk,weight lifting Smoker:  no  Health Maintenance: Pap:  11-03-14 neg History of Abnormal Pap: no MMG:  05-24-16 category b density birads 1:neg Self Breast exams: yes Colonoscopy:  6/13 f/u 5 yrs polyp due this year BMD:   none TDaP:  2013 Shingles: no Pneumonia: no Hep C and HIV: not done, maybe with insurance Labs: poct urine-neg   reports that she has never smoked. She has never used smokeless tobacco. She reports that she drinks about 1.2 - 2.4 oz of alcohol per week . She reports that she does not use drugs.  Past Medical History:  Diagnosis Date  . Breast mass, right   . Cervical polyp 04/09/15   2 small polyps on ultrasound  . Hypertension   . Kidney stones   . Lichen sclerosus   . Shingles     Past Surgical History:  Procedure Laterality Date  . BREAST LUMPECTOMY WITH RADIOACTIVE SEED LOCALIZATION Right 06/25/2015   Procedure: RIGHT BREAST SEED LOCALIZATION LUMPECTOMY ;  Surgeon: Harriette Bouillon, MD;  Location: [facility];  Service: General;  Laterality: Right;  . CYSTECTOMY  1999   ovarian cystectomy  . NASAL SINUS SURGERY    . TUBAL LIGATION     BTSP    Current Outpatient Prescriptions  Medication Sig Dispense Refill  .  clotrimazole-betamethasone (LOTRISONE) cream Apply 1 application topically 2 (two) times daily. 30 g 0  . conjugated estrogens (PREMARIN) vaginal cream Place 1 Applicatorful vaginally daily. use 1/2 g vaginally two or three times per week as needed to maintain symptom relief. 30 g 3  . hydrochlorothiazide (HYDRODIURIL) 25 MG tablet daily.     . meloxicam (MOBIC) 15 MG tablet Take 1 tablet (15 mg total) by mouth daily. 30 tablet 2  . mometasone (ELOCON) 0.1 % cream apply to affected area twice a day  0  . olmesartan (BENICAR) 40 MG tablet Take 40 mg by mouth daily.    Marland Kitchen omeprazole (PRILOSEC) 40 MG capsule   1   No current facility-administered medications for this visit.     Family History  Problem Relation Age of Onset  . Hypertension Mother   . Breast cancer Mother   . Diabetes Paternal Grandmother   . Diabetes Paternal Grandfather     ROS:  Pertinent items are noted in HPI.  Otherwise, a comprehensive ROS was negative.  Exam:   BP 110/64   Pulse 70   Resp 16   Ht 5' 1.75" (1.568 m)   Wt 186 lb (84.4 kg)   LMP 08/18/2013   BMI 34.30 kg/m  Height: 5' 1.75" (156.8 cm) Ht Readings from Last 3 Encounters:  11/10/16 5' 1.75" (1.568 m)  04/19/16 5' 1.25" (1.556 m)  04/05/16 5'  1.25" (1.556 m)    General appearance: alert, cooperative and appears stated age Head: Normocephalic, without obvious abnormality, atraumatic Neck: no adenopathy, supple, symmetrical, trachea midline and thyroid normal to inspection and palpation Lungs: clear to auscultation bilaterally Breasts: normal appearance, no masses or tenderness, No nipple retraction or dimpling, No nipple discharge or bleeding, No axillary or supraclavicular adenopathy Heart: regular rate and rhythm Abdomen: soft, non-tender; no masses,  no organomegaly Extremities: extremities normal, atraumatic, no cyanosis or edema Skin: Skin color, texture, turgor normal. No rashes or lesions Lymph nodes: Cervical, supraclavicular, and  axillary nodes normal. No abnormal inguinal nodes palpated Neurologic: Grossly normal   Pelvic: External genitalia:  no lesions              Urethra:  normal appearing urethra with no masses, tenderness or lesions              Bartholin's and Skene's: normal                 Vagina: normal appearing vagina with normal color and discharge, no lesions              Cervix: multiparous appearance, no bleeding following Pap, no cervical motion tenderness and no lesions              Pap taken: Yes.   Bimanual Exam:  Uterus:  normal size, contour, position, consistency, mobility, non-tender              Adnexa: normal adnexa and no mass, fullness, tenderness               Rectovaginal: Confirms               Anus:  normal sphincter tone, no lesions  Chaperone present: yes  A:  Well Woman with normal exam  Menopausal no HRT  Atrophic vaginitis Premarin cream working well  Stool Change  Recent Lichen Sclerosis flare on breast, has been seen by Dermatology in past, change in appearance per patient. Using Clobetasol as prescribed.  Screening labs      P:   Reviewed health and wellness pertinent to exam  Aware of need to advise if vaginal bleeding  Discussed risks/benefits/warning signs of Premarin cream use. Would like to continue.  Rx Premarin cream see order with instructions  Discussed need for evaluation by GI due to stool change and polyp history. Will schedule with Dr. Loreta Ave prior to leaving today.  Patient will call dermatology for appointment to recheck skin area on breast and will advise if change of diagnosis.  Screening labs: Lipid panel, Vitamin D  Pap smear: yes  counseled on breast self exam, mammography screening, menopause, adequate intake of calcium and vitamin D, diet and exercise  return annually or prn  An After Visit Summary was printed and given to the patient.

## 2016-11-10 NOTE — Progress Notes (Signed)
Scheduled patient with Dr.Mann on 11/16/2016 at 10:45 am. Patient is agreeable to date and time.

## 2016-11-10 NOTE — Patient Instructions (Signed)

## 2016-11-10 NOTE — Progress Notes (Signed)
Encounter reviewed Jill Jertson, MD   

## 2016-11-11 ENCOUNTER — Other Ambulatory Visit: Payer: Self-pay | Admitting: Certified Nurse Midwife

## 2016-11-11 ENCOUNTER — Other Ambulatory Visit: Payer: Self-pay

## 2016-11-11 ENCOUNTER — Telehealth: Payer: Self-pay | Admitting: Certified Nurse Midwife

## 2016-11-11 DIAGNOSIS — E559 Vitamin D deficiency, unspecified: Secondary | ICD-10-CM

## 2016-11-11 DIAGNOSIS — R899 Unspecified abnormal finding in specimens from other organs, systems and tissues: Secondary | ICD-10-CM

## 2016-11-11 LAB — VITAMIN D 25 HYDROXY (VIT D DEFICIENCY, FRACTURES): Vit D, 25-Hydroxy: 29 ng/mL — ABNORMAL LOW (ref 30–100)

## 2016-11-11 MED ORDER — VITAMIN D (ERGOCALCIFEROL) 1.25 MG (50000 UNIT) PO CAPS: 50000.0000 [IU] | ORAL_CAPSULE | ORAL | 0 refills | Status: DC

## 2016-11-11 NOTE — Telephone Encounter (Signed)
Patient is returning a call to [place]. She has some more questions.

## 2016-11-11 NOTE — Telephone Encounter (Signed)
Spoke to patient. She wanted to verify directions for vitamin d rx again. Vit d 50,000iu once weekly for 3 mths. Pt understands.

## 2016-11-14 LAB — IPS PAP TEST WITH HPV

## 2016-11-30 DIAGNOSIS — I10 Essential (primary) hypertension: Secondary | ICD-10-CM | POA: Diagnosis not present

## 2016-11-30 DIAGNOSIS — E785 Hyperlipidemia, unspecified: Secondary | ICD-10-CM | POA: Diagnosis not present

## 2016-12-08 ENCOUNTER — Ambulatory Visit (INDEPENDENT_AMBULATORY_CARE_PROVIDER_SITE_OTHER): Payer: BLUE CROSS/BLUE SHIELD | Admitting: Podiatry

## 2016-12-08 DIAGNOSIS — M722 Plantar fascial fibromatosis: Secondary | ICD-10-CM

## 2016-12-08 MED ORDER — METHYLPREDNISOLONE 4 MG PO TBPK: ORAL_TABLET | ORAL | 0 refills | Status: DC

## 2016-12-14 NOTE — Progress Notes (Signed)
Subjective: [name] presents the office today for concerns of continued left heel pain. She states that the last injection did not help much. She denies any recent injury or trauma to her feet. Denies numbness or tingling. She describes a throbbing sensation in the bottom of her heel which is been ongoing for about 2 months or more. She has pain when she first gets up to be feet all day. She has no new concerns today. Denies any systemic complaints such as fevers, chills, nausea, vomiting. No acute changes since last appointment, and no other complaints at this time.   Objective: AAO x3, NAD DP/PT pulses palpable bilaterally, CRT less than 3 seconds There is continued tenderness to palpation along the plantar medial tubercle of the calcaneus at the insertion of plantar fascia on the left foot. There is no pain along the course of the plantar fascia within the arch of the foot. Plantar fascia appears to be intact. There is no pain with lateral compression of the calcaneus or pain with vibratory sensation. There is no pain along the course or insertion of the achilles tendon. No other areas of tenderness to bilateral lower extremities. Negative tinel sign No open lesions or pre-ulcerative lesions.  No pain with calf compression, swelling, warmth, erythema  Assessment: 56 year old female left shoulder pain, likely plantar fasciitis.  Plan: -All treatment options discussed with the patient including all alternatives, risks, complications.  Patient elects to proceed with steroid injection into the left heel. Under sterile skin preparation, a total of 2.5cc of kenalog 10, 0.5% Marcaine plain, and 2% lidocaine plain were infiltrated into the symptomatic area without complication. A band-aid was applied. Patient tolerated the injection well without complication. Post-injection care with discussed with the patient. Discussed with the patient to ice the area over the next couple of days to help prevent a  steroid flare.  -Medrol Dosepak. Once this is complete can start meloxicam again. Monitor side effects. -Continue stretching, icing exercises daily. -Plantar fascial brace. -RTC in 3 weeks if symptoms continue or sooner if needed. -Patient encouraged to call the office with any questions, concerns, change in symptoms.   Ovid CurdMatthew Wagoner, DPM

## 2016-12-16 ENCOUNTER — Telehealth: Payer: Self-pay | Admitting: *Deleted

## 2016-12-16 NOTE — Telephone Encounter (Signed)
Pt states she began steroids yesterday and was okay, but today after 2 she began to get flush and hot and feel bad. Dr. Ardelle AntonWagoner stated stop the steroid pack and begin the meloxicam again tomorrow. I informed pt and she states understanding.

## 2017-01-11 DIAGNOSIS — Z1211 Encounter for screening for malignant neoplasm of colon: Secondary | ICD-10-CM | POA: Diagnosis not present

## 2017-01-16 ENCOUNTER — Telehealth: Payer: Self-pay | Admitting: Podiatry

## 2017-01-16 ENCOUNTER — Other Ambulatory Visit: Payer: Self-pay

## 2017-01-16 ENCOUNTER — Telehealth: Payer: Self-pay

## 2017-01-16 DIAGNOSIS — M545 Low back pain, unspecified: Secondary | ICD-10-CM | POA: Insufficient documentation

## 2017-01-16 DIAGNOSIS — S93409A Sprain of unspecified ligament of unspecified ankle, initial encounter: Secondary | ICD-10-CM | POA: Insufficient documentation

## 2017-01-16 DIAGNOSIS — M47812 Spondylosis without myelopathy or radiculopathy, cervical region: Secondary | ICD-10-CM | POA: Insufficient documentation

## 2017-01-16 NOTE — Telephone Encounter (Signed)
LVM returning patients call.

## 2017-01-16 NOTE — Telephone Encounter (Signed)
Patient called saying the pain has now shifted to the side away from the heel. Requested a call back at [phone].

## 2017-01-17 ENCOUNTER — Telehealth: Payer: Self-pay | Admitting: *Deleted

## 2017-01-17 NOTE — Telephone Encounter (Signed)
Patient called yesterday and I just returned the phone call and left patient a message stating that the office is closed tomorrow and that I would not be in the office until next Monday but some one would be glad to answer your question and to call the [facility] office. Misty StanleyLisa

## 2017-01-17 NOTE — Telephone Encounter (Signed)
Called and left a message for the patient to call back on Thursday due to being closed on Wednesday. Misty StanleyLisa

## 2017-01-19 ENCOUNTER — Encounter: Payer: Self-pay | Admitting: Podiatry

## 2017-01-19 ENCOUNTER — Ambulatory Visit (INDEPENDENT_AMBULATORY_CARE_PROVIDER_SITE_OTHER): Payer: BLUE CROSS/BLUE SHIELD | Admitting: Podiatry

## 2017-01-19 ENCOUNTER — Ambulatory Visit (INDEPENDENT_AMBULATORY_CARE_PROVIDER_SITE_OTHER): Payer: BLUE CROSS/BLUE SHIELD

## 2017-01-19 ENCOUNTER — Ambulatory Visit: Payer: BLUE CROSS/BLUE SHIELD

## 2017-01-19 ENCOUNTER — Telehealth: Payer: Self-pay | Admitting: Podiatry

## 2017-01-19 DIAGNOSIS — M25572 Pain in left ankle and joints of left foot: Secondary | ICD-10-CM

## 2017-01-19 DIAGNOSIS — M84372A Stress fracture, left ankle, initial encounter for fracture: Secondary | ICD-10-CM | POA: Diagnosis not present

## 2017-01-19 NOTE — Progress Notes (Signed)
Subjective:    Patient ID: Raven Romero, female   DOB: 56 y.o.   MRN: [id number]   HPI patient presents stating that the left ankle has been very sore for the last 2 weeks and she does not remember specific injury. States that it seems that her heel is improved    ROS      Objective:  Physical Exam neurovascular status intact with patient noted to have exquisite discomfort in the fibula left distal with a +1 pitting edema and negative Homans sign noted. The plantar fascia is mildly tender but not any were near as severe and there is no pain more distal to this point     Assessment:    Probability for a stress fracture of the fibula left with plantar fasciitis that's improved at the current time     Plan:    H&P condition reviewed and at this point I have recommended the utilization of a short air fracture walker to completely immobilize allow it to reduce as far as inflammation and aggressive ice. Patient be reevaluated again in the next several weeks  X-rays indicate there is suspicious sign in the distal fibula left

## 2017-01-19 NOTE — Telephone Encounter (Signed)
Pt returned nurse call. Has appointment on 07/12 but can hardly walk and requested to be seen sooner.

## 2017-01-26 ENCOUNTER — Ambulatory Visit: Payer: BLUE CROSS/BLUE SHIELD | Admitting: Podiatry

## 2017-02-01 NOTE — Addendum Note (Signed)
Addended by: Shade Kaley C on: 02/01/2017 08:33 AM   Modules accepted: Orders  

## 2017-02-01 NOTE — Addendum Note (Signed)
Addended by: Luisa DagoPHILLIPS, Milanni Ayub C on: 02/01/2017 08:33 AM   Modules accepted: Orders

## 2017-02-07 ENCOUNTER — Other Ambulatory Visit: Payer: BLUE CROSS/BLUE SHIELD

## 2017-02-20 ENCOUNTER — Ambulatory Visit: Payer: BLUE CROSS/BLUE SHIELD | Admitting: Podiatry

## 2017-02-23 ENCOUNTER — Ambulatory Visit (INDEPENDENT_AMBULATORY_CARE_PROVIDER_SITE_OTHER): Payer: BLUE CROSS/BLUE SHIELD | Admitting: Podiatry

## 2017-02-23 ENCOUNTER — Ambulatory Visit (INDEPENDENT_AMBULATORY_CARE_PROVIDER_SITE_OTHER): Payer: BLUE CROSS/BLUE SHIELD

## 2017-02-23 DIAGNOSIS — M84372D Stress fracture, left ankle, subsequent encounter for fracture with routine healing: Secondary | ICD-10-CM | POA: Diagnosis not present

## 2017-02-23 DIAGNOSIS — M84372A Stress fracture, left ankle, initial encounter for fracture: Secondary | ICD-10-CM | POA: Diagnosis not present

## 2017-02-23 DIAGNOSIS — M722 Plantar fascial fibromatosis: Secondary | ICD-10-CM

## 2017-02-23 NOTE — Progress Notes (Signed)
Subjective:    Patient ID: Raven Romero, female   DOB: 56 y.o.   MRN: [id number]   HPI patient states she's not been wearing her boot as she should've but states the ankle does not hurt as much but the heel has been very sore and making it hard to walk    ROS      Objective:  Physical Exam neurovascular status intact with patient's left ankle still swollen on the lateral side distal where there is probable stress fracture and on the plantar heel is still very tender the medial fascial band     Assessment:   Continued acute plantar fasciitis left that's not responded well to previous conservative care with stress fracture left ankle      Plan:     H&P x-ray reviewed with patient and at this point she needs to continue with boot and then we discussed plantar heel and due to the chronic nature of condition the fact she did very well with the right one she's can initiate shockwave therapy and she is scheduled for 4 shockwaves 3 to be done weekly and then a fourth one month later. Patient was given all instructions on this and is encouraged to wear her boot at this time  X-rays of the left ankle indicate there is a stress fracture of the distal fibula that does appear to be stable and should ultimately heal uneventfully at this position

## 2017-02-27 ENCOUNTER — Ambulatory Visit: Payer: Self-pay | Admitting: Podiatry

## 2017-02-27 DIAGNOSIS — M722 Plantar fascial fibromatosis: Secondary | ICD-10-CM

## 2017-02-27 NOTE — Progress Notes (Signed)
Patient presents with ongoing pain in her left heel that has not responded well to various therapies.   Mild pain on palpation of the left mid to medial heel band   ESWT therapy administered to heel at 11 joules. EPAT administered to surrounding connective tissues for 3000 pulses.

## 2017-03-06 ENCOUNTER — Telehealth: Payer: Self-pay

## 2017-03-06 ENCOUNTER — Encounter: Payer: Self-pay | Admitting: Certified Nurse Midwife

## 2017-03-06 NOTE — Telephone Encounter (Signed)
Please see telephone call dated 03/06/2017.

## 2017-03-06 NOTE — Telephone Encounter (Signed)
Non-Urgent Medical Question  Message [id number]  From Mar Daring Petrella To Verner Chol, CNM Sent 03/06/2017 11:43 AM  Hi Mrs. Darcel Bayley,  I hope you are doing well. I have a question to ask you, I need to come and do my follow up blood test for the Vitamin D. & was wondering while I am doing that can you go ahead and do a full blood panel of test. The reason, I have appointment on 8/29 with a Dermatologist at [facility] for the Lichen because it has gotten worse & should I get examination from the gynecologist stand point to make sure the Lichen has spread,  before I go. I would like to have the blood panel available when I go to this appointment since I do need to do the Vitamin D follow up. Please let me know. Thanks Terri   Responsible Party   Pool - [facility] Clinical Pool No one has taken responsibility for this message.  No actions have been taken on this message.   Routing to [employer] CNM for review and advise.

## 2017-03-07 ENCOUNTER — Ambulatory Visit: Payer: Self-pay | Admitting: Podiatry

## 2017-03-07 DIAGNOSIS — B351 Tinea unguium: Secondary | ICD-10-CM

## 2017-03-07 DIAGNOSIS — M722 Plantar fascial fibromatosis: Secondary | ICD-10-CM

## 2017-03-08 NOTE — Telephone Encounter (Signed)
I think she should follow up with her dermatology. A blood panel should be ordered by provider evaluating the problem if needed. She does need to do Vitamin D. I would be glad to look at lichen if in genital area. Other areas she be evaluated by dermatology.

## 2017-03-09 NOTE — Telephone Encounter (Signed)
Left message to call Kaitlyn at [phone]. 

## 2017-03-13 ENCOUNTER — Ambulatory Visit: Payer: Self-pay | Admitting: Primary Care

## 2017-03-13 NOTE — Progress Notes (Signed)
Patient presents with ongoing pain in her left heel that has not responded well to various therapies. She has noticed an improvement in her pain since last treatment  Mild pain on palpation of the left mid to medial heel band   ESWT therapy administered to heel at 13.5 joules. EPAT administered to surrounding connective tissues for 3000 pulses.

## 2017-03-15 NOTE — Telephone Encounter (Signed)
Left message to call Kaitlyn at [phone]. 

## 2017-03-16 ENCOUNTER — Ambulatory Visit: Payer: BLUE CROSS/BLUE SHIELD

## 2017-03-21 ENCOUNTER — Ambulatory Visit: Payer: BLUE CROSS/BLUE SHIELD

## 2017-03-22 DIAGNOSIS — R21 Rash and other nonspecific skin eruption: Secondary | ICD-10-CM | POA: Diagnosis not present

## 2017-03-22 DIAGNOSIS — L986 Other infiltrative disorders of the skin and subcutaneous tissue: Secondary | ICD-10-CM | POA: Diagnosis not present

## 2017-03-22 DIAGNOSIS — L940 Localized scleroderma [morphea]: Secondary | ICD-10-CM | POA: Diagnosis not present

## 2017-03-24 NOTE — Telephone Encounter (Signed)
Leota Sauerseborah Leonard CNM I have attempted to reach this patient x2 with no return call. Patient was to be seen with dermatology on 8/29. Okay to close encounter?

## 2017-03-26 NOTE — Telephone Encounter (Signed)
Yes OK to close 

## 2017-04-05 DIAGNOSIS — L940 Localized scleroderma [morphea]: Secondary | ICD-10-CM | POA: Diagnosis not present

## 2017-04-26 ENCOUNTER — Other Ambulatory Visit: Payer: Self-pay | Admitting: Obstetrics and Gynecology

## 2017-04-26 DIAGNOSIS — Z1239 Encounter for other screening for malignant neoplasm of breast: Secondary | ICD-10-CM

## 2017-05-09 ENCOUNTER — Other Ambulatory Visit (INDEPENDENT_AMBULATORY_CARE_PROVIDER_SITE_OTHER): Payer: BLUE CROSS/BLUE SHIELD

## 2017-05-09 DIAGNOSIS — R899 Unspecified abnormal finding in specimens from other organs, systems and tissues: Secondary | ICD-10-CM

## 2017-05-09 DIAGNOSIS — E559 Vitamin D deficiency, unspecified: Secondary | ICD-10-CM | POA: Diagnosis not present

## 2017-05-10 LAB — LIPID PANEL
Chol/HDL Ratio: 3.6 ratio (ref 0.0–4.4)
Cholesterol, Total: 200 mg/dL — ABNORMAL HIGH (ref 100–199)
HDL: 55 mg/dL (ref >39)
LDL CALC: 112 mg/dL — AB (ref 0–99)
Triglycerides: 167 mg/dL — ABNORMAL HIGH (ref 0–149)
VLDL CHOLESTEROL CAL: 33 mg/dL (ref 5–40)

## 2017-05-10 LAB — VITAMIN D 25 HYDROXY (VIT D DEFICIENCY, FRACTURES): Vit D, 25-Hydroxy: 28 ng/mL — ABNORMAL LOW (ref 30.0–100.0)

## 2017-05-11 ENCOUNTER — Other Ambulatory Visit: Payer: Self-pay | Admitting: Certified Nurse Midwife

## 2017-05-11 DIAGNOSIS — E559 Vitamin D deficiency, unspecified: Secondary | ICD-10-CM

## 2017-05-17 DIAGNOSIS — Z79899 Other long term (current) drug therapy: Secondary | ICD-10-CM | POA: Diagnosis not present

## 2017-05-17 DIAGNOSIS — L940 Localized scleroderma [morphea]: Secondary | ICD-10-CM | POA: Diagnosis not present

## 2017-05-25 ENCOUNTER — Ambulatory Visit
Admission: RE | Admit: 2017-05-25 | Discharge: 2017-05-25 | Disposition: A | Discharge status: Home / Self Care | Payer: BLUE CROSS/BLUE SHIELD | Source: Ambulatory Visit | Attending: Obstetrics and Gynecology | Admitting: Obstetrics and Gynecology

## 2017-05-25 DIAGNOSIS — Z1231 Encounter for screening mammogram for malignant neoplasm of breast: Secondary | ICD-10-CM | POA: Diagnosis not present

## 2017-05-25 DIAGNOSIS — Z1239 Encounter for other screening for malignant neoplasm of breast: Secondary | ICD-10-CM

## 2017-06-02 DIAGNOSIS — H35033 Hypertensive retinopathy, bilateral: Secondary | ICD-10-CM | POA: Diagnosis not present

## 2017-06-02 DIAGNOSIS — Z79899 Other long term (current) drug therapy: Secondary | ICD-10-CM | POA: Diagnosis not present

## 2017-06-02 DIAGNOSIS — H2513 Age-related nuclear cataract, bilateral: Secondary | ICD-10-CM | POA: Diagnosis not present

## 2017-06-06 DIAGNOSIS — H35033 Hypertensive retinopathy, bilateral: Secondary | ICD-10-CM | POA: Insufficient documentation

## 2017-06-06 DIAGNOSIS — H2513 Age-related nuclear cataract, bilateral: Secondary | ICD-10-CM | POA: Insufficient documentation

## 2017-06-06 DIAGNOSIS — Z79899 Other long term (current) drug therapy: Secondary | ICD-10-CM

## 2017-06-06 HISTORY — DX: Other long term (current) drug therapy: Z79.899

## 2017-07-06 ENCOUNTER — Other Ambulatory Visit (INDEPENDENT_AMBULATORY_CARE_PROVIDER_SITE_OTHER): Payer: BLUE CROSS/BLUE SHIELD

## 2017-07-06 DIAGNOSIS — E559 Vitamin D deficiency, unspecified: Secondary | ICD-10-CM

## 2017-07-13 LAB — VITAMIN D 25 HYDROXY (VIT D DEFICIENCY, FRACTURES)

## 2017-07-14 ENCOUNTER — Telehealth: Payer: Self-pay

## 2017-07-14 DIAGNOSIS — E559 Vitamin D deficiency, unspecified: Secondary | ICD-10-CM

## 2017-07-14 DIAGNOSIS — J209 Acute bronchitis, unspecified: Secondary | ICD-10-CM | POA: Diagnosis not present

## 2017-07-14 NOTE — Telephone Encounter (Signed)
Spoke with patient. Patient is agreeable to return for lab recollection due to lab error that occurred outside of our office on 07/06/2017. Lab appointment scheduled for 07/20/2017 at 10:30 am. Future order placed.  Routing to provider for final review. Patient agreeable to disposition. Will close encounter.

## 2017-07-20 ENCOUNTER — Other Ambulatory Visit (INDEPENDENT_AMBULATORY_CARE_PROVIDER_SITE_OTHER): Payer: BLUE CROSS/BLUE SHIELD

## 2017-07-20 DIAGNOSIS — E559 Vitamin D deficiency, unspecified: Secondary | ICD-10-CM

## 2017-07-21 ENCOUNTER — Other Ambulatory Visit: Payer: Self-pay | Admitting: Certified Nurse Midwife

## 2017-07-21 DIAGNOSIS — E559 Vitamin D deficiency, unspecified: Secondary | ICD-10-CM

## 2017-07-21 LAB — VITAMIN D 25 HYDROXY (VIT D DEFICIENCY, FRACTURES): VIT D 25 HYDROXY: 33.6 ng/mL (ref 30.0–100.0)

## 2017-08-09 DIAGNOSIS — Z79899 Other long term (current) drug therapy: Secondary | ICD-10-CM | POA: Diagnosis not present

## 2017-08-09 DIAGNOSIS — L940 Localized scleroderma [morphea]: Secondary | ICD-10-CM | POA: Diagnosis not present

## 2017-09-19 DIAGNOSIS — M9901 Segmental and somatic dysfunction of cervical region: Secondary | ICD-10-CM | POA: Diagnosis not present

## 2017-09-19 DIAGNOSIS — M531 Cervicobrachial syndrome: Secondary | ICD-10-CM | POA: Diagnosis not present

## 2017-09-19 DIAGNOSIS — M9902 Segmental and somatic dysfunction of thoracic region: Secondary | ICD-10-CM | POA: Diagnosis not present

## 2017-10-13 DIAGNOSIS — M531 Cervicobrachial syndrome: Secondary | ICD-10-CM | POA: Diagnosis not present

## 2017-10-13 DIAGNOSIS — M9901 Segmental and somatic dysfunction of cervical region: Secondary | ICD-10-CM | POA: Diagnosis not present

## 2017-10-13 DIAGNOSIS — M9902 Segmental and somatic dysfunction of thoracic region: Secondary | ICD-10-CM | POA: Diagnosis not present

## 2017-11-14 ENCOUNTER — Other Ambulatory Visit (HOSPITAL_COMMUNITY)
Admission: RE | Admit: 2017-11-14 | Discharge: 2017-11-14 | Disposition: A | Discharge status: Home / Self Care | Payer: BLUE CROSS/BLUE SHIELD | Source: Ambulatory Visit | Attending: Obstetrics & Gynecology | Admitting: Obstetrics & Gynecology

## 2017-11-14 ENCOUNTER — Ambulatory Visit (INDEPENDENT_AMBULATORY_CARE_PROVIDER_SITE_OTHER): Payer: BLUE CROSS/BLUE SHIELD | Admitting: Certified Nurse Midwife

## 2017-11-14 ENCOUNTER — Encounter: Payer: Self-pay | Admitting: Certified Nurse Midwife

## 2017-11-14 ENCOUNTER — Other Ambulatory Visit: Payer: Self-pay

## 2017-11-14 VITALS — BP 110/70 | HR 70 | Resp 16 | Ht 61.75 in | Wt 184.0 lb

## 2017-11-14 DIAGNOSIS — L900 Lichen sclerosus et atrophicus: Secondary | ICD-10-CM | POA: Diagnosis not present

## 2017-11-14 DIAGNOSIS — Z124 Encounter for screening for malignant neoplasm of cervix: Secondary | ICD-10-CM | POA: Insufficient documentation

## 2017-11-14 DIAGNOSIS — Z01419 Encounter for gynecological examination (general) (routine) without abnormal findings: Secondary | ICD-10-CM | POA: Diagnosis not present

## 2017-11-14 DIAGNOSIS — E559 Vitamin D deficiency, unspecified: Secondary | ICD-10-CM

## 2017-11-14 NOTE — Progress Notes (Signed)
58 y.o. [id number] Married  Caucasian Fe here for annual exam. Menopausal no HRT. Denies vaginal bleeding. Continues with vaginal dryness at times using OTC options at present. Went to  [facility] for Lichen Sclerosis of breast and had biopsy for confirmation. Now on Plaquenil and had all labs but Vitamin D done there. Seeing them frequently. Lichen now is decreasing in flares on breast and has not noted anywhere else. No other health issues today.  Patient's last menstrual period was 08/18/2013.          Sexually active: Yes.    The current method of family planning is tubal ligation.    Exercising: Yes.    walking Smoker:  no  Health Maintenance: Pap:  11-03-14 neg, 11-10-16 neg HPV HR neg History of Abnormal Pap: no MMG:  05-25-17 category b density birads 1:neg Self Breast exams: yes Colonoscopy:  2018 negative 10 years BMD:  none TDaP:  2013 Shingles: no Pneumonia: no Hep C and HIV: hep c neg 2018 Labs: Vitamin D   reports that she has never smoked. She has never used smokeless tobacco. She reports that she drinks about 1.2 - 2.4 oz of alcohol per week. She reports that she does not use drugs.  Past Medical History:  Diagnosis Date  . Breast mass, right   . Cervical polyp 04/09/15   2 small polyps on ultrasound  . Hypertension   . Kidney stones   . Lichen sclerosus   . Shingles     Past Surgical History:  Procedure Laterality Date  . BREAST EXCISIONAL BIOPSY Right 06/2015  . BREAST LUMPECTOMY WITH RADIOACTIVE SEED LOCALIZATION Right 06/25/2015   Procedure: RIGHT BREAST SEED LOCALIZATION LUMPECTOMY ;  Surgeon: Harriette Bouillon, MD;  Location: [facility];  Service: General;  Laterality: Right;  . CYSTECTOMY  1999   ovarian cystectomy  . NASAL SINUS SURGERY    . TUBAL LIGATION     BTSP    Current Outpatient Medications  Medication Sig Dispense Refill  . carisoprodol (SOMA) 350 MG tablet carisoprodol 350 mg tablet    . clotrimazole-betamethasone (LOTRISONE) cream  Apply 1 application topically 2 (two) times daily. 30 g 0  . conjugated estrogens (PREMARIN) vaginal cream Place 1 Applicatorful vaginally daily. use 1/2 g vaginally two or three times per week as needed to maintain symptom relief. 30 g 3  . hydrochlorothiazide (HYDRODIURIL) 25 MG tablet daily.     . meloxicam (MOBIC) 15 MG tablet Take 1 tablet (15 mg total) by mouth daily. 30 tablet 2  . mometasone (ELOCON) 0.1 % cream apply to affected area twice a day  0  . olmesartan (BENICAR) 40 MG tablet Take 40 mg by mouth daily.    Marland Kitchen omeprazole (PRILOSEC) 40 MG capsule   1  . Vitamin D, Ergocalciferol, (DRISDOL) 50000 units CAPS capsule Take 1 capsule (50,000 Units total) by mouth every 7 (seven) days. 12 capsule 0   No current facility-administered medications for this visit.     Family History  Problem Relation Age of Onset  . Hypertension Mother   . Breast cancer Mother   . Diabetes Paternal Grandmother   . Diabetes Paternal Grandfather     ROS:  Pertinent items are noted in HPI.  Otherwise, a comprehensive ROS was negative.  Exam:   LMP 08/18/2013    Ht Readings from Last 3 Encounters:  11/10/16 5' 1.75" (1.568 m)  04/19/16 5' 1.25" (1.556 m)  04/05/16 5' 1.25" (1.556 m)    General appearance:  alert, cooperative and appears stated age Head: Normocephalic, without obvious abnormality, atraumatic Neck: no adenopathy, supple, symmetrical, trachea midline and thyroid normal to inspection and palpation Lungs: clear to auscultation bilaterally Breasts: normal appearance, no masses or tenderness, No nipple retraction or dimpling, No nipple discharge or bleeding, No axillary or supraclavicular adenopathy, lichen pigmentation changes bilateral on both breasts Heart: regular rate and rhythm Abdomen: soft, non-tender; no masses,  no organomegaly Extremities: extremities normal, atraumatic, no cyanosis or edema Skin: Skin color, texture, turgor normal. No rashes or lesions Lymph nodes:  Cervical, supraclavicular, and axillary nodes normal. No abnormal inguinal nodes palpated Neurologic: Grossly normal   Pelvic: External genitalia:  no lesions              Urethra:  normal appearing urethra with no masses, tenderness or lesions              Bartholin's and Skene's: normal                 Vagina: normal appearing vagina with normal color and discharge, no lesions              Cervix: no bleeding following Pap, no cervical motion tenderness and no lesions              Pap taken: Yes.   Bimanual Exam:  Uterus:  normal size, contour, position, consistency, mobility, non-tender              Adnexa: normal adnexa and no mass, fullness, tenderness               Rectovaginal: Confirms               Anus:  normal sphincter tone, no lesions  Chaperone present: yes  A:  Well Woman with normal exam  Menopausal no HRT  Lichen Sclerosus of breast bilateral with management at [facility] on Plaquenil  Hypertension/cholesterol with management at [facility]  Vaginal dryness with good response to OTC product use  Vitamin D deficiency    P:   Reviewed health and wellness pertinent to exam  Aware of need to advise if vaginal bleeding or changes in skin in vulva area.  Continue follow up at [name] as indicated  Lab: Vitamin D   Pap smear: yes   counseled on breast self exam, mammography screening, feminine hygiene, adequate intake of calcium and vitamin D, diet and exercise  return annually or prn  An After Visit Summary was printed and given to the patient.

## 2017-11-14 NOTE — Patient Instructions (Signed)

## 2017-11-15 LAB — VITAMIN D 25 HYDROXY (VIT D DEFICIENCY, FRACTURES): Vit D, 25-Hydroxy: 38.5 ng/mL (ref 30.0–100.0)

## 2017-11-16 LAB — CYTOLOGY - PAP: Diagnosis: NEGATIVE

## 2017-11-23 DIAGNOSIS — Z79899 Other long term (current) drug therapy: Secondary | ICD-10-CM | POA: Diagnosis not present

## 2017-11-27 DIAGNOSIS — M5386 Other specified dorsopathies, lumbar region: Secondary | ICD-10-CM | POA: Diagnosis not present

## 2017-11-27 DIAGNOSIS — S39012A Strain of muscle, fascia and tendon of lower back, initial encounter: Secondary | ICD-10-CM | POA: Diagnosis not present

## 2017-11-27 DIAGNOSIS — M5442 Lumbago with sciatica, left side: Secondary | ICD-10-CM | POA: Diagnosis not present

## 2017-11-27 DIAGNOSIS — M9901 Segmental and somatic dysfunction of cervical region: Secondary | ICD-10-CM | POA: Diagnosis not present

## 2017-11-29 DIAGNOSIS — M62838 Other muscle spasm: Secondary | ICD-10-CM | POA: Diagnosis not present

## 2017-11-29 DIAGNOSIS — M5030 Other cervical disc degeneration, unspecified cervical region: Secondary | ICD-10-CM | POA: Diagnosis not present

## 2017-11-29 DIAGNOSIS — M542 Cervicalgia: Secondary | ICD-10-CM | POA: Diagnosis not present

## 2017-11-29 DIAGNOSIS — M25512 Pain in left shoulder: Secondary | ICD-10-CM | POA: Diagnosis not present

## 2017-11-29 DIAGNOSIS — M5032 Other cervical disc degeneration, mid-cervical region, unspecified level: Secondary | ICD-10-CM | POA: Diagnosis not present

## 2017-11-29 DIAGNOSIS — M4312 Spondylolisthesis, cervical region: Secondary | ICD-10-CM | POA: Diagnosis not present

## 2017-11-29 DIAGNOSIS — M47812 Spondylosis without myelopathy or radiculopathy, cervical region: Secondary | ICD-10-CM | POA: Diagnosis not present

## 2017-12-06 DIAGNOSIS — L900 Lichen sclerosus et atrophicus: Secondary | ICD-10-CM | POA: Diagnosis not present

## 2017-12-06 DIAGNOSIS — L940 Localized scleroderma [morphea]: Secondary | ICD-10-CM | POA: Diagnosis not present

## 2017-12-06 DIAGNOSIS — Z79899 Other long term (current) drug therapy: Secondary | ICD-10-CM | POA: Diagnosis not present

## 2017-12-12 DIAGNOSIS — L900 Lichen sclerosus et atrophicus: Secondary | ICD-10-CM | POA: Insufficient documentation

## 2017-12-14 DIAGNOSIS — I10 Essential (primary) hypertension: Secondary | ICD-10-CM | POA: Diagnosis not present

## 2017-12-14 DIAGNOSIS — E782 Mixed hyperlipidemia: Secondary | ICD-10-CM | POA: Insufficient documentation

## 2018-01-04 DIAGNOSIS — H9319 Tinnitus, unspecified ear: Secondary | ICD-10-CM | POA: Diagnosis not present

## 2018-01-04 DIAGNOSIS — J301 Allergic rhinitis due to pollen: Secondary | ICD-10-CM | POA: Diagnosis not present

## 2018-01-04 DIAGNOSIS — H6980 Other specified disorders of Eustachian tube, unspecified ear: Secondary | ICD-10-CM | POA: Diagnosis not present

## 2018-01-23 DIAGNOSIS — Z86010 Personal history of colonic polyps: Secondary | ICD-10-CM | POA: Diagnosis not present

## 2018-01-23 DIAGNOSIS — K5904 Chronic idiopathic constipation: Secondary | ICD-10-CM | POA: Diagnosis not present

## 2018-01-23 DIAGNOSIS — K219 Gastro-esophageal reflux disease without esophagitis: Secondary | ICD-10-CM | POA: Diagnosis not present

## 2018-03-14 DIAGNOSIS — Z79899 Other long term (current) drug therapy: Secondary | ICD-10-CM | POA: Diagnosis not present

## 2018-03-14 DIAGNOSIS — L940 Localized scleroderma [morphea]: Secondary | ICD-10-CM | POA: Diagnosis not present

## 2018-03-23 DIAGNOSIS — L940 Localized scleroderma [morphea]: Secondary | ICD-10-CM | POA: Diagnosis not present

## 2018-03-23 DIAGNOSIS — D225 Melanocytic nevi of trunk: Secondary | ICD-10-CM | POA: Diagnosis not present

## 2018-03-23 DIAGNOSIS — L308 Other specified dermatitis: Secondary | ICD-10-CM | POA: Diagnosis not present

## 2018-03-23 DIAGNOSIS — D485 Neoplasm of uncertain behavior of skin: Secondary | ICD-10-CM | POA: Diagnosis not present

## 2018-03-28 DIAGNOSIS — N3090 Cystitis, unspecified without hematuria: Secondary | ICD-10-CM | POA: Diagnosis not present

## 2018-03-28 DIAGNOSIS — R300 Dysuria: Secondary | ICD-10-CM | POA: Diagnosis not present

## 2018-03-30 DIAGNOSIS — Z79899 Other long term (current) drug therapy: Secondary | ICD-10-CM | POA: Diagnosis not present

## 2018-04-04 DIAGNOSIS — L940 Localized scleroderma [morphea]: Secondary | ICD-10-CM | POA: Diagnosis not present

## 2018-04-17 DIAGNOSIS — L940 Localized scleroderma [morphea]: Secondary | ICD-10-CM | POA: Diagnosis not present

## 2018-04-19 DIAGNOSIS — L940 Localized scleroderma [morphea]: Secondary | ICD-10-CM | POA: Diagnosis not present

## 2018-04-20 ENCOUNTER — Other Ambulatory Visit: Payer: Self-pay | Admitting: Obstetrics and Gynecology

## 2018-04-20 DIAGNOSIS — Z1231 Encounter for screening mammogram for malignant neoplasm of breast: Secondary | ICD-10-CM

## 2018-04-25 DIAGNOSIS — L940 Localized scleroderma [morphea]: Secondary | ICD-10-CM | POA: Diagnosis not present

## 2018-04-27 DIAGNOSIS — L940 Localized scleroderma [morphea]: Secondary | ICD-10-CM | POA: Diagnosis not present

## 2018-05-01 DIAGNOSIS — L940 Localized scleroderma [morphea]: Secondary | ICD-10-CM | POA: Diagnosis not present

## 2018-05-03 ENCOUNTER — Encounter: Payer: Self-pay | Admitting: Primary Care

## 2018-05-03 ENCOUNTER — Ambulatory Visit (INDEPENDENT_AMBULATORY_CARE_PROVIDER_SITE_OTHER): Payer: BLUE CROSS/BLUE SHIELD | Admitting: Primary Care

## 2018-05-03 VITALS — BP 122/78 | HR 90 | Temp 98.0°F | Ht 61.5 in | Wt 181.5 lb

## 2018-05-03 DIAGNOSIS — R739 Hyperglycemia, unspecified: Secondary | ICD-10-CM | POA: Insufficient documentation

## 2018-05-03 DIAGNOSIS — I10 Essential (primary) hypertension: Secondary | ICD-10-CM | POA: Diagnosis not present

## 2018-05-03 DIAGNOSIS — L940 Localized scleroderma [morphea]: Secondary | ICD-10-CM

## 2018-05-03 DIAGNOSIS — E785 Hyperlipidemia, unspecified: Secondary | ICD-10-CM | POA: Diagnosis not present

## 2018-05-03 DIAGNOSIS — Z23 Encounter for immunization: Secondary | ICD-10-CM | POA: Diagnosis not present

## 2018-05-03 LAB — BASIC METABOLIC PANEL
BUN: 16 mg/dL (ref 6–23)
CALCIUM: 9.7 mg/dL (ref 8.4–10.5)
CO2: 29 mEq/L (ref 19–32)
Chloride: 98 mEq/L (ref 96–112)
Creatinine, Ser: 1.01 mg/dL (ref 0.40–1.20)
GFR: 60 mL/min — AB (ref >60.00)
Glucose, Bld: 116 mg/dL — ABNORMAL HIGH (ref 70–99)
POTASSIUM: 3.7 meq/L (ref 3.5–5.1)
SODIUM: 135 meq/L (ref 135–145)

## 2018-05-03 LAB — LIPID PANEL
CHOL/HDL RATIO: 3
CHOLESTEROL: 166 mg/dL (ref 0–200)
HDL: 58.9 mg/dL (ref >39.00)
LDL CALC: 85 mg/dL (ref 0–99)
NONHDL: 107.55
Triglycerides: 115 mg/dL (ref 0.0–149.0)
VLDL: 23 mg/dL (ref 0.0–40.0)

## 2018-05-03 LAB — HEMOGLOBIN A1C: Hgb A1c MFr Bld: 5.6 % (ref 4.6–6.5)

## 2018-05-03 NOTE — Progress Notes (Signed)
Subjective:    Patient ID: Raven Romero, female    DOB: 08-29-1960, 57 y.o.   MRN: [id number]  HPI  Raven Romero is a 57 year old female who presents today to establish care and discuss the problems mentioned below. Will obtain old records. Follows with gynecology.   1) Essential Hypertension: Currently managed on HCTZ 25 mg and olmesartan 40 mg. She denies chest pain, shortness of breath, dizziness.   BP Readings from Last 3 Encounters:  05/03/18 122/78  11/14/17 110/70  11/10/16 110/64     2) Morphea: Following with dermatology through [facility] and currently managed on hydroxychloroquine 200 mg. Symptoms initially presented as a rash to her breast, anterior chest, then posterior trunk. She is active with light box therapy in [place].   3) Hyperlipidemia: Currently not managed on medication. Last lipid panel was in October 2018 with TC of 200, LDL of 112, Trigs of 167, HDL of 55. She recently met with a nutritionist and is motivated to work on weight loss.   4) Hyperglycemia: Noted on several labs from prior MD offices. She recently met with a nutritionist and is working on changing her diet and regular exercise.  Review of Systems  Eyes: Negative for visual disturbance.  Respiratory: Negative for shortness of breath.   Cardiovascular: Negative for chest pain.  Skin: Positive for rash.  Neurological: Negative for dizziness, numbness and headaches.       Past Medical History:  Diagnosis Date  . Breast mass, right   . Cervical polyp 04/09/15   2 small polyps on ultrasound  . Hypertension   . Kidney stones   . Lichen sclerosus   . Morphea   . Shingles      Social History   Socioeconomic History  . Marital status: Married    Spouse name: Not on file  . Number of children: Not on file  . Years of education: Not on file  . Highest education level: Not on file  Occupational History  . Not on file  Social Needs  . Financial resource strain: Not on file  . Food  insecurity:    Worry: Not on file    Inability: Not on file  . Transportation needs:    Medical: Not on file    Non-medical: Not on file  Tobacco Use  . Smoking status: Never Smoker  . Smokeless tobacco: Never Used  Substance and Sexual Activity  . Alcohol use: Yes    Alcohol/week: 6.0 standard drinks    Types: 6 Standard drinks or equivalent per week  . Drug use: No  . Sexual activity: Yes    Partners: Male    Birth control/protection: Surgical    Comment: btsp  Lifestyle  . Physical activity:    Days per week: Not on file    Minutes per session: Not on file  . Stress: Not on file  Relationships  . Social connections:    Talks on phone: Not on file    Gets together: Not on file    Attends religious service: Not on file    Active member of club or organization: Not on file    Attends meetings of clubs or organizations: Not on file    Relationship status: Not on file  . Intimate partner violence:    Fear of current or ex partner: Not on file    Emotionally abused: Not on file    Physically abused: Not on file    Forced sexual activity: Not on  file  Other Topics Concern  . Not on file  Social History Narrative   Married.    1 child, 2 grandchildren.   Works in Science writer.   Enjoys traveling to the beach.     Past Surgical History:  Procedure Laterality Date  . BREAST EXCISIONAL BIOPSY Right 06/2015  . BREAST LUMPECTOMY WITH RADIOACTIVE SEED LOCALIZATION Right 06/25/2015   Procedure: RIGHT BREAST SEED LOCALIZATION LUMPECTOMY ;  Surgeon: Erroll Luna, MD;  Location: [place];  Service: General;  Laterality: Right;  . CYSTECTOMY  1999   ovarian cystectomy  . NASAL SINUS SURGERY    . TUBAL LIGATION     BTSP    Family History  Problem Relation Age of Onset  . Hypertension Mother   . Breast cancer Mother   . Heart disease Mother   . Diabetes Paternal Grandmother   . Diabetes Paternal Grandfather   . Diabetes Father   . Heart disease Father      Allergies  Allergen Reactions  . Macrobid  [Nitrofurantoin] Nausea Only  . Other     Medrol Dosepak - pt stated, "Made face feel like it was burning from the inside out; face was blood red"    Current Outpatient Medications on File Prior to Visit  Medication Sig Dispense Refill  . Cholecalciferol (VITAMIN D PO) Take 2,000 Int'l Units by mouth.    . hydrochlorothiazide (HYDRODIURIL) 25 MG tablet daily.     . hydroxychloroquine (PLAQUENIL) 200 MG tablet     . olmesartan (BENICAR) 40 MG tablet Take 40 mg by mouth daily.    Marland Kitchen omeprazole (PRILOSEC) 40 MG capsule Take 40 mg by mouth daily.   1  . mometasone (ELOCON) 0.1 % cream apply to affected area twice a day  0   No current facility-administered medications on file prior to visit.     BP 122/78   Pulse 90   Temp 98 F (36.7 C) (Oral)   Ht 5' 1.5" (1.562 m)   Wt 181 lb 8 oz (82.3 kg)   LMP 08/18/2013   SpO2 98%   BMI 33.74 kg/m    Objective:   Physical Exam  Constitutional: She appears well-nourished.  Neck: Neck supple.  Cardiovascular: Normal rate and regular rhythm.  Respiratory: Effort normal and breath sounds normal.  Skin: Skin is warm and dry.  Morphea rash to right shoulder, left breast           Assessment & Plan:

## 2018-05-03 NOTE — Assessment & Plan Note (Signed)
Repeat lipid panel pending. Encouraged her to continue to work on diet and exercise.

## 2018-05-03 NOTE — Addendum Note (Signed)
Addended by: Tawnya Crook on: 05/03/2018 02:18 PM   Modules accepted: Orders

## 2018-05-03 NOTE — Assessment & Plan Note (Signed)
Stable in the office today, continue current regimen. BMP pending. 

## 2018-05-03 NOTE — Assessment & Plan Note (Signed)
Noted on labs from several visits with other providers. Family history of diabetes in Father and her father's side of the family. A1C pending.

## 2018-05-03 NOTE — Assessment & Plan Note (Addendum)
Following with dermatology through [facility]. Continue hydroxychloroquine as prescribed. Continue light box therapy.

## 2018-05-03 NOTE — Patient Instructions (Signed)
Stop by the lab prior to leaving today. I will notify you of your results once received.   Start exercising. You should be getting 150 minutes of moderate intensity exercise weekly.  Continue to work on [employer]. Increase vegetables, fruit, whole grains, lean protein.  Ensure you are consuming 64 ounces of water daily.  It was a pleasure to meet you today! Please don't hesitate to call or message me with any questions. Welcome to [facility]!

## 2018-05-08 DIAGNOSIS — L940 Localized scleroderma [morphea]: Secondary | ICD-10-CM | POA: Diagnosis not present

## 2018-05-15 DIAGNOSIS — J0100 Acute maxillary sinusitis, unspecified: Secondary | ICD-10-CM | POA: Diagnosis not present

## 2018-05-15 DIAGNOSIS — L940 Localized scleroderma [morphea]: Secondary | ICD-10-CM | POA: Diagnosis not present

## 2018-05-17 DIAGNOSIS — L940 Localized scleroderma [morphea]: Secondary | ICD-10-CM | POA: Diagnosis not present

## 2018-05-22 DIAGNOSIS — L940 Localized scleroderma [morphea]: Secondary | ICD-10-CM | POA: Diagnosis not present

## 2018-05-24 DIAGNOSIS — L940 Localized scleroderma [morphea]: Secondary | ICD-10-CM | POA: Diagnosis not present

## 2018-05-28 ENCOUNTER — Ambulatory Visit
Admission: RE | Admit: 2018-05-28 | Discharge: 2018-05-28 | Disposition: A | Discharge status: Home / Self Care | Payer: BLUE CROSS/BLUE SHIELD | Source: Ambulatory Visit | Attending: Obstetrics and Gynecology | Admitting: Obstetrics and Gynecology

## 2018-05-28 DIAGNOSIS — Z1231 Encounter for screening mammogram for malignant neoplasm of breast: Secondary | ICD-10-CM

## 2018-05-28 DIAGNOSIS — L940 Localized scleroderma [morphea]: Secondary | ICD-10-CM | POA: Diagnosis not present

## 2018-05-30 DIAGNOSIS — L940 Localized scleroderma [morphea]: Secondary | ICD-10-CM | POA: Diagnosis not present

## 2018-06-05 DIAGNOSIS — L940 Localized scleroderma [morphea]: Secondary | ICD-10-CM | POA: Diagnosis not present

## 2018-06-07 DIAGNOSIS — L940 Localized scleroderma [morphea]: Secondary | ICD-10-CM | POA: Diagnosis not present

## 2018-06-08 DIAGNOSIS — L940 Localized scleroderma [morphea]: Secondary | ICD-10-CM | POA: Diagnosis not present

## 2018-06-08 DIAGNOSIS — Z79899 Other long term (current) drug therapy: Secondary | ICD-10-CM | POA: Diagnosis not present

## 2018-06-08 DIAGNOSIS — H2513 Age-related nuclear cataract, bilateral: Secondary | ICD-10-CM | POA: Diagnosis not present

## 2018-06-08 DIAGNOSIS — H35033 Hypertensive retinopathy, bilateral: Secondary | ICD-10-CM | POA: Diagnosis not present

## 2018-06-12 DIAGNOSIS — L940 Localized scleroderma [morphea]: Secondary | ICD-10-CM | POA: Diagnosis not present

## 2018-06-19 DIAGNOSIS — L940 Localized scleroderma [morphea]: Secondary | ICD-10-CM | POA: Diagnosis not present

## 2018-06-19 DIAGNOSIS — M19032 Primary osteoarthritis, left wrist: Secondary | ICD-10-CM | POA: Diagnosis not present

## 2018-06-20 DIAGNOSIS — L940 Localized scleroderma [morphea]: Secondary | ICD-10-CM | POA: Diagnosis not present

## 2018-06-20 DIAGNOSIS — Z79899 Other long term (current) drug therapy: Secondary | ICD-10-CM | POA: Diagnosis not present

## 2018-06-20 DIAGNOSIS — L900 Lichen sclerosus et atrophicus: Secondary | ICD-10-CM | POA: Diagnosis not present

## 2018-06-25 DIAGNOSIS — L940 Localized scleroderma [morphea]: Secondary | ICD-10-CM | POA: Diagnosis not present

## 2018-06-27 DIAGNOSIS — L940 Localized scleroderma [morphea]: Secondary | ICD-10-CM | POA: Diagnosis not present

## 2018-07-03 DIAGNOSIS — L940 Localized scleroderma [morphea]: Secondary | ICD-10-CM | POA: Diagnosis not present

## 2018-07-31 ENCOUNTER — Ambulatory Visit (INDEPENDENT_AMBULATORY_CARE_PROVIDER_SITE_OTHER): Payer: BLUE CROSS/BLUE SHIELD | Admitting: Primary Care

## 2018-07-31 ENCOUNTER — Encounter: Payer: Self-pay | Admitting: Primary Care

## 2018-07-31 VITALS — BP 122/68 | HR 81 | Temp 98.0°F | Wt 186.2 lb

## 2018-07-31 DIAGNOSIS — R05 Cough: Secondary | ICD-10-CM | POA: Diagnosis not present

## 2018-07-31 DIAGNOSIS — R059 Cough, unspecified: Secondary | ICD-10-CM

## 2018-07-31 MED ORDER — METHYLPREDNISOLONE ACETATE 80 MG/ML IJ SUSP
80.0000 mg | Freq: Once | INTRAMUSCULAR | Status: AC
  Administered 2018-07-31: 80 mg via INTRAMUSCULAR

## 2018-07-31 NOTE — Progress Notes (Signed)
Subjective:    Patient ID: Raven Romero, female    DOB: [birth year], 58 y.o.   MRN: [id number]  HPI  Raven Romero is a 58 year old female who presents today with a chief complaint of cough.  She also reports body aches, nasal congestion, chest congestion. Her symptoms began with body aches two days ago, yesterday had nasal congestion and cough. She found blood into the left ear on Christmas day while cleaning out her ear, this was a one time incident. She's had some intermittent ear pain since then.  She denies fevers. She's taken OTC decongestant, Flonase, Mucinex without much improvement.   Review of Systems  Constitutional: Negative for fatigue and fever.  HENT: Positive for congestion, postnasal drip and sinus pressure. Negative for sore throat.   Respiratory: Positive for cough. Negative for shortness of breath.        Past Medical History:  Diagnosis Date  . Breast mass, right   . Cervical polyp 04/09/15   2 small polyps on ultrasound  . Hypertension   . Kidney stones   . Lichen sclerosus   . Morphea   . Shingles      Social History   Socioeconomic History  . Marital status: Married    Spouse name: Not on file  . Number of children: Not on file  . Years of education: Not on file  . Highest education level: Not on file  Occupational History  . Not on file  Social Needs  . Financial resource strain: Not on file  . Food insecurity:    Worry: Not on file    Inability: Not on file  . Transportation needs:    Medical: Not on file    Non-medical: Not on file  Tobacco Use  . Smoking status: Never Smoker  . Smokeless tobacco: Never Used  Substance and Sexual Activity  . Alcohol use: Yes    Alcohol/week: 6.0 standard drinks    Types: 6 Standard drinks or equivalent per week  . Drug use: No  . Sexual activity: Yes    Partners: Male    Birth control/protection: Surgical    Comment: btsp  Lifestyle  . Physical activity:    Days per week: Not on file    Minutes  per session: Not on file  . Stress: Not on file  Relationships  . Social connections:    Talks on phone: Not on file    Gets together: Not on file    Attends religious service: Not on file    Active member of club or organization: Not on file    Attends meetings of clubs or organizations: Not on file    Relationship status: Not on file  . Intimate partner violence:    Fear of current or ex partner: Not on file    Emotionally abused: Not on file    Physically abused: Not on file    Forced sexual activity: Not on file  Other Topics Concern  . Not on file  Social History Narrative   Married.    1 child, 2 grandchildren.   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   Enjoys traveling to the beach.     Past Surgical History:  Procedure Laterality Date  . BREAST EXCISIONAL BIOPSY Right 06/2015  . BREAST LUMPECTOMY WITH RADIOACTIVE SEED LOCALIZATION Right 06/25/2015   Procedure: RIGHT BREAST SEED LOCALIZATION LUMPECTOMY ;  Surgeon: Harriette Bouillon, MD;  Location: [facility];  Service: General;  Laterality: Right;  . CYSTECTOMY  1999   ovarian cystectomy  . NASAL SINUS SURGERY    . TUBAL LIGATION     BTSP    Family History  Problem Relation Age of Onset  . Hypertension Mother   . Breast cancer Mother   . Heart disease Mother   . Diabetes Paternal Grandmother   . Diabetes Paternal Grandfather   . Diabetes Father   . Heart disease Father     Allergies  Allergen Reactions  . Macrobid  [Nitrofurantoin] Nausea Only  . Other     Medrol Dosepak - pt stated, "Made face feel like it was burning from the inside out; face was blood red"    Current Outpatient Medications on File Prior to Visit  Medication Sig Dispense Refill  . Cholecalciferol (VITAMIN D PO) Take 2,000 Int'l Units by mouth.    . hydrochlorothiazide (HYDRODIURIL) 25 MG tablet daily.     . hydroxychloroquine (PLAQUENIL) 200 MG tablet     . mometasone (ELOCON) 0.1 % cream apply to affected area twice a day  0  . olmesartan  (BENICAR) 40 MG tablet Take 40 mg by mouth daily.    Marland Kitchen. omeprazole (PRILOSEC) 40 MG capsule Take 40 mg by mouth daily.   1   No current facility-administered medications on file prior to visit.     BP 122/68 (BP Location: Right Arm, Patient Position: Sitting, Cuff Size: Normal)   Pulse 81   Temp 98 F (36.7 C) (Oral)   Wt 186 lb 4 oz (84.5 kg)   LMP 08/18/2013   SpO2 97%   BMI 34.62 kg/m    Objective:   Physical Exam  Constitutional: She appears well-nourished. She does not appear ill.  HENT:  Right Ear: Tympanic membrane and ear canal normal.  Left Ear: Tympanic membrane and ear canal normal.  Nose: Mucosal edema present. Right sinus exhibits no maxillary sinus tenderness and no frontal sinus tenderness. Left sinus exhibits no maxillary sinus tenderness and no frontal sinus tenderness.  Mouth/Throat: Oropharynx is clear and moist.  Neck: Neck supple.  Cardiovascular: Normal rate and regular rhythm.  Respiratory: Effort normal and breath sounds normal. She has no wheezes.  Skin: Skin is warm and dry.           Assessment & Plan:  Viral URI:  Two day history of symptoms. Exam today overall unremarkable. Suspect viral URI vs allergy involvement. Continue Flonase. Discussed to stop decongestant after five days.  IM Depo Medrol 80 mg provided, she has had injections of steroids in the past and did well. Dicussed use of Tylenol and Zyrtec. Return precautions provided.  Doreene NestKatherine K Claudeen Leason, NP

## 2018-07-31 NOTE — Patient Instructions (Signed)
Your symptoms are representative of a viral illness which will resolve on its own over time. Our goal is to treat your symptoms in order to aid your body in the healing process and to make you more comfortable.   Start Tylenol tablets for body aches, headaches.   Continue using Flonase for nasal congestion/sinus pressure.   Start an antihistamine such as Zyrtec or Xyzal.  Please notify me if you develop persistent fevers of 101, notice increased fatigue or weakness, and/or feel worse after 1 week of onset of symptoms.   Increase consumption of water intake and rest.  It was a pleasure to see you today!

## 2018-08-06 ENCOUNTER — Telehealth: Payer: Self-pay | Admitting: Primary Care

## 2018-08-06 DIAGNOSIS — J069 Acute upper respiratory infection, unspecified: Secondary | ICD-10-CM

## 2018-08-06 NOTE — Telephone Encounter (Addendum)
Please call patient and tell her I'm sorry to hear about her continued congestion. Need a little more information.   Any fevers? Cough? Chest congestion? Is this the congestion for which she's referring to? Has anything improved? What's the most bothersome symptom?

## 2018-08-06 NOTE — Telephone Encounter (Signed)
Pt called office due to being seen on 1/14. She has been taking mucinex and otc medicine. The congestion has not improved. Pt was told by Jae Dire to call back if symptoms did not improve.

## 2018-08-07 MED ORDER — AMOXICILLIN 875 MG PO TABS: 875.0000 mg | ORAL_TABLET | Freq: Two times a day (BID) | ORAL | 0 refills | Status: DC

## 2018-08-07 NOTE — Telephone Encounter (Signed)
Pt is not running fevers; pt has terrible chest congestion and prod cough with green-yellow phlegm; pt cannot see any improvement but feels worse in general. Pt said chest congestion and prod cough is most bothersome symptoms. No wheezing or difficulty breathing. Pt thinks she needs abx such as amoxicillin. Pt has taken 2 bottles of mucinex. [facility] [address]. Pt request cb after Allayne GitelmanK Clark NP reviews note.

## 2018-08-07 NOTE — Telephone Encounter (Addendum)
Noted. Please notify patient that I sent in a prescription for amoxicillin to her pharmacy. Start amoxicillin antibiotics for the infection. Take 1 tablet by mouth twice daily for 7 days.

## 2018-08-07 NOTE — Addendum Note (Signed)
Addended by: Doreene Nest on: 08/07/2018 01:36 PM   Modules accepted: Orders

## 2018-08-07 NOTE — Telephone Encounter (Signed)
Spoken and notified patient of Raven Romero's comments. Patient verbalized understanding.  

## 2018-08-21 DIAGNOSIS — L940 Localized scleroderma [morphea]: Secondary | ICD-10-CM | POA: Diagnosis not present

## 2018-08-23 DIAGNOSIS — L940 Localized scleroderma [morphea]: Secondary | ICD-10-CM | POA: Diagnosis not present

## 2018-08-28 DIAGNOSIS — L940 Localized scleroderma [morphea]: Secondary | ICD-10-CM | POA: Diagnosis not present

## 2018-09-04 DIAGNOSIS — L940 Localized scleroderma [morphea]: Secondary | ICD-10-CM | POA: Diagnosis not present

## 2018-09-11 DIAGNOSIS — L940 Localized scleroderma [morphea]: Secondary | ICD-10-CM | POA: Diagnosis not present

## 2018-09-18 DIAGNOSIS — L940 Localized scleroderma [morphea]: Secondary | ICD-10-CM | POA: Diagnosis not present

## 2018-09-20 DIAGNOSIS — L940 Localized scleroderma [morphea]: Secondary | ICD-10-CM | POA: Diagnosis not present

## 2018-09-26 DIAGNOSIS — L900 Lichen sclerosus et atrophicus: Secondary | ICD-10-CM | POA: Diagnosis not present

## 2018-09-26 DIAGNOSIS — L940 Localized scleroderma [morphea]: Secondary | ICD-10-CM | POA: Diagnosis not present

## 2018-09-26 DIAGNOSIS — Z79899 Other long term (current) drug therapy: Secondary | ICD-10-CM | POA: Diagnosis not present

## 2018-09-27 DIAGNOSIS — L940 Localized scleroderma [morphea]: Secondary | ICD-10-CM | POA: Diagnosis not present

## 2018-11-20 ENCOUNTER — Ambulatory Visit: Payer: BLUE CROSS/BLUE SHIELD | Admitting: Certified Nurse Midwife

## 2018-12-12 DIAGNOSIS — I10 Essential (primary) hypertension: Secondary | ICD-10-CM | POA: Diagnosis not present

## 2018-12-12 DIAGNOSIS — E785 Hyperlipidemia, unspecified: Secondary | ICD-10-CM | POA: Diagnosis not present

## 2018-12-12 DIAGNOSIS — L940 Localized scleroderma [morphea]: Secondary | ICD-10-CM | POA: Diagnosis not present

## 2019-01-02 ENCOUNTER — Other Ambulatory Visit: Payer: Self-pay

## 2019-01-04 ENCOUNTER — Ambulatory Visit (INDEPENDENT_AMBULATORY_CARE_PROVIDER_SITE_OTHER): Payer: BC Managed Care – PPO | Admitting: Certified Nurse Midwife

## 2019-01-04 ENCOUNTER — Other Ambulatory Visit: Payer: Self-pay

## 2019-01-04 ENCOUNTER — Other Ambulatory Visit (HOSPITAL_COMMUNITY)
Admission: RE | Admit: 2019-01-04 | Discharge: 2019-01-04 | Disposition: A | Discharge status: Home / Self Care | Payer: BC Managed Care – PPO | Source: Ambulatory Visit | Attending: Certified Nurse Midwife | Admitting: Certified Nurse Midwife

## 2019-01-04 ENCOUNTER — Encounter: Payer: Self-pay | Admitting: Certified Nurse Midwife

## 2019-01-04 VITALS — BP 110/72 | HR 70 | Temp 97.6°F | Resp 16 | Ht 62.25 in | Wt 190.0 lb

## 2019-01-04 DIAGNOSIS — Z124 Encounter for screening for malignant neoplasm of cervix: Secondary | ICD-10-CM | POA: Diagnosis not present

## 2019-01-04 DIAGNOSIS — L900 Lichen sclerosus et atrophicus: Secondary | ICD-10-CM

## 2019-01-04 DIAGNOSIS — H9319 Tinnitus, unspecified ear: Secondary | ICD-10-CM | POA: Diagnosis not present

## 2019-01-04 DIAGNOSIS — Z01419 Encounter for gynecological examination (general) (routine) without abnormal findings: Secondary | ICD-10-CM | POA: Diagnosis not present

## 2019-01-04 DIAGNOSIS — N898 Other specified noninflammatory disorders of vagina: Secondary | ICD-10-CM | POA: Diagnosis not present

## 2019-01-04 DIAGNOSIS — Z0000 Encounter for general adult medical examination without abnormal findings: Secondary | ICD-10-CM | POA: Diagnosis not present

## 2019-01-04 DIAGNOSIS — J301 Allergic rhinitis due to pollen: Secondary | ICD-10-CM | POA: Diagnosis not present

## 2019-01-04 LAB — POCT URINALYSIS DIPSTICK
Bilirubin, UA: NEGATIVE
Blood, UA: NEGATIVE
Glucose, UA: NEGATIVE
Ketones, UA: NEGATIVE
Leukocytes, UA: NEGATIVE
Nitrite, UA: NEGATIVE
Protein, UA: NEGATIVE
Urobilinogen, UA: NEGATIVE E.U./dL — AB
pH, UA: 5 (ref 5.0–8.0)

## 2019-01-04 NOTE — Progress Notes (Signed)
58 y.o. G12P1001 Married  Caucasian Fe here for annual exam. Post menopausal, no vaginal bleeding. Has noted some vaginal itching and possible dryness, for the past month. Treated self with OTC yeast medication and has seen some change. Also had some Premarin cream from before and used vaginally and felt better. Continues to see [facility] for Lichen Sclerosis and is doing light therapy at home now. Seeing PCP for hypertension, cholesterol, labs and aex. All stable per patient. No other health issues today.  Patient's last menstrual period was 08/18/2013.          Sexually active: Yes.    The current method of family planning is tubal ligation.    Exercising: Yes.    walking Smoker:  no  Review of Systems  Constitutional: Negative.   HENT: Negative.   Eyes: Negative.   Respiratory: Negative.   Cardiovascular: Negative.   Gastrointestinal: Negative.   Genitourinary: Positive for frequency.  Musculoskeletal: Negative.   Skin: Negative.        Vaginal itching  Neurological: Negative.   Endo/Heme/Allergies: Negative.   Psychiatric/Behavioral: Negative.     Health Maintenance: Pap:  11-10-16 neg HPV HR neg, 11-14-17 neg History of Abnormal Pap: no MMG:  05-28-18 category b density birads 1:neg Self Breast exams: occ Colonoscopy:  2018 neg f/u 75yrs BMD:   none TDaP:  2013 Shingles: no Pneumonia: no Hep C and HIV: Hep c neg 2018 Labs: with PCP has appointment in 01/2019   reports that she has never smoked. She has never used smokeless tobacco. She reports current alcohol use of about 6.0 standard drinks of alcohol per week. She reports that she does not use drugs.  Past Medical History:  Diagnosis Date  . Breast mass, right   . Cervical polyp 04/09/15   2 small polyps on ultrasound  . Hypertension   . Kidney stones   . Lichen sclerosus   . Morphea   . Shingles     Past Surgical History:  Procedure Laterality Date  . BREAST EXCISIONAL BIOPSY Right 06/2015  . BREAST LUMPECTOMY WITH  RADIOACTIVE SEED LOCALIZATION Right 06/25/2015   Procedure: RIGHT BREAST SEED LOCALIZATION LUMPECTOMY ;  Surgeon: Erroll Luna, MD;  Location: [facility];  Service: General;  Laterality: Right;  . CYSTECTOMY  1999   ovarian cystectomy  . NASAL SINUS SURGERY    . TUBAL LIGATION     BTSP    Current Outpatient Medications  Medication Sig Dispense Refill  . Cholecalciferol (VITAMIN D PO) Take 2,000 Int'l Units by mouth.    . hydrochlorothiazide (HYDRODIURIL) 25 MG tablet daily.     Marland Kitchen olmesartan (BENICAR) 40 MG tablet Take 40 mg by mouth daily.    Marland Kitchen omeprazole (PRILOSEC) 40 MG capsule Take 40 mg by mouth daily.   1   No current facility-administered medications for this visit.     Family History  Problem Relation Age of Onset  . Hypertension Mother   . Breast cancer Mother   . Heart disease Mother   . Diabetes Paternal Grandmother   . Diabetes Paternal Grandfather   . Diabetes Father   . Heart disease Father     ROS:  Pertinent items are noted in HPI.  Otherwise, a comprehensive ROS was negative.  Exam:   BP 110/72   Pulse 70   Temp 97.6 F (36.4 C) (Skin)   Resp 16   Ht 5' 2.25" (1.581 m)   Wt 190 lb (86.2 kg)   LMP 08/18/2013  BMI 34.47 kg/m  Height: 5' 2.25" (158.1 cm) Ht Readings from Last 3 Encounters:  01/04/19 5' 2.25" (1.581 m)  05/03/18 5' 1.5" (1.562 m)  11/14/17 5' 1.75" (1.568 m)    General appearance: alert, cooperative and appears stated age Head: Normocephalic, without obvious abnormality, atraumatic Neck: no adenopathy, supple, symmetrical, trachea midline and thyroid normal to inspection and palpation Lungs: clear to auscultation bilaterally Breasts: normal appearance, no masses or tenderness, No nipple retraction or dimpling, No nipple discharge or bleeding, No axillary or supraclavicular adenopathy Heart: regular rate and rhythm Abdomen: soft, non-tender; no masses,  no organomegaly Extremities: extremities normal, atraumatic, no  cyanosis or edema Skin: Skin color, texture, turgor normal. No rashes or lesions Lymph nodes: Cervical, supraclavicular, and axillary nodes normal. No abnormal inguinal nodes palpated Neurologic: Grossly normal   Pelvic: External genitalia:  no lesions, no Lichen noted anywhere in genital or rectal area              Urethra:  normal appearing urethra with no masses, tenderness or lesions              Bartholin's and Skene's: normal                 Vagina: normal appearing vagina with normal color and discharge, no lesions, moisture noted              Cervix: no cervical motion tenderness, no lesions and normal appearance              Pap taken: Yes.   Bimanual Exam:  Uterus:  normal size, contour, position, consistency, mobility, non-tender and anteverted              Adnexa: normal adnexa and no mass, fullness, tenderness               Rectovaginal: Confirms               Anus:  normal sphincter tone, no lesions  Chaperone present: yes  A:  Well Woman with normal exam  Menopausal no HRT  History of Lichen sclerosus on abdomen, arms and in genital area. Using light [facility] with good results [facility]  R/O vaginal infection vs vaginal dryness  Hypertension/cholesterol management with PCP   P:   Reviewed health and wellness pertinent to exam  Aware of need to advise if vaginal bleeding  Discussed no Lichen noted in perineal or vulva area.  Lab: affirm  Continue follow up as indicated with MD.  Pap smear: yes   counseled on breast self exam, mammography screening, feminine hygiene, adequate intake of calcium and vitamin D, diet and exercise  return annually or prn  An After Visit Summary was printed and given to the patient.

## 2019-01-05 LAB — VAGINITIS/VAGINOSIS, DNA PROBE
Candida Species: NEGATIVE
Gardnerella vaginalis: NEGATIVE
Trichomonas vaginosis: NEGATIVE

## 2019-01-08 LAB — CYTOLOGY - PAP
Diagnosis: NEGATIVE
HPV: NOT DETECTED

## 2019-01-25 DIAGNOSIS — L940 Localized scleroderma [morphea]: Secondary | ICD-10-CM | POA: Diagnosis not present

## 2019-02-18 ENCOUNTER — Ambulatory Visit: Payer: BC Managed Care – PPO | Admitting: Podiatry

## 2019-03-07 ENCOUNTER — Telehealth: Payer: Self-pay | Admitting: Primary Care

## 2019-03-07 NOTE — Telephone Encounter (Signed)
Best number [phone]  Pt called stating she faxed voluntary wellness program for  she faxed this yesterday  Please when this has been done

## 2019-03-08 NOTE — Telephone Encounter (Signed)
I have not seen this. It's not in my inbox or on the cart for delivery.

## 2019-03-08 NOTE — Telephone Encounter (Signed)
Spoken to patient and she stated that she will re-fax the form.

## 2019-03-08 NOTE — Telephone Encounter (Signed)
Have not see form? Anda Kraft?

## 2019-03-14 ENCOUNTER — Telehealth: Payer: Self-pay | Admitting: Primary Care

## 2019-03-14 NOTE — Telephone Encounter (Signed)
See phone note I just sent regarding this.

## 2019-03-14 NOTE — Telephone Encounter (Signed)
Received form to complete for CPE.  I've not seen her for CPE, she will need to schedule in order for me to complete the form. Will also need fasting labs.

## 2019-03-14 NOTE — Telephone Encounter (Signed)
Place form in Kate's inbox. 

## 2019-03-30 ENCOUNTER — Other Ambulatory Visit: Payer: Self-pay | Admitting: Primary Care

## 2019-03-30 DIAGNOSIS — E559 Vitamin D deficiency, unspecified: Secondary | ICD-10-CM

## 2019-03-30 DIAGNOSIS — Z1159 Encounter for screening for other viral diseases: Secondary | ICD-10-CM

## 2019-03-30 DIAGNOSIS — I10 Essential (primary) hypertension: Secondary | ICD-10-CM

## 2019-03-30 DIAGNOSIS — E785 Hyperlipidemia, unspecified: Secondary | ICD-10-CM

## 2019-04-01 ENCOUNTER — Telehealth: Payer: Self-pay

## 2019-04-01 NOTE — Telephone Encounter (Signed)
Left detailed VM w COVID screen and back door lab info and front door info   

## 2019-04-03 ENCOUNTER — Other Ambulatory Visit (INDEPENDENT_AMBULATORY_CARE_PROVIDER_SITE_OTHER): Payer: BC Managed Care – PPO

## 2019-04-03 DIAGNOSIS — E785 Hyperlipidemia, unspecified: Secondary | ICD-10-CM | POA: Diagnosis not present

## 2019-04-03 DIAGNOSIS — E559 Vitamin D deficiency, unspecified: Secondary | ICD-10-CM | POA: Diagnosis not present

## 2019-04-03 DIAGNOSIS — Z1159 Encounter for screening for other viral diseases: Secondary | ICD-10-CM

## 2019-04-03 DIAGNOSIS — I10 Essential (primary) hypertension: Secondary | ICD-10-CM | POA: Diagnosis not present

## 2019-04-03 LAB — COMPREHENSIVE METABOLIC PANEL
ALT: 12 U/L (ref 0–35)
AST: 17 U/L (ref 0–37)
Albumin: 4.3 g/dL (ref 3.5–5.2)
Alkaline Phosphatase: 77 U/L (ref 39–117)
BUN: 22 mg/dL (ref 6–23)
CO2: 28 mEq/L (ref 19–32)
Calcium: 9.9 mg/dL (ref 8.4–10.5)
Chloride: 102 mEq/L (ref 96–112)
Creatinine, Ser: 0.96 mg/dL (ref 0.40–1.20)
GFR: 59.66 mL/min — ABNORMAL LOW (ref >60.00)
Glucose, Bld: 113 mg/dL — ABNORMAL HIGH (ref 70–99)
Potassium: 3.9 mEq/L (ref 3.5–5.1)
Sodium: 139 mEq/L (ref 135–145)
Total Bilirubin: 0.5 mg/dL (ref 0.2–1.2)
Total Protein: 7.1 g/dL (ref 6.0–8.3)

## 2019-04-03 LAB — CBC
HCT: 37.5 % (ref 36.0–46.0)
Hemoglobin: 12.4 g/dL (ref 12.0–15.0)
MCHC: 33.2 g/dL (ref 30.0–36.0)
MCV: 95 fl (ref 78.0–100.0)
Platelets: 289 10*3/uL (ref 150.0–400.0)
RBC: 3.95 Mil/uL (ref 3.87–5.11)
RDW: 13.1 % (ref 11.5–15.5)
WBC: 4.2 10*3/uL (ref 4.0–10.5)

## 2019-04-03 LAB — LIPID PANEL
Cholesterol: 179 mg/dL (ref 0–200)
HDL: 58.6 mg/dL (ref >39.00)
LDL Cholesterol: 102 mg/dL — ABNORMAL HIGH (ref 0–99)
NonHDL: 120.8
Total CHOL/HDL Ratio: 3
Triglycerides: 92 mg/dL (ref 0.0–149.0)
VLDL: 18.4 mg/dL (ref 0.0–40.0)

## 2019-04-03 LAB — VITAMIN D 25 HYDROXY (VIT D DEFICIENCY, FRACTURES): VITD: 47.8 ng/mL (ref 30.00–100.00)

## 2019-04-04 LAB — HEPATITIS C ANTIBODY
Hepatitis C Ab: NONREACTIVE
SIGNAL TO CUT-OFF: 0 (ref <1.00)

## 2019-04-10 ENCOUNTER — Encounter: Payer: Self-pay | Admitting: Primary Care

## 2019-04-10 ENCOUNTER — Other Ambulatory Visit: Payer: Self-pay

## 2019-04-10 ENCOUNTER — Ambulatory Visit (INDEPENDENT_AMBULATORY_CARE_PROVIDER_SITE_OTHER): Payer: BC Managed Care – PPO | Admitting: Primary Care

## 2019-04-10 VITALS — BP 114/72 | HR 76 | Temp 97.8°F | Ht 62.25 in | Wt 191.5 lb

## 2019-04-10 DIAGNOSIS — Z23 Encounter for immunization: Secondary | ICD-10-CM | POA: Diagnosis not present

## 2019-04-10 DIAGNOSIS — E785 Hyperlipidemia, unspecified: Secondary | ICD-10-CM | POA: Diagnosis not present

## 2019-04-10 DIAGNOSIS — Z0001 Encounter for general adult medical examination with abnormal findings: Secondary | ICD-10-CM | POA: Insufficient documentation

## 2019-04-10 DIAGNOSIS — H35033 Hypertensive retinopathy, bilateral: Secondary | ICD-10-CM

## 2019-04-10 DIAGNOSIS — R739 Hyperglycemia, unspecified: Secondary | ICD-10-CM | POA: Diagnosis not present

## 2019-04-10 DIAGNOSIS — R300 Dysuria: Secondary | ICD-10-CM | POA: Insufficient documentation

## 2019-04-10 DIAGNOSIS — K219 Gastro-esophageal reflux disease without esophagitis: Secondary | ICD-10-CM | POA: Insufficient documentation

## 2019-04-10 DIAGNOSIS — Z0000 Encounter for general adult medical examination without abnormal findings: Secondary | ICD-10-CM | POA: Insufficient documentation

## 2019-04-10 DIAGNOSIS — L940 Localized scleroderma [morphea]: Secondary | ICD-10-CM | POA: Insufficient documentation

## 2019-04-10 DIAGNOSIS — I10 Essential (primary) hypertension: Secondary | ICD-10-CM

## 2019-04-10 LAB — POC URINALSYSI DIPSTICK (AUTOMATED)
Bilirubin, UA: NEGATIVE
Blood, UA: NEGATIVE
Glucose, UA: NEGATIVE
Ketones, UA: NEGATIVE
Nitrite, UA: NEGATIVE
Protein, UA: NEGATIVE
Spec Grav, UA: 1.01 (ref 1.010–1.025)
Urobilinogen, UA: 0.2 E.U./dL
pH, UA: 6 (ref 5.0–8.0)

## 2019-04-10 LAB — POCT GLYCOSYLATED HEMOGLOBIN (HGB A1C): Hemoglobin A1C: 5.7 % — AB (ref 4.0–5.6)

## 2019-04-10 NOTE — Addendum Note (Signed)
Addended by: Jacqualin Combes on: 04/10/2019 01:02 PM   Modules accepted: Orders

## 2019-04-10 NOTE — Patient Instructions (Addendum)
Start exercising. You should be getting 150 minutes of moderate intensity exercise weekly.  Continue to work on a healthy diet.  Stop by the lab prior to leaving today. I will notify you of your results once received.   It was a pleasure to see you today!   Preventive Care 79-58 Years Old, Female Preventive care refers to visits with your health care provider and lifestyle choices that can promote health and wellness. This includes:  A yearly physical exam. This may also be called an annual well check.  Regular dental visits and eye exams.  Immunizations.  Screening for certain conditions.  Healthy lifestyle choices, such as eating a healthy diet, getting regular exercise, not using drugs or products that contain nicotine and tobacco, and limiting alcohol use. What can I expect for my preventive care visit? Physical exam Your health care provider will check your:  Height and weight. This may be used to calculate body mass index (BMI), which tells if you are at a healthy weight.  Heart rate and blood pressure.  Skin for abnormal spots. Counseling Your health care provider may ask you questions about your:  Alcohol, tobacco, and drug use.  Emotional well-being.  Home and relationship well-being.  Sexual activity.  Eating habits.  Work and work Statistician.  Method of birth control.  Menstrual cycle.  Pregnancy history. What immunizations do I need?  Influenza (flu) vaccine  This is recommended every year. Tetanus, diphtheria, and pertussis (Tdap) vaccine  You may need a Td booster every 10 years. Varicella (chickenpox) vaccine  You may need this if you have not been vaccinated. Zoster (shingles) vaccine  You may need this after age 30. Measles, mumps, and rubella (MMR) vaccine  You may need at least one dose of MMR if you were born in 1957 or later. You may also need a second dose. Pneumococcal conjugate (PCV13) vaccine  You may need this if you have  certain conditions and were not previously vaccinated. Pneumococcal polysaccharide (PPSV23) vaccine  You may need one or two doses if you smoke cigarettes or if you have certain conditions. Meningococcal conjugate (MenACWY) vaccine  You may need this if you have certain conditions. Hepatitis A vaccine  You may need this if you have certain conditions or if you travel or work in places where you may be exposed to hepatitis A. Hepatitis B vaccine  You may need this if you have certain conditions or if you travel or work in places where you may be exposed to hepatitis B. Haemophilus influenzae type b (Hib) vaccine  You may need this if you have certain conditions. Human papillomavirus (HPV) vaccine  If recommended by your health care provider, you may need three doses over 6 months. You may receive vaccines as individual doses or as more than one vaccine together in one shot (combination vaccines). Talk with your health care provider about the risks and benefits of combination vaccines. What tests do I need? Blood tests  Lipid and cholesterol levels. These may be checked every 5 years, or more frequently if you are over 34 years old.  Hepatitis C test.  Hepatitis B test. Screening  Lung cancer screening. You may have this screening every year starting at age 54 if you have a 30-pack-year history of smoking and currently smoke or have quit within the past 15 years.  Colorectal cancer screening. All adults should have this screening starting at age 12 and continuing until age 29. Your health care provider may recommend screening  at age 36 if you are at increased risk. You will have tests every 1-10 years, depending on your results and the type of screening test.  Diabetes screening. This is done by checking your blood sugar (glucose) after you have not eaten for a while (fasting). You may have this done every 1-3 years.  Mammogram. This may be done every 1-2 years. Talk with your  health care provider about when you should start having regular mammograms. This may depend on whether you have a family history of breast cancer.  BRCA-related cancer screening. This may be done if you have a family history of breast, ovarian, tubal, or peritoneal cancers.  Pelvic exam and Pap test. This may be done every 3 years starting at age 72. Starting at age 15, this may be done every 5 years if you have a Pap test in combination with an HPV test. Other tests  Sexually transmitted disease (STD) testing.  Bone density scan. This is done to screen for osteoporosis. You may have this scan if you are at high risk for osteoporosis. Follow these instructions at home: Eating and drinking  Eat a diet that includes fresh fruits and vegetables, whole grains, lean protein, and low-fat dairy.  Take vitamin and mineral supplements as recommended by your health care provider.  Do not drink alcohol if: ? Your health care provider tells you not to drink. ? You are pregnant, may be pregnant, or are planning to become pregnant.  If you drink alcohol: ? Limit how much you have to 0-1 drink a day. ? Be aware of how much alcohol is in your drink. In the [place] one drink equals one 12 oz bottle of beer (355 mL), one 5 oz glass of wine (148 mL), or one 1 oz glass of hard liquor (44 mL). Lifestyle  Take daily care of your teeth and gums.  Stay active. Exercise for at least 30 minutes on 5 or more days each week.  Do not use any products that contain nicotine or tobacco, such as cigarettes, e-cigarettes, and chewing tobacco. If you need help quitting, ask your health care provider.  If you are sexually active, practice safe sex. Use a condom or other form of birth control (contraception) in order to prevent pregnancy and STIs (sexually transmitted infections).  If told by your health care provider, take low-dose aspirin daily starting at age 39. What's next?  Visit your health care provider once  a year for a well check visit.  Ask your health care provider how often you should have your eyes and teeth checked.  Stay up to date on all vaccines. This information is not intended to replace advice given to you by your health care provider. Make sure you discuss any questions you have with your health care provider. Document Released: 07/31/2015 Document Revised: 03/15/2018 Document Reviewed: 03/15/2018 Elsevier Patient Education  2020 [employer].

## 2019-04-10 NOTE — Assessment & Plan Note (Signed)
Symptoms for two weeks, no improvement with OTC treatment.   UA today with 1+ leuks, negative nitrites and blood. Culture sent.  Await results.

## 2019-04-10 NOTE — Assessment & Plan Note (Signed)
Immunizations UTD. Flu shot UTD. Pap smear UTD. Mammogram due in Fall 2020. Colonoscopy UTD, due in 2023. Encouraged regular exercise, healthy diet. Exam today stable. Labs reviewed.

## 2019-04-10 NOTE — Assessment & Plan Note (Signed)
Overall stable, following with ophthalmology annually from prior hydroxychloroquine use.

## 2019-04-10 NOTE — Progress Notes (Signed)
Subjective:    Patient ID: Raven Romero, female    DOB: April 18, 1961, 58 y.o.   MRN: [id number]  HPI  Raven Romero is a 58 year old female with a history of hypertension with retinopathy, dyslipidemia, hyperglycemia, and cervical spondylosis, who presents today for complete physical.  Immunizations: -Tetanus: Completed in 2013. -Influenza: Completed today  Diet: She endorses a healthy diet. She recently started [employer] program.  Exercise: She is walking sometimes.   Eye exam: Completed in Fall 2019 Dental exam: Completes semi-annually  Colonoscopy: Completed in 2018, due in 2023 Pap Smear: Completed in 2020 Mammogram: Completed in 2019 Hep C Screen: Negative in 2020  BP Readings from Last 3 Encounters:  04/10/19 114/72  01/04/19 110/72  07/31/18 122/68     Review of Systems  Constitutional: Negative for fever and unexpected weight change.  HENT: Negative for rhinorrhea.   Respiratory: Negative for cough and shortness of breath.   Cardiovascular: Negative for chest pain.  Gastrointestinal: Negative for constipation and diarrhea.  Genitourinary: Positive for dysuria and frequency. Negative for difficulty urinating, hematuria and pelvic pain.       Dysuria, urinary frequency present for the last two weeks. She's been taking AZO OTC without improvement.   Musculoskeletal: Negative for arthralgias and myalgias.  Skin: Negative for rash.  Allergic/Immunologic: Negative for environmental allergies.  Neurological: Negative for dizziness, numbness and headaches.  Psychiatric/Behavioral: The patient is not nervous/anxious.        Past Medical History:  Diagnosis Date  . Breast mass, right   . Cervical polyp 04/09/15   2 small polyps on ultrasound  . Hypertension   . Kidney stones   . Lichen sclerosus   . Long-term use of Plaquenil 06/06/2017  . Morphea   . Shingles      Social History   Socioeconomic History  . Marital status: Married    Spouse name: Not on file  .  Number of children: Not on file  . Years of education: Not on file  . Highest education level: Not on file  Occupational History  . Not on file  Social Needs  . Financial resource strain: Not on file  . Food insecurity    Worry: Not on file    Inability: Not on file  . Transportation needs    Medical: Not on file    Non-medical: Not on file  Tobacco Use  . Smoking status: Never Smoker  . Smokeless tobacco: Never Used  Substance and Sexual Activity  . Alcohol use: Yes    Alcohol/week: 6.0 standard drinks    Types: 6 Standard drinks or equivalent per week  . Drug use: No  . Sexual activity: Yes    Partners: Male    Birth control/protection: Surgical    Comment: btsp  Lifestyle  . Physical activity    Days per week: Not on file    Minutes per session: Not on file  . Stress: Not on file  Relationships  . Social Herbalist on phone: Not on file    Gets together: Not on file    Attends religious service: Not on file    Active member of club or organization: Not on file    Attends meetings of clubs or organizations: Not on file    Relationship status: Not on file  . Intimate partner violence    Fear of current or ex partner: Not on file    Emotionally abused: Not on file    Physically  abused: Not on file    Forced sexual activity: Not on file  Other Topics Concern  . Not on file  Social History Narrative   Married.    1 child, 2 grandchildren.   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   Enjoys traveling to the beach.     Past Surgical History:  Procedure Laterality Date  . BREAST EXCISIONAL BIOPSY Right 06/2015  . BREAST LUMPECTOMY WITH RADIOACTIVE SEED LOCALIZATION Right 06/25/2015   Procedure: RIGHT BREAST SEED LOCALIZATION LUMPECTOMY ;  Surgeon: Harriette Bouillon, MD;  Location: [facility];  Service: General;  Laterality: Right;  . CYSTECTOMY  1999   ovarian cystectomy  . NASAL SINUS SURGERY    . TUBAL LIGATION     BTSP    Family History  Problem Relation  Age of Onset  . Hypertension Mother   . Breast cancer Mother   . Heart disease Mother   . Diabetes Paternal Grandmother   . Diabetes Paternal Grandfather   . Diabetes Father   . Heart disease Father     Allergies  Allergen Reactions  . Macrobid  [Nitrofurantoin] Nausea Only  . Other     Medrol Dosepak - pt stated, "Made face feel like it was burning from the inside out; face was blood red"    Current Outpatient Medications on File Prior to Visit  Medication Sig Dispense Refill  . hydrochlorothiazide (HYDRODIURIL) 25 MG tablet daily.     Marland Kitchen olmesartan (BENICAR) 40 MG tablet Take 40 mg by mouth daily.    Marland Kitchen omeprazole (PRILOSEC) 40 MG capsule Take 40 mg by mouth daily.   1   No current facility-administered medications on file prior to visit.     BP 114/72   Pulse 76   Temp 97.8 F (36.6 C) (Temporal)   Ht 5' 2.25" (1.581 m)   Wt 191 lb 8 oz (86.9 kg)   LMP 08/18/2013   SpO2 98%   BMI 34.75 kg/m    Objective:   Physical Exam  Constitutional: She is oriented to person, place, and time. She appears well-nourished.  HENT:  Right Ear: Tympanic membrane and ear canal normal.  Left Ear: Tympanic membrane and ear canal normal.  Mouth/Throat: Oropharynx is clear and moist.  Eyes: Pupils are equal, round, and reactive to light. EOM are normal.  Neck: Neck supple.  Cardiovascular: Normal rate and regular rhythm.  Respiratory: Effort normal and breath sounds normal.  GI: Soft. Bowel sounds are normal. There is no abdominal tenderness.  Musculoskeletal: Normal range of motion.  Neurological: She is alert and oriented to person, place, and time.  Skin: Skin is warm and dry.  Psychiatric: She has a normal mood and affect.           Assessment & Plan:

## 2019-04-10 NOTE — Assessment & Plan Note (Signed)
Following with [facility] annually.

## 2019-04-10 NOTE — Assessment & Plan Note (Signed)
BP stable in the office today, continue olmesartan and HCTZ. BMP reviewed.

## 2019-04-10 NOTE — Assessment & Plan Note (Signed)
Overall stable in the office today, continue to monitor.

## 2019-04-10 NOTE — Assessment & Plan Note (Signed)
Doing well on omeprazole 40 mg daily, continue same.  

## 2019-04-10 NOTE — Assessment & Plan Note (Signed)
Fasting glucose reading of 113 on recent labs. A1C pending today.

## 2019-04-12 ENCOUNTER — Other Ambulatory Visit: Payer: Self-pay | Admitting: Primary Care

## 2019-04-12 DIAGNOSIS — N3000 Acute cystitis without hematuria: Secondary | ICD-10-CM

## 2019-04-12 LAB — URINE CULTURE
MICRO NUMBER:: 913798
SPECIMEN QUALITY:: ADEQUATE

## 2019-04-12 MED ORDER — SULFAMETHOXAZOLE-TRIMETHOPRIM 800-160 MG PO TABS: 1.0000 | ORAL_TABLET | Freq: Two times a day (BID) | ORAL | 0 refills | Status: DC

## 2019-04-30 ENCOUNTER — Other Ambulatory Visit: Payer: Self-pay | Admitting: Obstetrics and Gynecology

## 2019-04-30 DIAGNOSIS — K5904 Chronic idiopathic constipation: Secondary | ICD-10-CM | POA: Diagnosis not present

## 2019-04-30 DIAGNOSIS — E669 Obesity, unspecified: Secondary | ICD-10-CM | POA: Diagnosis not present

## 2019-04-30 DIAGNOSIS — Z1231 Encounter for screening mammogram for malignant neoplasm of breast: Secondary | ICD-10-CM

## 2019-04-30 DIAGNOSIS — K219 Gastro-esophageal reflux disease without esophagitis: Secondary | ICD-10-CM | POA: Diagnosis not present

## 2019-05-09 ENCOUNTER — Ambulatory Visit (INDEPENDENT_AMBULATORY_CARE_PROVIDER_SITE_OTHER): Payer: BC Managed Care – PPO | Admitting: Obstetrics and Gynecology

## 2019-05-09 ENCOUNTER — Encounter: Payer: Self-pay | Admitting: Obstetrics and Gynecology

## 2019-05-09 ENCOUNTER — Telehealth: Payer: Self-pay | Admitting: Primary Care

## 2019-05-09 ENCOUNTER — Other Ambulatory Visit: Payer: Self-pay

## 2019-05-09 VITALS — BP 120/72 | HR 100 | Temp 97.4°F | Ht 62.25 in | Wt 189.4 lb

## 2019-05-09 DIAGNOSIS — N760 Acute vaginitis: Secondary | ICD-10-CM | POA: Diagnosis not present

## 2019-05-09 DIAGNOSIS — R350 Frequency of micturition: Secondary | ICD-10-CM

## 2019-05-09 LAB — POCT URINALYSIS DIPSTICK
Bilirubin, UA: NEGATIVE
Blood, UA: NEGATIVE
Glucose, UA: NEGATIVE
Ketones, UA: NEGATIVE
Leukocytes, UA: NEGATIVE
Nitrite, UA: NEGATIVE
Protein, UA: NEGATIVE
Urobilinogen, UA: 0.2 E.U./dL
pH, UA: 5 (ref 5.0–8.0)

## 2019-05-09 MED ORDER — FLUCONAZOLE 150 MG PO TABS: 150.0000 mg | ORAL_TABLET | Freq: Once | ORAL | 0 refills | Status: AC

## 2019-05-09 NOTE — Telephone Encounter (Signed)
Pt is requesting a call back from Gentry Fitz, NP CMA regarding a recurrent UTI - having worsening sx's again and feels like she is getting another UTI. Requesting an antibiotic be sent to her pharmacy.   [id number] - [address], [place] [address]  Allergies  Allergen Reactions  . Macrobid  [Nitrofurantoin] Nausea Only  . Other     Medrol Dosepak - pt stated, "Made face feel like it was burning from the inside out; face was blood red"   Please advise, thanks.

## 2019-05-09 NOTE — Progress Notes (Signed)
GYNECOLOGY  VISIT   HPI: 58 y.o.   Married  Caucasian  female   G1P1001 with Patient's last menstrual period was 08/18/2013.here for possible UTI. Patient complaining of urinary frequency. She was treated for E.Coli 04-10-19 with Bactrim DS by PCP.  Patient complaining of vaginal burning and itching. Denies vaginal discharge.  Some odor.   Hx renal stones.   No new partners.   A1C I 5.7.   She saw her gastroenterologist recently for constipation.  Taking Trulance for constipation.   Urine Dip:Neg  GYNECOLOGIC HISTORY: Patient's last menstrual period was 08/18/2013. Contraception: Tubal Menopausal hormone therapy:  none Last mammogram: 05-28-18 3D Neg/density B/BiRads1 Last pap smear: 01-04-19 Neg:Neg HR HPV, 11-14-17 Neg        OB History    Gravida  1   Para  1   Term  1   Preterm      AB      Living  1     SAB      TAB      Ectopic      Multiple      Live Births  1              Patient Active Problem List   Diagnosis Date Noted  . Preventative health care 04/10/2019  . Morphea 04/10/2019  . GERD (gastroesophageal reflux disease) 04/10/2019  . Dysuria 04/10/2019  . Hyperglycemia 05/03/2018  . Lichen sclerosus 12/12/2017  . Age-related nuclear cataract of both eyes 06/06/2017  . Hypertensive retinopathy of both eyes 06/06/2017  . Cervical spondylosis 01/16/2017  . Dyslipidemia (high LDL; low HDL) 12/10/2014  . Essential hypertension 12/10/2014  . Mixed incontinence 10/26/2012    Past Medical History:  Diagnosis Date  . Breast mass, right   . Cervical polyp 04/09/15   2 small polyps on ultrasound  . Hypertension   . Kidney stones   . Lichen sclerosus   . Long-term use of Plaquenil 06/06/2017  . Morphea   . Shingles     Past Surgical History:  Procedure Laterality Date  . BREAST EXCISIONAL BIOPSY Right 06/2015  . BREAST LUMPECTOMY WITH RADIOACTIVE SEED LOCALIZATION Right 06/25/2015   Procedure: RIGHT BREAST SEED LOCALIZATION  LUMPECTOMY ;  Surgeon: Harriette Bouillon, MD;  Location: [facility];  Service: General;  Laterality: Right;  . CYSTECTOMY  1999   ovarian cystectomy  . NASAL SINUS SURGERY    . TUBAL LIGATION     BTSP    Current Outpatient Medications  Medication Sig Dispense Refill  . Cholecalciferol (VITAMIN D3) POWD Take 1 capsule by mouth daily.    . hydrochlorothiazide (HYDRODIURIL) 25 MG tablet daily.     Marland Kitchen olmesartan (BENICAR) 40 MG tablet Take 40 mg by mouth daily.    Marland Kitchen omeprazole (PRILOSEC) 40 MG capsule Take 40 mg by mouth daily.   1  . TRULANCE 3 MG TABS Take 1 tablet by mouth daily.     No current facility-administered medications for this visit.      ALLERGIES: Macrobid  [nitrofurantoin] and Other  Family History  Problem Relation Age of Onset  . Hypertension Mother   . Breast cancer Mother   . Heart disease Mother   . Diabetes Paternal Grandmother   . Diabetes Paternal Grandfather   . Diabetes Father   . Heart disease Father     Social History   Socioeconomic History  . Marital status: Married    Spouse name: Not on file  . Number of children:  Not on file  . Years of education: Not on file  . Highest education level: Not on file  Occupational History  . Not on file  Social Needs  . Financial resource strain: Not on file  . Food insecurity    Worry: Not on file    Inability: Not on file  . Transportation needs    Medical: Not on file    Non-medical: Not on file  Tobacco Use  . Smoking status: Never Smoker  . Smokeless tobacco: Never Used  Substance and Sexual Activity  . Alcohol use: Yes    Alcohol/week: 6.0 standard drinks    Types: 6 Standard drinks or equivalent per week  . Drug use: No  . Sexual activity: Yes    Partners: Male    Birth control/protection: Surgical    Comment: btsp  Lifestyle  . Physical activity    Days per week: Not on file    Minutes per session: Not on file  . Stress: Not on file  Relationships  . Social Product manager on phone: Not on file    Gets together: Not on file    Attends religious service: Not on file    Active member of club or organization: Not on file    Attends meetings of clubs or organizations: Not on file    Relationship status: Not on file  . Intimate partner violence    Fear of current or ex partner: Not on file    Emotionally abused: Not on file    Physically abused: Not on file    Forced sexual activity: Not on file  Other Topics Concern  . Not on file  Social History Narrative   Married.    1 child, 2 grandchildren.   Works in Science writer.   Enjoys traveling to the beach.     Review of Systems  Genitourinary: Positive for frequency.  All other systems reviewed and are negative.   PHYSICAL EXAMINATION:    BP 120/72 (Cuff Size: Large)   Pulse 100   Temp (!) 97.4 F (36.3 C) (Temporal)   Ht 5' 2.25" (1.581 m)   Wt 189 lb 6.4 oz (85.9 kg)   LMP 08/18/2013   BMI 34.36 kg/m     General appearance: alert, cooperative and appears stated age  Pelvic: External genitalia:  no lesions              Urethra:  normal appearing urethra with no masses, tenderness or lesions              Bartholins and Skenes: normal                 Vagina: normal appearing vagina with normal color and discharge, no lesions              Cervix: no lesions                Bimanual Exam:  Uterus:  normal size, contour, position, consistency, mobility, non-tender              Adnexa: no mass, fullness, tenderness        Chaperone was present for exam.  ASSESSMENT  Urinary frequency.  Recent UTI.  Negative urine dip.  Vaginitis.    PLAN  Affirm.  Diflucan Rx. Urine culture per request.   An After Visit Summary was printed and given to the patient.  ___15___ minutes face to face time of which over 50% was spent in counseling.

## 2019-05-09 NOTE — Telephone Encounter (Signed)
Spoken and notified patient of Raven Romero comments. Patient stated that she cannot wait until Friday. She has schedule to see her GYN today at 4pm.

## 2019-05-09 NOTE — Telephone Encounter (Signed)
Noted  

## 2019-05-09 NOTE — Telephone Encounter (Signed)
Patient needs re-evaluation, please schedule.

## 2019-05-10 ENCOUNTER — Other Ambulatory Visit: Payer: Self-pay | Admitting: *Deleted

## 2019-05-10 LAB — VAGINITIS/VAGINOSIS, DNA PROBE
Candida Species: NEGATIVE
Gardnerella vaginalis: POSITIVE — AB
Trichomonas vaginosis: NEGATIVE

## 2019-05-10 MED ORDER — METRONIDAZOLE 500 MG PO TABS: 500.0000 mg | ORAL_TABLET | Freq: Two times a day (BID) | ORAL | 0 refills | Status: DC

## 2019-05-11 LAB — URINE CULTURE

## 2019-05-13 ENCOUNTER — Other Ambulatory Visit: Payer: Self-pay | Admitting: *Deleted

## 2019-05-13 MED ORDER — AMOXICILLIN-POT CLAVULANATE 500-125 MG PO TABS: 1.0000 | ORAL_TABLET | Freq: Two times a day (BID) | ORAL | 0 refills | Status: AC

## 2019-05-16 DIAGNOSIS — R1032 Left lower quadrant pain: Secondary | ICD-10-CM | POA: Diagnosis not present

## 2019-05-16 DIAGNOSIS — R3982 Chronic bladder pain: Secondary | ICD-10-CM | POA: Diagnosis not present

## 2019-05-16 DIAGNOSIS — N200 Calculus of kidney: Secondary | ICD-10-CM | POA: Diagnosis not present

## 2019-05-16 DIAGNOSIS — N3020 Other chronic cystitis without hematuria: Secondary | ICD-10-CM | POA: Diagnosis not present

## 2019-06-04 ENCOUNTER — Other Ambulatory Visit: Payer: Self-pay | Admitting: Urology

## 2019-06-05 ENCOUNTER — Other Ambulatory Visit: Payer: Self-pay | Admitting: Urology

## 2019-06-10 ENCOUNTER — Encounter (HOSPITAL_COMMUNITY): Payer: Self-pay | Admitting: *Deleted

## 2019-06-12 NOTE — H&P (Signed)
--------------------------------------------------------------------------------   Raven Romero  MRN: [id number]  DOB: 1961-02-26, 58 year old Female  SSN: -[id number]   PRIMARY CARE:  Vernona Rieger, NP  REFERRING:    PROVIDER:  Rhoderick Moody, M.D.  LOCATION:  [facility] [phone]     --------------------------------------------------------------------------------   CC/HPI: Left flank pain and bladder discomfort   [name] is a 58 year old female with a history of kidney stones.   05/17/19: The patient reports a one week history of dull, intermittent, non-radiating left lower back pain associated with bladder pressure and dysuria. She was seen by her PCP last week and was started on a course of amoxicillin and flagyl for a suspected UTI but has seen little change in her sxs. She denies N/V/F/C and is AFVSS today. UA is clear. No recent imaging.     ALLERGIES: No Allergies    MEDICATIONS: Hydrochlorothiazide 25 mg tablet  Omeprazole 40 mg capsule,delayed release  Amoxicillin 500 mg capsule  Metronidazole 500 mg tablet  Olmesartan Medoxomil 40 mg tablet  Tramadol Hcl 50 mg tablet 1 tablet PO Q 6 H     GU PSH: ESWL     NON-GU PSH: Breast Biopsy - about 2016 Sinus Surgery..     GU PMH: Low back pain - 2017 Chronic cystitis (w/o hematuria), Chronic cystitis - 2017 Renal calculus, Bilateral kidney stones - 2017, Kidney stone on left side, - 2016 Dysuria, Dysuria - 2016 Urinary Urgency, Urinary urgency - 2016 Gross hematuria, Gross hematuria - 2016 Other microscopic hematuria, Microscopic hematuria - 2016 Oth GU systems Signs/Symptoms, Bladder pain - 2016 Cystocele, midline, Cystocele, midline - 2014 History of urolithiasis, Nephrolithiasis - 2014 Interstitial Cystitis (w/o hematuria), Chronic interstitial cystitis without hematuria - 2014 Mixed incontinence, Urge and stress incontinence - 2014 Stress Incontinence, Female stress  incontinence - 2014 Ureteral calculus, Ureteral Stone - 2014    NON-GU PMH: Bacteriuria - 2017 Encounter for general adult medical examination without abnormal findings, Encounter for preventive health examination - 2016 Muscle weakness (generalized), Muscle weakness - 2014 Other lack of coordination, Other lack of coordination - 2014 Other muscle spasm, Muscle spasm - 2014 Personal history of other diseases of the circulatory system, History of hypertension - 2014    FAMILY HISTORY: Diabetes - Runs In Family Family Health Status Number - Runs In Family nephrolithiasis - Father   SOCIAL HISTORY: Marital Status: Married Preferred Language: English; Ethnicity: Not Hispanic Or Latino; Race: White Current Smoking Status: Patient has never smoked.  Moderate Drinker.  Drinks 1 caffeinated drink per day.     Notes: Activities Of Daily Living, Self-reliant In Usual Daily Activities, Living Independently With Spouse, Exercise Habits, Never A Smoker, Alcohol Use, Caffeine Use, Occupation:, Tobacco Use, Marital History - Currently Married   REVIEW OF SYSTEMS:    GU Review Female:   Patient reports frequent urination, burning /pain with urination, get up at night to urinate, and leakage of urine. Patient denies hard to postpone urination, stream starts and stops, trouble starting your stream, have to strain to urinate, and being pregnant.  Gastrointestinal (Upper):   Patient denies nausea, vomiting, and indigestion/ heartburn.  Gastrointestinal (Lower):   Patient reports constipation. Patient denies diarrhea.  Constitutional:   Patient denies fever, night sweats, weight loss, and fatigue.  Skin:   Patient reports itching. Patient denies skin rash/ lesion.  Eyes:   Patient denies blurred vision and double vision.  Ears/ Nose/ Throat:   Patient denies sore throat and sinus  problems.  Hematologic/Lymphatic:   Patient denies swollen glands and easy bruising.  Cardiovascular:   Patient denies leg  swelling and chest pains.  Respiratory:   Patient denies cough and shortness of breath.  Endocrine:   Patient denies excessive thirst.  Musculoskeletal:   Patient reports back pain. Patient denies joint pain.  Neurological:   Patient denies headaches and dizziness.  Psychologic:   Patient denies depression and anxiety.   Notes: history of UTI and kidney stones , vaginal itching , L side flank pain     VITAL SIGNS:      05/16/2019 03:13 PM  Weight 180 lb / 81.65 kg  Height 63 in / 160.02 cm  BP 98/73 mmHg  Heart Rate 98 /min  Temperature 98.2 F / 36.7 C  BMI 31.9 kg/m   MULTI-SYSTEM PHYSICAL EXAMINATION:    Constitutional: Well-nourished. No physical deformities. Normally developed. Good grooming.  Neck: Neck symmetrical, not swollen. Normal tracheal position.  Respiratory: No labored breathing, no use of accessory muscles.   Cardiovascular: Normal temperature, normal extremity pulses, no swelling, no varicosities.  Skin: No paleness, no jaundice, no cyanosis. No lesion, no ulcer, no rash.  Neurologic / Psychiatric: Oriented to time, oriented to place, oriented to person. No depression, no anxiety, no agitation.  Gastrointestinal: No mass, no tenderness, no rigidity, non obese abdomen.  Eyes: Normal conjunctivae. Normal eyelids.  Musculoskeletal: Normal gait and station of head and neck.     PAST DATA REVIEWED:  Source Of History:  Patient   PROCEDURES:         C.T. Urogram - [id number]      Patient confirmed No Neulasta OnPro Device.           Urinalysis w/Scope Dipstick Dipstick Cont'd Micro  Color: Yellow Bilirubin: Neg mg/dL WBC/hpf: 0 - 5/hpf  Appearance: Clear Ketones: Neg mg/dL RBC/hpf: 0 - 2/hpf  Specific Gravity: 1.020 Blood: Neg ery/uL Bacteria: NS (Not Seen)  pH: 5.5 Protein: Neg mg/dL Cystals: NS (Not Seen)  Glucose: Neg mg/dL Urobilinogen: 0.2 mg/dL Casts: NS (Not Seen)    Nitrites: Neg Trichomonas: Not Present    Leukocyte Esterase: Trace leu/uL Mucous: Not  Present      Epithelial Cells: 0 - 5/hpf      Yeast: NS (Not Seen)      Sperm: Not Present    ASSESSMENT:      ICD-10 Details  1 GU:   Suprapubic pain - R39.82   2   LLQ pain - R10.32   3   Chronic cystitis (w/o hematuria) - N30.20   4   Renal calculus - N20.0 Stable - 6 mm left lower pole renal stone (visible on scout)   PLAN:            Medications New Meds: Cipro 500 mg tablet 1 tablet PO BID   #10  0 Refill(s)    Stop Meds: Tramadol Hcl 50 mg tablet 1 tablet PO Q 6 H  Start: 01/07/2016  Discontinue: 05/16/2019  - Reason: The medication cycle was completed.            Orders X-Rays: [facility] Protocol Without Contrast          Schedule Return Visit/Planned Activity: Return PRN          Document Letter(s):  Created for Patient: Clinical Summary         Notes:   -CTSS shows a 6 mm left lower pole stone (visible on scout) with no signs of obstructive uropathy  -  Empiric course of cipro provided  - The risks, benefits and alternatives of LEFT ESWL was discussed with the patient. I described the risks which include arrhythmia, kidney contusion, kidney hemorrhage, need for transfusion, back discomfort, flank ecchymosis, flank abrasion, inability to break up stone, inability to pass stone fragments, Steinstrasse, infection associated with obstructing stones, need for different surgical procedure and possible need for repeat shockwave lithotripsy. The patient voices understanding and wishes to proceed.     * Signed by Ellison Hughs, M.D. on 05/17/19 at 1:17 PM (EDT)*

## 2019-06-17 ENCOUNTER — Ambulatory Visit: Payer: BC Managed Care – PPO

## 2019-06-17 ENCOUNTER — Encounter (HOSPITAL_COMMUNITY): Payer: Self-pay | Admitting: General Practice

## 2019-06-17 ENCOUNTER — Ambulatory Visit (HOSPITAL_COMMUNITY): Payer: BC Managed Care – PPO

## 2019-06-17 ENCOUNTER — Ambulatory Visit (HOSPITAL_COMMUNITY)
Admission: RE | Admit: 2019-06-17 | Discharge: 2019-06-17 | Disposition: A | Discharge status: Home / Self Care | Payer: BC Managed Care – PPO | Source: Ambulatory Visit | Attending: Urology | Admitting: Urology

## 2019-06-17 ENCOUNTER — Encounter (HOSPITAL_COMMUNITY)
Admission: RE | Disposition: A | Discharge status: Home / Self Care | Payer: Self-pay | Source: Ambulatory Visit | Attending: Urology

## 2019-06-17 DIAGNOSIS — Z79899 Other long term (current) drug therapy: Secondary | ICD-10-CM | POA: Insufficient documentation

## 2019-06-17 DIAGNOSIS — N3020 Other chronic cystitis without hematuria: Secondary | ICD-10-CM | POA: Diagnosis not present

## 2019-06-17 DIAGNOSIS — R1032 Left lower quadrant pain: Secondary | ICD-10-CM | POA: Insufficient documentation

## 2019-06-17 DIAGNOSIS — N200 Calculus of kidney: Secondary | ICD-10-CM | POA: Diagnosis not present

## 2019-06-17 HISTORY — PX: EXTRACORPOREAL SHOCK WAVE LITHOTRIPSY: SHX1557

## 2019-06-17 SURGERY — LITHOTRIPSY, ESWL
Anesthesia: LOCAL | Laterality: Left

## 2019-06-17 MED ORDER — CIPROFLOXACIN HCL 500 MG PO TABS
500.0000 mg | ORAL_TABLET | ORAL | Status: AC
  Administered 2019-06-17: 500 mg via ORAL
  Filled 2019-06-17: qty 1

## 2019-06-17 MED ORDER — HYDROCODONE-ACETAMINOPHEN 5-325 MG PO TABS: 1.0000 | ORAL_TABLET | Freq: Four times a day (QID) | ORAL | 0 refills | Status: DC | PRN

## 2019-06-17 MED ORDER — SODIUM CHLORIDE 0.9 % IV SOLN
INTRAVENOUS | Status: DC
  Administered 2019-06-17: 07:00:00 via INTRAVENOUS

## 2019-06-17 MED ORDER — DIPHENHYDRAMINE HCL 25 MG PO CAPS
25.0000 mg | ORAL_CAPSULE | ORAL | Status: AC
  Administered 2019-06-17: 07:00:00 25 mg via ORAL
  Filled 2019-06-17: qty 1

## 2019-06-17 MED ORDER — DIAZEPAM 5 MG PO TABS
10.0000 mg | ORAL_TABLET | ORAL | Status: AC
  Administered 2019-06-17: 10 mg via ORAL
  Filled 2019-06-17: qty 2

## 2019-06-17 MED ORDER — TAMSULOSIN HCL 0.4 MG PO CAPS: 0.4000 mg | ORAL_CAPSULE | Freq: Every day | ORAL | 0 refills | Status: DC

## 2019-06-17 NOTE — Discharge Instructions (Signed)
1. You should strain your urine and collect all fragments and bring them to your follow up appointment.  °2. You should take your pain medication as needed.  Please call if your pain is severe to the point that it is not controlled with your pain medication. °3. You should call if you develop fever > 101 or persistent nausea or vomiting. °4. Your doctor may prescribe tamsulosin to take to help facilitate stone passage. °

## 2019-06-17 NOTE — Interval H&P Note (Signed)
History and Physical Interval Note:  06/17/2019 6:58 AM  Raven Romero  has presented today for surgery, with the diagnosis of LEFT RENAL STONES.  The various methods of treatment have been discussed with the patient and family. After consideration of risks, benefits and other options for treatment, the patient has consented to  Procedure(s): EXTRACORPOREAL SHOCK WAVE LITHOTRIPSY (ESWL) (Left) as a surgical intervention.  The patient's history has been reviewed, patient examined, no change in status, stable for surgery.  I have reviewed the patient's chart and labs.  Questions were answered to the patient's satisfaction.     [employer]

## 2019-06-17 NOTE — Op Note (Signed)
See [facility] operative note scanned into chart. Also because of the size, density, location and other factors that cannot be anticipated I feel this will likely be a staged procedure. This fact supersedes any indication in the scanned [facility] operative note to the contrary.  

## 2019-06-18 ENCOUNTER — Encounter (HOSPITAL_COMMUNITY): Payer: Self-pay | Admitting: Urology

## 2019-07-08 ENCOUNTER — Ambulatory Visit
Admission: RE | Admit: 2019-07-08 | Discharge: 2019-07-08 | Disposition: A | Discharge status: Home / Self Care | Payer: BC Managed Care – PPO | Source: Ambulatory Visit | Attending: Obstetrics and Gynecology | Admitting: Obstetrics and Gynecology

## 2019-07-08 ENCOUNTER — Other Ambulatory Visit: Payer: Self-pay

## 2019-07-08 DIAGNOSIS — Z1231 Encounter for screening mammogram for malignant neoplasm of breast: Secondary | ICD-10-CM | POA: Diagnosis not present

## 2019-07-10 DIAGNOSIS — N200 Calculus of kidney: Secondary | ICD-10-CM | POA: Diagnosis not present

## 2019-08-28 DIAGNOSIS — R350 Frequency of micturition: Secondary | ICD-10-CM | POA: Diagnosis not present

## 2019-08-28 DIAGNOSIS — I10 Essential (primary) hypertension: Secondary | ICD-10-CM | POA: Diagnosis not present

## 2019-08-28 DIAGNOSIS — R300 Dysuria: Secondary | ICD-10-CM | POA: Diagnosis not present

## 2019-08-28 DIAGNOSIS — Z0130 Encounter for examination of blood pressure without abnormal findings: Secondary | ICD-10-CM | POA: Diagnosis not present

## 2019-09-19 ENCOUNTER — Other Ambulatory Visit: Payer: Self-pay

## 2019-09-19 ENCOUNTER — Ambulatory Visit (INDEPENDENT_AMBULATORY_CARE_PROVIDER_SITE_OTHER): Payer: BC Managed Care – PPO | Admitting: Podiatry

## 2019-09-19 DIAGNOSIS — M79672 Pain in left foot: Secondary | ICD-10-CM

## 2019-09-19 DIAGNOSIS — M722 Plantar fascial fibromatosis: Secondary | ICD-10-CM

## 2019-09-19 DIAGNOSIS — M79671 Pain in right foot: Secondary | ICD-10-CM

## 2019-09-30 NOTE — Progress Notes (Signed)
Subjective:   Patient ID: Raven Romero, female   DOB: 59 y.o.   MRN: [id number]   HPI 59 year old female presents the office today requesting new orthotics.  She states that she was doing well with the orthotics.  She had this previously because of plantar fasciitis.  Some occasional discomfort to the area but overall controlled.  No recent injury.  No other concerns today.   Review of Systems  All other systems reviewed and are negative.  Past Medical History:  Diagnosis Date  . Breast mass, right   . Cervical polyp 04/09/15   2 small polyps on ultrasound  . Hypertension   . Kidney stones   . Lichen sclerosus   . Long-term use of Plaquenil 06/06/2017  . Morphea   . Shingles     Past Surgical History:  Procedure Laterality Date  . BREAST EXCISIONAL BIOPSY Right 06/2015  . BREAST LUMPECTOMY WITH RADIOACTIVE SEED LOCALIZATION Right 06/25/2015   Procedure: RIGHT BREAST SEED LOCALIZATION LUMPECTOMY ;  Surgeon: Harriette Bouillon, MD;  Location: [facility];  Service: General;  Laterality: Right;  . CYSTECTOMY  1999   ovarian cystectomy  . EXTRACORPOREAL SHOCK WAVE LITHOTRIPSY Left 06/17/2019   Procedure: EXTRACORPOREAL SHOCK WAVE LITHOTRIPSY (ESWL);  Surgeon: Heloise Purpura, MD;  Location: [facility];  Service: Urology;  Laterality: Left;  . NASAL SINUS SURGERY    . TUBAL LIGATION     BTSP     Current Outpatient Medications:  .  Cholecalciferol (VITAMIN D3) POWD, Take 1 capsule by mouth daily., Disp: , Rfl:  .  hydrochlorothiazide (HYDRODIURIL) 25 MG tablet, daily. , Disp: , Rfl:  .  HYDROcodone-acetaminophen (NORCO/VICODIN) 5-325 MG tablet, Take 1-2 tablets by mouth every 6 (six) hours as needed., Disp: 20 tablet, Rfl: 0 .  olmesartan (BENICAR) 40 MG tablet, Take 40 mg by mouth daily., Disp: , Rfl:  .  omeprazole (PRILOSEC) 40 MG capsule, Take 40 mg by mouth daily. , Disp: , Rfl: 1 .  tamsulosin (FLOMAX) 0.4 MG CAPS capsule, Take 1 capsule (0.4 mg total) by mouth at  bedtime., Disp: 14 capsule, Rfl: 0 .  TRULANCE 3 MG TABS, Take 1 tablet by mouth daily., Disp: , Rfl:   Allergies  Allergen Reactions  . Macrobid  [Nitrofurantoin] Nausea Only  . Other     Medrol Dosepak - pt stated, "Made face feel like it was burning from the inside out; face was blood red"          Objective:  Physical Exam  General: AAO x3, NAD  Dermatological: Skin is warm, dry and supple bilateral. Nails x 10 are well manicured; remaining integument appears unremarkable at this time. There are no open sores, no preulcerative lesions, no rash or signs of infection present.  Vascular: Dorsalis Pedis artery and Posterior Tibial artery pedal pulses are 2/4 bilateral with immedate capillary fill time. Pedal hair growth present. No varicosities and no lower extremity edema present bilateral. There is no pain with calf compression, swelling, warmth, erythema.   Neruologic: Grossly intact via light touch bilateral.  Negative Tinel sign.  Musculoskeletal: At this time there is no significant tenderness palpation on the course or insertion of plantar fascia.  No pain with Achilles tendon.  There is no pain with lateral compression of calcaneus.  Muscular strength 5/5 in all groups tested bilateral.  Gait: Unassisted, Nonantalgic.       Assessment:   59 year old female with plantar fasciitis    Plan:  -Treatment options discussed including  all alternatives, risks, and complications -Etiology of symptoms were discussed -Measured for new orthotics today. -Stretching exercises daily. -Discussed supportive shoes  Return in about 4 weeks (around 10/17/2019).  Trula Slade DPM

## 2019-10-07 ENCOUNTER — Encounter: Payer: Self-pay | Admitting: Certified Nurse Midwife

## 2019-10-17 ENCOUNTER — Other Ambulatory Visit: Payer: Self-pay

## 2019-10-17 ENCOUNTER — Ambulatory Visit: Payer: BC Managed Care – PPO | Admitting: Orthotics

## 2019-10-17 DIAGNOSIS — N200 Calculus of kidney: Secondary | ICD-10-CM | POA: Diagnosis not present

## 2019-10-17 DIAGNOSIS — M722 Plantar fascial fibromatosis: Secondary | ICD-10-CM

## 2019-10-17 DIAGNOSIS — M79671 Pain in right foot: Secondary | ICD-10-CM

## 2019-10-17 NOTE — Progress Notes (Signed)
Patient came in today to pick up custom made foot orthotics.  The goals were accomplished and the patient reported no dissatisfaction with said orthotics.  Patient was advised of breakin period and how to report any issues. 

## 2019-10-23 ENCOUNTER — Ambulatory Visit: Payer: BC Managed Care – PPO | Attending: Internal Medicine

## 2019-10-23 DIAGNOSIS — Z23 Encounter for immunization: Secondary | ICD-10-CM

## 2019-10-23 NOTE — Progress Notes (Signed)
   Covid-19 Vaccination Clinic  Name:  BRYNLEY CUDDEBACK    MRN: [id number] DOB: 1961-06-15  10/23/2019  Ms. Brackin was observed post Covid-19 immunization for 15 minutes without incident. She was provided with Vaccine Information Sheet and instruction to access the V-Safe system.   Ms. Demonbreun was instructed to call 911 with any severe reactions post vaccine: Marland Kitchen Difficulty breathing  . Swelling of face and throat  . A fast heartbeat  . A bad rash all over body  . Dizziness and weakness   Immunizations Administered    Name Date Dose VIS Date Route   Moderna COVID-19 Vaccine 10/23/2019  2:39 PM 0.5 mL 06/18/2019 Intramuscular   Manufacturer: Moderna   Lot: [id number]   NDC: 06770-340-35

## 2019-10-24 DIAGNOSIS — N952 Postmenopausal atrophic vaginitis: Secondary | ICD-10-CM | POA: Diagnosis not present

## 2019-10-24 DIAGNOSIS — R300 Dysuria: Secondary | ICD-10-CM | POA: Diagnosis not present

## 2019-11-20 ENCOUNTER — Ambulatory Visit: Payer: BC Managed Care – PPO | Attending: Internal Medicine

## 2019-11-20 DIAGNOSIS — Z23 Encounter for immunization: Secondary | ICD-10-CM

## 2019-11-20 NOTE — Progress Notes (Signed)
   Covid-19 Vaccination Clinic  Name:  Raven Romero    MRN: [id number] DOB: December 22, 1960  11/20/2019  [name] was observed post Covid-19 immunization for 15 minutes without incident. She was provided with Vaccine Information Sheet and instruction to access the V-Safe system.   [name] was instructed to call 911 with any severe reactions post vaccine: Marland Kitchen Difficulty breathing  . Swelling of face and throat  . A fast heartbeat  . A bad rash all over body  . Dizziness and weakness   Immunizations Administered    Name Date Dose VIS Date Route   Moderna COVID-19 Vaccine 11/20/2019 11:32 AM 0.5 mL 06/2019 Intramuscular   Manufacturer: Moderna   Lot: [id number]   NDC: 03979-536-92

## 2019-12-26 ENCOUNTER — Telehealth: Payer: Self-pay | Admitting: *Deleted

## 2019-12-26 NOTE — Telephone Encounter (Signed)
Called patient. She stated that she saw on the news regarding a FDA approve called Wegovy. The drug previously approved for FDA use at a lower dose for Type 2 diabetes treatment. Patient stated that since she is prediabetic and need to lose weight would this something Raven Romero prescribed.   Please advise

## 2019-12-26 NOTE — Telephone Encounter (Signed)
Please notify patient that I don't have any knowledge or experience with this medication so I do not feel comfortable prescribing.

## 2019-12-26 NOTE — Telephone Encounter (Signed)
Pt left VM at Triage. She is asking Johny Drilling to call her she said she only wants to talk to Johny Drilling and has a "quick question to ask her"

## 2019-12-27 NOTE — Telephone Encounter (Signed)
Per DPR, left detail message of [name]'s comments for patient. 

## 2020-01-07 ENCOUNTER — Ambulatory Visit: Payer: BC Managed Care – PPO | Admitting: Certified Nurse Midwife

## 2020-01-08 ENCOUNTER — Other Ambulatory Visit: Payer: Self-pay

## 2020-01-09 ENCOUNTER — Encounter: Payer: Self-pay | Admitting: Obstetrics and Gynecology

## 2020-01-09 ENCOUNTER — Ambulatory Visit (INDEPENDENT_AMBULATORY_CARE_PROVIDER_SITE_OTHER): Payer: BC Managed Care – PPO | Admitting: Obstetrics and Gynecology

## 2020-01-09 VITALS — BP 132/70 | HR 88 | Temp 94.9°F | Resp 18 | Ht 61.0 in | Wt 187.4 lb

## 2020-01-09 DIAGNOSIS — N760 Acute vaginitis: Secondary | ICD-10-CM | POA: Diagnosis not present

## 2020-01-09 DIAGNOSIS — Z01419 Encounter for gynecological examination (general) (routine) without abnormal findings: Secondary | ICD-10-CM | POA: Diagnosis not present

## 2020-01-09 MED ORDER — ESTRADIOL 0.1 MG/GM VA CREA: TOPICAL_CREAM | VAGINAL | 2 refills | Status: DC

## 2020-01-09 NOTE — Patient Instructions (Signed)

## 2020-01-09 NOTE — Progress Notes (Signed)
59 y.o. G71P1001 Married Caucasian female here for annual exam.    Saw urologist recently for possible UTI but was told urine negative and had vaginal atrophy. She was advised to see GYN.  Patient complaining of vaginal itching and dryness. Painful intercourse. No vaginal discharge.  Possible odor.  Has to be careful with soaps.   She used estrogen cream in the past.   Covid vaccine completed.   PCP:  Vernona Rieger, NP   Patient's last menstrual period was 08/18/2013.           Sexually active: Yes.    The current method of family planning is tubal ligation.    Exercising: No.  some walking Smoker:  no  Health Maintenance: Pap: 01-04-19 Neg:Neg HR HPV, 11-14-17 neg, 11-10-16 Neg:Neg HR HPV,  History of abnormal Pap: No MMG: 07-08-19 3D/Neg/density B/BiRads1 Colonoscopy: 2018 Neg;next 2028 BMD: n/a  Result  n/a TDaP: 2013 Gardasil:   no HIV:Neg in past Hep C: 04-03-19 Neg Screening Labs:  PCP.   reports that she has never smoked. She has never used smokeless tobacco. She reports current alcohol use of about 6.0 standard drinks of alcohol per week. She reports that she does not use drugs.  Past Medical History:  Diagnosis Date  . Breast mass, right   . Cervical polyp 04/09/15   2 small polyps on ultrasound  . Hypertension   . Kidney stones   . Lichen sclerosus   . Long-term use of Plaquenil 06/06/2017  . Morphea   . Shingles     Past Surgical History:  Procedure Laterality Date  . BREAST EXCISIONAL BIOPSY Right 06/2015  . BREAST LUMPECTOMY WITH RADIOACTIVE SEED LOCALIZATION Right 06/25/2015   Procedure: RIGHT BREAST SEED LOCALIZATION LUMPECTOMY ;  Surgeon: Harriette Bouillon, MD;  Location: [facility];  Service: General;  Laterality: Right;  . CYSTECTOMY  1999   ovarian cystectomy  . EXTRACORPOREAL SHOCK WAVE LITHOTRIPSY Left 06/17/2019   Procedure: EXTRACORPOREAL SHOCK WAVE LITHOTRIPSY (ESWL);  Surgeon: Heloise Purpura, MD;  Location: [facility];  Service:  Urology;  Laterality: Left;  . NASAL SINUS SURGERY    . TUBAL LIGATION     BTSP    Current Outpatient Medications  Medication Sig Dispense Refill  . Cholecalciferol (VITAMIN D3) 125 MCG (5000 UT) CAPS Take 1 capsule by mouth daily.    . fluticasone (FLONASE) 50 MCG/ACT nasal spray Place 2 sprays into both nostrils as needed.    . hydrochlorothiazide (HYDRODIURIL) 25 MG tablet daily.     Marland Kitchen olmesartan (BENICAR) 40 MG tablet Take 40 mg by mouth daily.    Marland Kitchen omeprazole (PRILOSEC) 40 MG capsule Take 40 mg by mouth daily.   1  . TRULANCE 3 MG TABS Take 1 tablet by mouth daily.     No current facility-administered medications for this visit.    Family History  Problem Relation Age of Onset  . Hypertension Mother   . Breast cancer Mother   . Heart disease Mother   . Diabetes Paternal Grandmother   . Diabetes Paternal Grandfather   . Diabetes Father   . Heart disease Father     Review of Systems  All other systems reviewed and are negative.   Exam:   BP 132/70 (Cuff Size: Large)   Pulse 88   Temp (!) 94.9 F (34.9 C) (Temporal)   Resp 18   Ht 5\' 1"  (1.549 m)   Wt 187 lb 6.4 oz (85 kg)   LMP 08/18/2013   BMI  35.41 kg/m     General appearance: alert, cooperative and appears stated age Head: normocephalic, without obvious abnormality, atraumatic Neck: no adenopathy, supple, symmetrical, trachea midline and thyroid normal to inspection and palpation Lungs: clear to auscultation bilaterally Breasts: normal appearance, no masses or tenderness, No nipple retraction or dimpling, No nipple discharge or bleeding, No axillary adenopathy Heart: regular rate and rhythm Abdomen: soft, non-tender; no masses, no organomegaly Extremities: extremities normal, atraumatic, no cyanosis or edema Skin: skin color, texture, turgor normal. No rashes or lesions Lymph nodes: cervical, supraclavicular, and axillary nodes normal. Neurologic: grossly normal  Pelvic: External genitalia:  no lesions               No abnormal inguinal nodes palpated.              Urethra:  normal appearing urethra with no masses, tenderness or lesions              Bartholins and Skenes: normal                 Vagina: normal appearing vagina with normal color and discharge, no lesions              Cervix: no lesions              Pap taken: No. Bimanual Exam:  Uterus:  normal size, contour, position, consistency, mobility, non-tender              Adnexa: no mass, fullness, tenderness              Rectal exam: Yes.  .  Confirms.              Anus:  normal sphincter tone, no lesions  Chaperone was present for exam.  Assessment:   Well woman visit with normal exam. Lichen sclerosus.  FH breast cancer.  Morphea.  Vaginitis.  Plan: Mammogram screening discussed. Self breast awareness reviewed. Pap and HR HPV as above. Guidelines for Calcium, Vitamin D, regular exercise program including cardiovascular and weight bearing exercise. Affirm.  Start vaginal estrogen cream.  Instructed in use.  I discussed potential effect on breast cancer. Follow up annually and prn.     After visit summary provided.

## 2020-01-10 ENCOUNTER — Telehealth: Payer: Self-pay

## 2020-01-10 LAB — VAGINITIS/VAGINOSIS, DNA PROBE
Candida Species: NEGATIVE
Gardnerella vaginalis: POSITIVE — AB
Trichomonas vaginosis: NEGATIVE

## 2020-01-10 MED ORDER — METRONIDAZOLE 500 MG PO TABS: 500.0000 mg | ORAL_TABLET | Freq: Two times a day (BID) | ORAL | 0 refills | Status: DC

## 2020-01-10 NOTE — Telephone Encounter (Signed)
Spoke with pt. Pt given results and recommendations per Dr Edward Jolly. Pt agreeable and verbalized understanding.  Rx for Flagyl sent to pharmacy on file. #14, 0RF. Pt aware of ETOH precautions. Pt states will start Rx 01/11/20.  Routing to Dr Edward Jolly for review.  Encounter closed.

## 2020-01-10 NOTE — Telephone Encounter (Signed)
-----   Message from [name], MD sent at 01/10/2020 10:29 AM EDT ----- Please inform of Affirm result showing bacterial vaginosis. She may treat with Flagyl 500 mg po bid for 7 days or Metrogel pv at hs for 5 nights.  Please send Rx to pharmacy of choice. ETOH precautions.

## 2020-01-16 IMAGING — MG DIGITAL SCREENING BILAT W/ TOMO W/ CAD
8 series · 8 of 24 positions shown · non-contrast
Comparison: Previous exam(s).

CLINICAL DATA: Screening.

EXAM:
DIGITAL SCREENING BILATERAL MAMMOGRAM WITH TOMO AND CAD

[R MLO synth-2D]
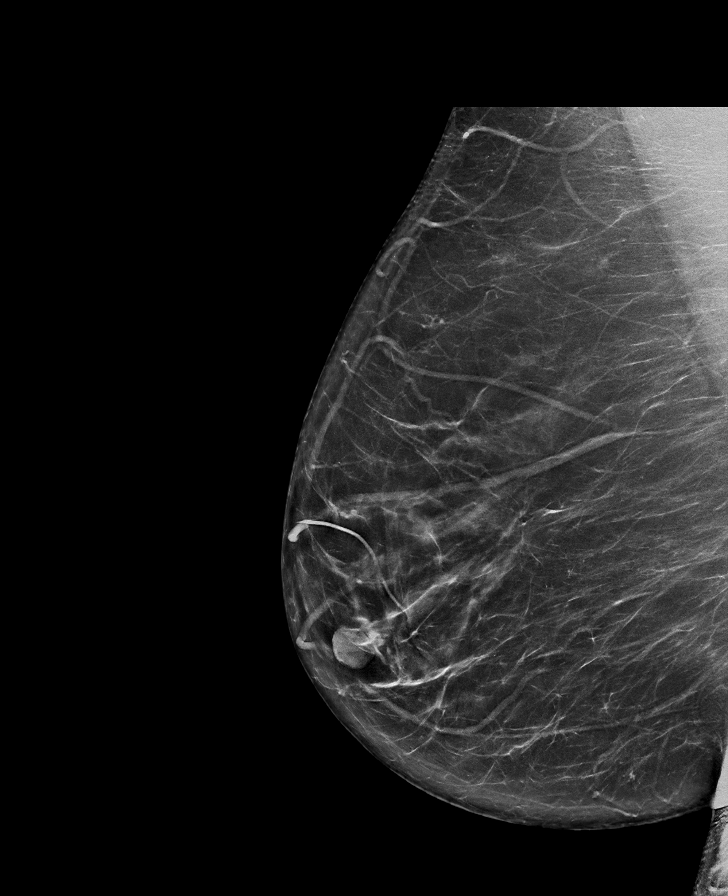

[L MLO synth-2D]
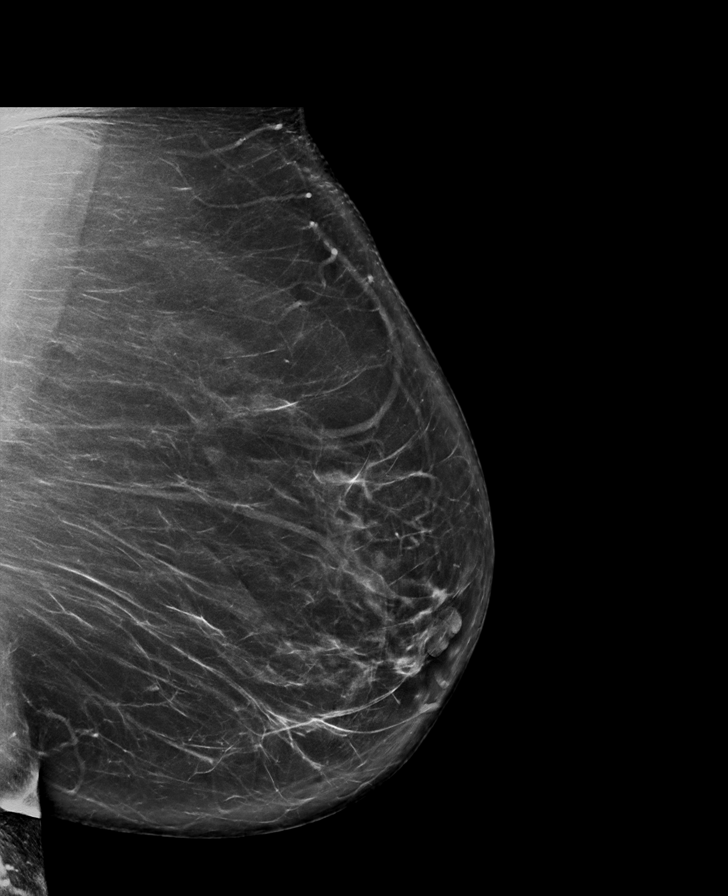

[L CC synth-2D]
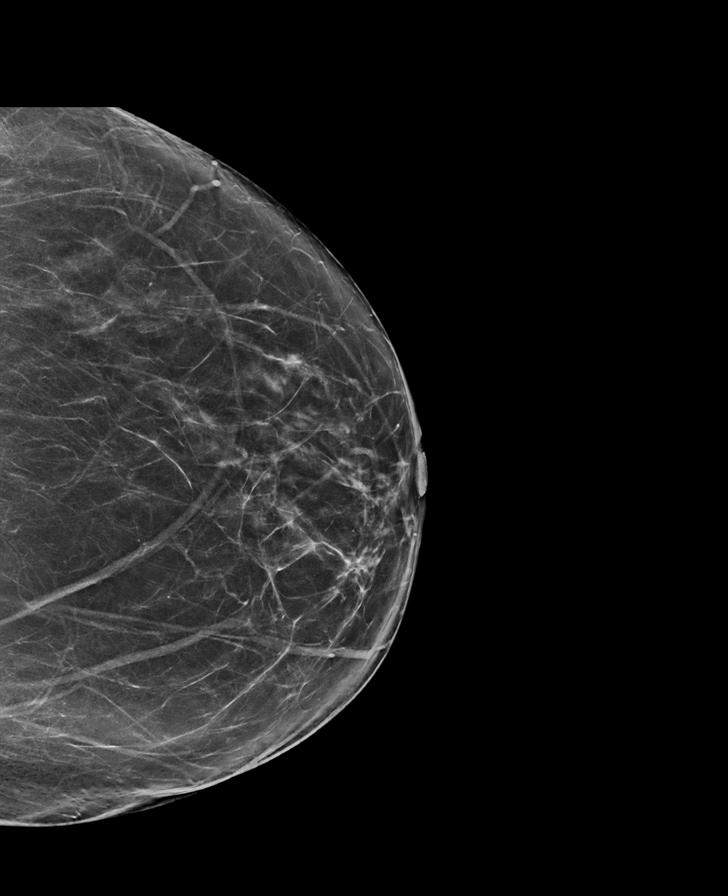

[R CC synth-2D]
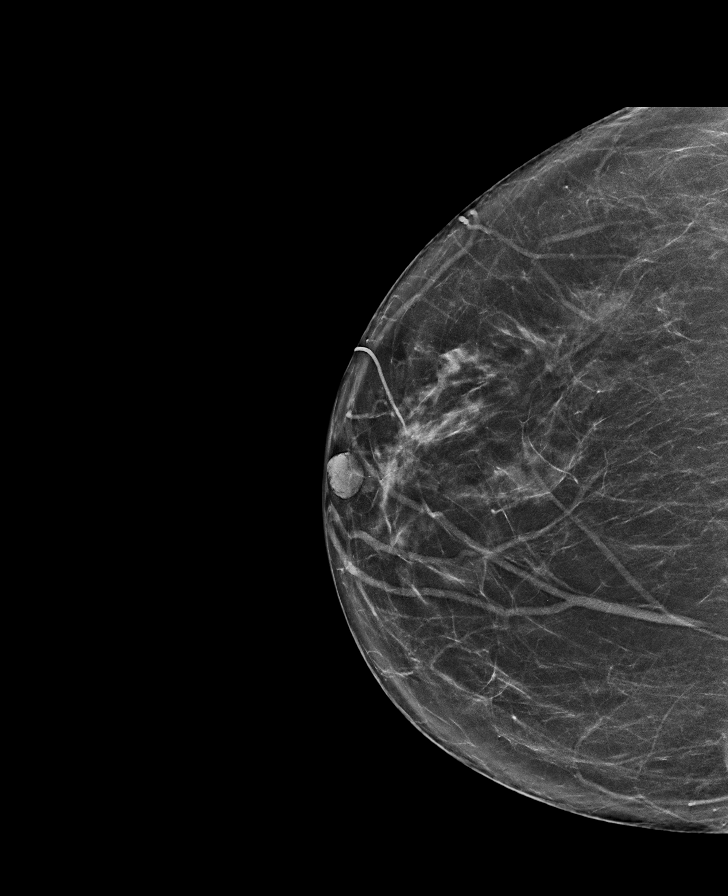

[R MLO tomo · tomo slice 41/80.0]
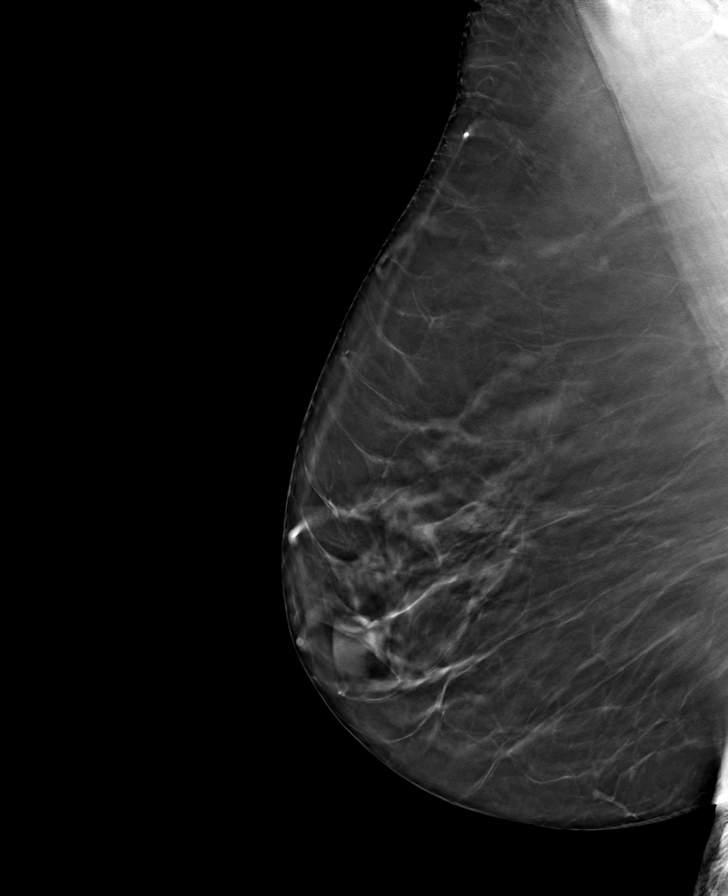

[L CC tomo · tomo slice 41/80.0]
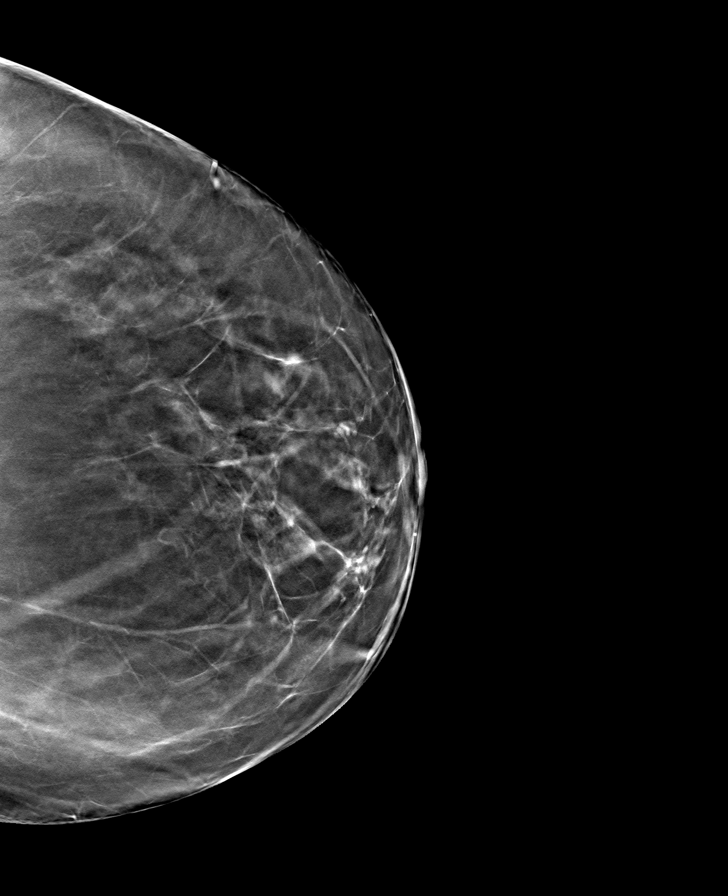

[L MLO tomo · tomo slice 45/90.0]
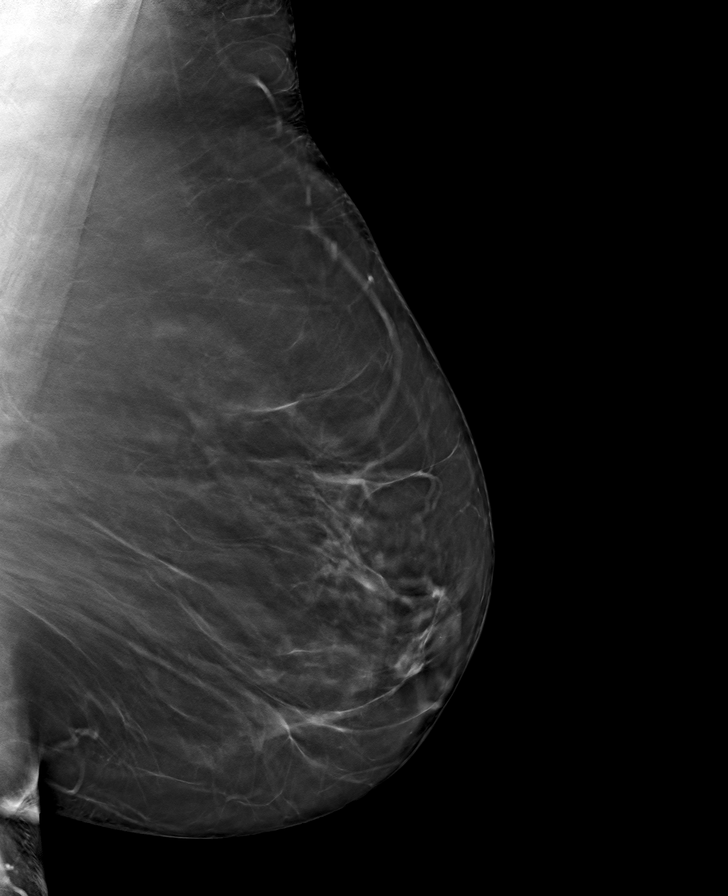

[R CC tomo · tomo slice 36/71.0]
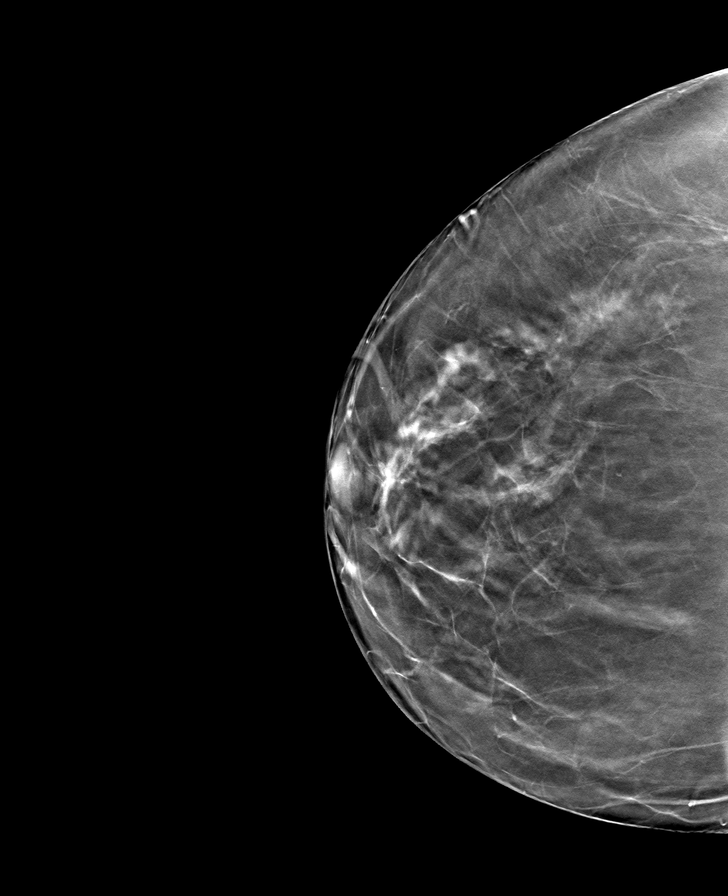

[8 of 24 positions shown; findings below may reference images not displayed]

ACR Breast Density Category b: There are scattered areas of
fibroglandular density.
FINDINGS: There are no findings suspicious for malignancy. Images were
processed with CAD.
IMPRESSION: No mammographic evidence of malignancy. A result letter of this
screening mammogram will be mailed directly to the patient.

RECOMMENDATION:
Screening mammogram in one year. (Code:CN-[id number])

BI-RADS CATEGORY  1: Negative.

## 2020-01-24 DIAGNOSIS — E782 Mixed hyperlipidemia: Secondary | ICD-10-CM | POA: Diagnosis not present

## 2020-01-24 DIAGNOSIS — I10 Essential (primary) hypertension: Secondary | ICD-10-CM | POA: Diagnosis not present

## 2020-01-29 ENCOUNTER — Ambulatory Visit
Admission: EM | Admit: 2020-01-29 | Discharge: 2020-01-29 | Disposition: A | Discharge status: Home / Self Care | Payer: BC Managed Care – PPO | Attending: Family Medicine | Admitting: Family Medicine

## 2020-01-29 DIAGNOSIS — J0110 Acute frontal sinusitis, unspecified: Secondary | ICD-10-CM

## 2020-01-29 MED ORDER — FLUCONAZOLE 200 MG PO TABS: 200.0000 mg | ORAL_TABLET | Freq: Once | ORAL | 0 refills | Status: AC

## 2020-01-29 MED ORDER — AZITHROMYCIN 250 MG PO TABS: 250.0000 mg | ORAL_TABLET | Freq: Every day | ORAL | 0 refills | Status: DC

## 2020-01-29 NOTE — ED Triage Notes (Signed)
Patient c/o nasal and chest congestion as well as cough and headaches.   Denies: fever, chest pain, ShOB  OTC: mucinex, sudafed

## 2020-01-29 NOTE — Discharge Instructions (Signed)
You have a sinus infection.  I have prescribed azithromycin.  Follow up with this office or with primary care if you are not feeling better over the next 2 days.  Follow up with the ER for trouble swallowing, trouble breathing, other concerning symptoms.

## 2020-01-29 NOTE — ED Provider Notes (Signed)
[facility]   [id number] 01/29/20 Arrival Time: 0831  VP:XTGG THROAT  SUBJECTIVE: History from: patient.  Raven Romero is a 59 y.o. female who presents with abrupt onset of nasal congestion, headache, fatigue, sinus pain and pressure for  the last 10 days. Denies to sick exposure to Covid, strep, flu or mono, or precipitating event. Has had both Covid vaccines. Has tried sudafed, mucinex, OTC allergy medication without relief. There are no aggravating symptoms. Reports previous symptoms in the past about once a year.  Denies fever, chills, fatigue, ear pain, rhinorrhea, SOB, wheezing, chest pain, nausea, rash, changes in bowel or bladder habits.     ROS: As per HPI.  All other pertinent ROS negative.     Past Medical History:  Diagnosis Date  . Breast mass, right   . Cervical polyp 04/09/15   2 small polyps on ultrasound  . Hypertension   . Kidney stones   . Lichen sclerosus   . Long-term use of Plaquenil 06/06/2017  . Morphea   . Shingles    Past Surgical History:  Procedure Laterality Date  . BREAST EXCISIONAL BIOPSY Right 06/2015  . BREAST LUMPECTOMY WITH RADIOACTIVE SEED LOCALIZATION Right 06/25/2015   Procedure: RIGHT BREAST SEED LOCALIZATION LUMPECTOMY ;  Surgeon: Harriette Bouillon, MD;  [facility];  Service: General;  Laterality: Right;  . CYSTECTOMY  1999   ovarian cystectomy  . EXTRACORPOREAL SHOCK WAVE LITHOTRIPSY Left 06/17/2019   Procedure: EXTRACORPOREAL SHOCK WAVE LITHOTRIPSY (ESWL);  Surgeon: Heloise Purpura, MD;  Location: [facility];  Service: Urology;  Laterality: Left;  . NASAL SINUS SURGERY    . TUBAL LIGATION     BTSP   Allergies  Allergen Reactions  . Macrobid  [Nitrofurantoin] Nausea Only  . Other     Medrol Dosepak - pt stated, "Made face feel like it was burning from the inside out; face was blood red"   No current facility-administered medications on file prior to encounter.   Current Outpatient Medications on  File Prior to Encounter  Medication Sig Dispense Refill  . Cholecalciferol (VITAMIN D3) 125 MCG (5000 UT) CAPS Take 1 capsule by mouth daily.    Marland Kitchen estradiol (ESTRACE) 0.1 MG/GM vaginal cream Use 1/2 g vaginally every night for the first 2 weeks, then use 1/2 g vaginally two or three times per week as needed to maintain symptom relief. 42.5 g 2  . fluticasone (FLONASE) 50 MCG/ACT nasal spray Place 2 sprays into both nostrils as needed.    . hydrochlorothiazide (HYDRODIURIL) 25 MG tablet daily.     . metroNIDAZOLE (FLAGYL) 500 MG tablet Take 1 tablet (500 mg total) by mouth 2 (two) times daily. 14 tablet 0  . olmesartan (BENICAR) 40 MG tablet Take 40 mg by mouth daily.    Marland Kitchen omeprazole (PRILOSEC) 40 MG capsule Take 40 mg by mouth daily.   1  . TRULANCE 3 MG TABS Take 1 tablet by mouth daily.     Social History   Socioeconomic History  . Marital status: Married    Spouse name: Not on file  . Number of children: Not on file  . Years of education: Not on file  . Highest education level: Not on file  Occupational History  . Not on file  Tobacco Use  . Smoking status: Never Smoker  . Smokeless tobacco: Never Used  Vaping Use  . Vaping Use: Never used  Substance and Sexual Activity  . Alcohol use: Yes    Alcohol/week: 6.0  standard drinks    Types: 6 Standard drinks or equivalent per week  . Drug use: No  . Sexual activity: Yes    Partners: Male    Birth control/protection: Surgical    Comment: btsp  Other Topics Concern  . Not on file  Social History Narrative   Married.    1 child, 2 grandchildren.   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   Enjoys traveling to the beach.    Social Determinants of Health   Financial Resource Strain:   . Difficulty of Paying Living Expenses:   Food Insecurity:   . Worried About Programme researcher, broadcasting/film/video in the Last Year:   . Barista in the Last Year:   Transportation Needs:   . Freight forwarder (Medical):   Marland Kitchen Lack of Transportation (Non-Medical):    Physical Activity:   . Days of Exercise per Week:   . Minutes of Exercise per Session:   Stress:   . Feeling of Stress :   Social Connections:   . Frequency of Communication with Friends and Family:   . Frequency of Social Gatherings with Friends and Family:   . Attends Religious Services:   . Active Member of Clubs or Organizations:   . Attends Banker Meetings:   Marland Kitchen Marital Status:   Intimate Partner Violence:   . Fear of Current or Ex-Partner:   . Emotionally Abused:   Marland Kitchen Physically Abused:   . Sexually Abused:    Family History  Problem Relation Age of Onset  . Hypertension Mother   . Breast cancer Mother   . Heart disease Mother   . Diabetes Paternal Grandmother   . Diabetes Paternal Grandfather   . Diabetes Father   . Heart disease Father     OBJECTIVE:  Vitals:   01/29/20 0835  BP: 136/76  Pulse: 92  Resp: 16  Temp: (!) 97.4 F (36.3 C)  SpO2: 98%     General appearance: alert; appears fatigued, but nontoxic, speaking in full sentences and managing own secretions HEENT: NCAT; Ears: EACs clear, TMs pearly gray with visible cone of light, without erythema; Eyes: PERRL, EOMI grossly; Nose: no obvious rhinorrhea; Throat: oropharynx clear, tonsils 1+ and mildly erythematous without white tonsillar exudates, uvula midline; Sinuses: frontal tenderness Neck: supple with right sided LAD Lungs: CTA bilaterally without adventitious breath sounds; cough absent Heart: regular rate and rhythm.  Radial pulses 2+ symmetrical bilaterally Skin: warm and dry Psychological: alert and cooperative; normal mood and affect  LABS: No results found for this or any previous visit (from the past 24 hour(s)).   ASSESSMENT & PLAN:  1. Acute non-recurrent frontal sinusitis     Meds ordered this encounter  Medications  . azithromycin (ZITHROMAX) 250 MG tablet    Sig: Take 1 tablet (250 mg total) by mouth daily. Take first 2 tablets together, then 1 every day until  finished.    Dispense:  6 tablet    Refill:  0    Order Specific Question:   Supervising Provider    Answer:   Merrilee Jansky [id number]  . fluconazole (DIFLUCAN) 200 MG tablet    Sig: Take 1 tablet (200 mg total) by mouth once for 1 dose.    Dispense:  2 tablet    Refill:  0    Order Specific Question:   Supervising Provider    Answer:   Merrilee Jansky [id number]    Acute Sinusitis Push fluids and get rest Prescribed azithromycin.  Prescribed fluconazole in case of yeast Take as directed and to completion.  Drink warm or cool liquids, use throat lozenges, or popsicles to help alleviate symptoms Take OTC ibuprofen or tylenol as needed for pain Follow up with PCP if symptoms persist Return or go to ER if you have any new or worsening symptoms such as fever, chills, nausea, vomiting, worsening sore throat, cough, abdominal pain, chest pain, changes in bowel or bladder habits.   Reviewed expectations re: course of current medical issues. Questions answered. Outlined signs and symptoms indicating need for more acute intervention. Patient verbalized understanding. After Visit Summary given.           Moshe Cipro, NP 01/29/20 [phone]

## 2020-02-12 ENCOUNTER — Telehealth: Payer: Self-pay | Admitting: Obstetrics and Gynecology

## 2020-02-12 NOTE — Telephone Encounter (Signed)
AEX 01/09/20 +BV 01/09/20, was treated with Flagyl H/o Kidney stones  Seen by urology fall of 2020.  Spoke with pt. Pt states since was seen for AEX in June and was +BV,  sx never really resolved completely. Pt did take the full course of Flagyl, but pt states made her "sick". Pt states still having vaginal discharge, odor and itching. Pt has tried Monistat OTC and sx did not resolve. Pt states has not followed up with urologist since last fall. Pt states does not want to take Flagyl again.  Pt denies fever, chills, UTI sx.   Pt advised to have OV for further evaluation. Pt agreeable.  Pt scheduled as work-in appt with Dr Edward Jolly on 02/13/20 at 915 am. Pt agreeable and verbalized understanding of date and time of appt.   Routing to Dr Edward Jolly for review.  Encounter closed.

## 2020-02-12 NOTE — Progress Notes (Signed)
GYNECOLOGY  VISIT   HPI: 59 y.o.   Married  Caucasian  female   G1P1001 with Patient's last menstrual period was 08/18/2013.   here for yeast or BV. Patient states that she is having vaginal burning, vulvar itching, discharge, urinary frequency, urgency, dysuria.   She is having a burning feeling even with just sitting.  States her symptoms are ongoing since the fall, 2020.   She denies back pain, fever, nausea and vomiting.   She is having vulvar itching.  She is also having burning with soap use and this comes and goes.  She is using Dial.   Seen on 01/09/20 and dx with BV.  Tx with Flagyl, and she completed the course but she had nausea. She states it did not treat her symptoms.   She is having more outbreaks of morphea, and she is worried that there is underlying infection that may be causing this.   She has a sinus infection 3 weeks ago, treated with antibiotic.  She also took a Diflucan.   She is using vaginal estrogen cream twice a week for vaginal atrophy.  Her last dose was 3 days ago.   She also used Monistat 3 days ago, and got a little of relief from that.   She does have a history of kidney stones.  She had lithotripsy in December.  Husband is being treated for pelvic pain at [facility].   Urine dip - negative.  GYNECOLOGIC HISTORY: Patient's last menstrual period was 08/18/2013. Contraception: tubal Menopausal hormone therapy: Estrace cream Last mammogram: 07-08-19 3D/Neg/density B/BiRads1  Last pap smear: 01-04-19 Neg:Neg HR HPV, 11-14-17 neg, 11-10-16 Neg:Neg HR HPV        OB History    Gravida  1   Para  1   Term  1   Preterm      AB      Living  1     SAB      TAB      Ectopic      Multiple      Live Births  1              Patient Active Problem List   Diagnosis Date Noted  . Preventative health care 04/10/2019  . Morphea 04/10/2019  . GERD (gastroesophageal reflux disease) 04/10/2019  . Dysuria 04/10/2019  .  Hyperglycemia 05/03/2018  . Lichen sclerosus 12/12/2017  . Age-related nuclear cataract of both eyes 06/06/2017  . Hypertensive retinopathy of both eyes 06/06/2017  . Cervical spondylosis 01/16/2017  . Dyslipidemia (high LDL; low HDL) 12/10/2014  . Essential hypertension 12/10/2014  . Mixed incontinence 10/26/2012    Past Medical History:  Diagnosis Date  . Breast mass, right   . Cervical polyp 04/09/15   2 small polyps on ultrasound  . Hypertension   . Kidney stones   . Lichen sclerosus   . Long-term use of Plaquenil 06/06/2017  . Morphea   . Shingles     Past Surgical History:  Procedure Laterality Date  . BREAST EXCISIONAL BIOPSY Right 06/2015  . BREAST LUMPECTOMY WITH RADIOACTIVE SEED LOCALIZATION Right 06/25/2015   Procedure: RIGHT BREAST SEED LOCALIZATION LUMPECTOMY ;  Surgeon: Harriette Bouillon, MD;  Location: [facility];  Service: General;  Laterality: Right;  . CYSTECTOMY  1999   ovarian cystectomy  . EXTRACORPOREAL SHOCK WAVE LITHOTRIPSY Left 06/17/2019   Procedure: EXTRACORPOREAL SHOCK WAVE LITHOTRIPSY (ESWL);  Surgeon: Heloise Purpura, MD;  Location: [facility];  Service: Urology;  Laterality: Left;  .  NASAL SINUS SURGERY    . TUBAL LIGATION     BTSP    Current Outpatient Medications  Medication Sig Dispense Refill  . Cholecalciferol (VITAMIN D3) 125 MCG (5000 UT) CAPS Take 1 capsule by mouth daily.    Marland Kitchen estradiol (ESTRACE) 0.1 MG/GM vaginal cream Use 1/2 g vaginally every night for the first 2 weeks, then use 1/2 g vaginally two or three times per week as needed to maintain symptom relief. 42.5 g 2  . fluticasone (FLONASE) 50 MCG/ACT nasal spray Place 2 sprays into both nostrils as needed.    . hydrochlorothiazide (HYDRODIURIL) 25 MG tablet daily.     Marland Kitchen olmesartan (BENICAR) 40 MG tablet Take 40 mg by mouth daily.    Marland Kitchen omeprazole (PRILOSEC) 40 MG capsule Take 40 mg by mouth daily.   1  . TRULANCE 3 MG TABS Take 1 tablet by mouth daily.     No current  facility-administered medications for this visit.     ALLERGIES: Flagyl [metronidazole], Macrobid  [nitrofurantoin], and Other  Family History  Problem Relation Age of Onset  . Hypertension Mother   . Breast cancer Mother   . Heart disease Mother   . Diabetes Paternal Grandmother   . Diabetes Paternal Grandfather   . Diabetes Father   . Heart disease Father     Social History   Socioeconomic History  . Marital status: Married    Spouse name: Not on file  . Number of children: Not on file  . Years of education: Not on file  . Highest education level: Not on file  Occupational History  . Not on file  Tobacco Use  . Smoking status: Never Smoker  . Smokeless tobacco: Never Used  Vaping Use  . Vaping Use: Never used  Substance and Sexual Activity  . Alcohol use: Yes    Alcohol/week: 6.0 standard drinks    Types: 6 Standard drinks or equivalent per week  . Drug use: No  . Sexual activity: Yes    Partners: Male    Birth control/protection: Surgical    Comment: btsp  Other Topics Concern  . Not on file  Social History Narrative   Married.    1 child, 2 grandchildren.   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   Enjoys traveling to the beach.    Social Determinants of Health   Financial Resource Strain:   . Difficulty of Paying Living Expenses:   Food Insecurity:   . Worried About Programme researcher, broadcasting/film/video in the Last Year:   . Barista in the Last Year:   Transportation Needs:   . Freight forwarder (Medical):   Marland Kitchen Lack of Transportation (Non-Medical):   Physical Activity:   . Days of Exercise per Week:   . Minutes of Exercise per Session:   Stress:   . Feeling of Stress :   Social Connections:   . Frequency of Communication with Friends and Family:   . Frequency of Social Gatherings with Friends and Family:   . Attends Religious Services:   . Active Member of Clubs or Organizations:   . Attends Banker Meetings:   Marland Kitchen Marital Status:   Intimate Partner Violence:    . Fear of Current or Ex-Partner:   . Emotionally Abused:   Marland Kitchen Physically Abused:   . Sexually Abused:     Review of Systems  Constitutional: Negative.   HENT: Negative.   Eyes: Negative.   Respiratory: Negative.   Cardiovascular: Negative.  Gastrointestinal: Negative.   Endocrine: Negative.   Genitourinary: Positive for frequency, urgency and vaginal discharge.       Vaginal burning Vulvar itching  Musculoskeletal: Negative.   Skin: Negative.   Allergic/Immunologic: Negative.   Neurological: Negative.   Hematological: Negative.   Psychiatric/Behavioral: Negative.     PHYSICAL EXAMINATION:    BP 120/72 (BP Location: Right Arm, Patient Position: Sitting, Cuff Size: Normal)   Pulse 84   Resp 12   Ht 5\' 3"  (1.6 m)   Wt 189 lb (85.7 kg)   LMP 08/18/2013   BMI 33.48 kg/m     General appearance: alert, cooperative and appears stated age   Pelvic: External genitalia:  no lesions              Urethra:  normal appearing urethra with no masses, tenderness or lesions              Bartholins and Skenes: normal                 Vagina: normal appearing vagina with normal color and discharge, no lesions              Cervix: no lesions                Bimanual Exam:  Uterus:  normal size, contour, position, consistency, mobility, non-tender              Adnexa: no mass, fullness, tenderness               Chaperone was present for exam.  ASSESSMENT  Vulvovaginitis.  Chronic.  Recent BV and intolerance to Flagyl. Dysuria. Negative urine dip.  Lichen sclerosus/morphea. No vulvar lesions noted.  Followed by [facility]. Treated with hydroxychloroquine in past.  PLAN  Testing for GC/trich/BV/yeast. She will do a trial of Dove soap, sensitive skin line. We did a review of morphea in Up to Date.  No vaginal or bladder symptoms are usually a part of this disease process.  We reviewed vulvodynia and interstitial cystitis as potential explanations for some of her symptoms, although, these  are diagnoses of exclusion.  I did mention gabapentin cream as a treatment of vulvodynia.  Return to [facility] for a recheck of her morphea.    51 minute consultation.

## 2020-02-12 NOTE — Telephone Encounter (Signed)
Filosa, Mar Daring "Raven Romero"  P [facility] Hi Dr. Edward Jolly,  On my last visit, you prescribed the metronidazole, & I believe also back last fall, I don't believe the metronidazole helped, still having the same issue & as I remember last fall I ended up going to urologist after that , thinking it was another problem and he prescribed Cipro which cleared it right up, the metronidazole made me sick the entire time , I don't believe I want to take that ever again. Please advise, still having the same symptoms as the end of June.  Thanks Raven Romero

## 2020-02-13 ENCOUNTER — Ambulatory Visit (INDEPENDENT_AMBULATORY_CARE_PROVIDER_SITE_OTHER): Payer: BC Managed Care – PPO | Admitting: Obstetrics and Gynecology

## 2020-02-13 ENCOUNTER — Encounter: Payer: Self-pay | Admitting: Obstetrics and Gynecology

## 2020-02-13 ENCOUNTER — Other Ambulatory Visit (HOSPITAL_COMMUNITY)
Admission: RE | Admit: 2020-02-13 | Discharge: 2020-02-13 | Disposition: A | Discharge status: Home / Self Care | Payer: BC Managed Care – PPO | Source: Ambulatory Visit | Attending: Obstetrics and Gynecology | Admitting: Obstetrics and Gynecology

## 2020-02-13 ENCOUNTER — Other Ambulatory Visit: Payer: Self-pay

## 2020-02-13 VITALS — BP 120/72 | HR 84 | Resp 12 | Ht 63.0 in | Wt 189.0 lb

## 2020-02-13 DIAGNOSIS — Z113 Encounter for screening for infections with a predominantly sexual mode of transmission: Secondary | ICD-10-CM | POA: Diagnosis not present

## 2020-02-13 DIAGNOSIS — Z872 Personal history of diseases of the skin and subcutaneous tissue: Secondary | ICD-10-CM

## 2020-02-13 DIAGNOSIS — N761 Subacute and chronic vaginitis: Secondary | ICD-10-CM | POA: Insufficient documentation

## 2020-02-13 DIAGNOSIS — R350 Frequency of micturition: Secondary | ICD-10-CM

## 2020-02-13 LAB — POCT URINALYSIS DIPSTICK
Bilirubin, UA: NEGATIVE
Blood, UA: NEGATIVE
Glucose, UA: NEGATIVE
Ketones, UA: NEGATIVE
Leukocytes, UA: NEGATIVE
Nitrite, UA: NEGATIVE
Protein, UA: NEGATIVE
Urobilinogen, UA: 0.2 E.U./dL
pH, UA: 5 (ref 5.0–8.0)

## 2020-02-13 NOTE — Patient Instructions (Signed)
Interstitial Cystitis  Interstitial cystitis is inflammation of the bladder. This may cause pain in the bladder area as well as a frequent and urgent need to urinate. The bladder is a hollow organ in the lower part of the abdomen. It stores urine after the urine is made in the kidneys. The severity of interstitial cystitis can vary from person to person. You may have flare-ups, and then your symptoms may go away for a while. For many people, it becomes a long-term (chronic) problem. What are the causes? The cause of this condition is not known. What increases the risk? The following factors may make you more likely to develop this condition:  You are female.  You have fibromyalgia.  You have irritable bowel syndrome (IBS).  You have endometriosis. This condition may be aggravated by:  Stress.  Smoking.  Spicy foods. What are the signs or symptoms? Symptoms of interstitial cystitis vary, and they can change over time. Symptoms may include:  Discomfort or pain in the bladder area, which is in the lower abdomen. Pain can range from mild to severe. The pain may change in intensity as the bladder fills with urine or as it empties.  Pain in the pelvic area, between the hip bones.  An urgent need to urinate.  Frequent urination.  Pain during urination.  Pain during sex.  Blood in the urine. For women, symptoms often get worse during menstruation. How is this diagnosed? This condition is diagnosed based on your symptoms, your medical history, and a physical exam. You may have tests to rule out other conditions, such as:  Urine tests.  Cystoscopy. For this test, a tool similar to a very thin telescope is used to look into your bladder.  Biopsy. This involves taking a sample of tissue from the bladder to be examined under a microscope. How is this treated? There is no cure for this condition, but treatment can help you control your symptoms. Work closely with your health care  provider to find the most effective treatments for you. Treatment options may include:  Medicines to relieve pain and reduce how often you feel the need to urinate.  Learning ways to control when you urinate (bladder training).  Lifestyle changes, such as changing your diet or taking steps to control stress.  Using a device that provides electrical stimulation to your nerves, which can relieve pain (neuromodulation therapy). The device is placed on your back, where it blocks the nerves that cause you to feel pain in your bladder area.  A procedure that stretches your bladder by filling it with air or fluid.  Surgery. This is rare. It is only done for extreme cases, if other treatments do not help. Follow these instructions at home: Bladder training   Use bladder training techniques as directed. Techniques may include: ? Urinating at scheduled times. ? Training yourself to delay urination. ? Doing exercises (Kegel exercises) to strengthen the muscles that control urine flow.  Keep a bladder diary. ? Write down the times that you urinate and any symptoms that you have. This can help you find out which foods, liquids, or activities make your symptoms worse. ? Use your bladder diary to schedule bathroom trips. If you are away from home, plan to be near a bathroom at each of your scheduled times.  Make sure that you urinate just before you leave the house and just before you go to bed. Eating and drinking  Make dietary changes as recommended by your health care provider. You   may need to avoid: ? Spicy foods. ? Foods that contain a lot of potassium.  Limit your intake of beverages that make you need to urinate. These include: ? Caffeinated beverages like soda, coffee, and tea. ? Alcohol. General instructions  Take over-the-counter and prescription medicines only as told by your health care provider.  Do not drink alcohol.  You can try a warm or cool compress over your bladder for  comfort.  Avoid wearing tight clothing.  Do not use any products that contain nicotine or tobacco, such as cigarettes and e-cigarettes. If you need help quitting, ask your health care provider.  Keep all follow-up visits as told by your health care provider. This is important. Contact a health care provider if you have:  Symptoms that do not get better with treatment.  Pain or discomfort that gets worse.  More frequent urges to urinate.  A fever. Get help right away if:  You have no control over when you urinate. Summary  Interstitial cystitis is inflammation of the bladder.  This condition may cause pain in the bladder area as well as a frequent and urgent need to urinate.  You may have flare-ups of the condition, and then it may go away for a while. For many people, it becomes a long-term (chronic) problem.  There is no cure for interstitial cystitis, but treatment methods are available to control your symptoms. This information is not intended to replace advice given to you by your health care provider. Make sure you discuss any questions you have with your health care provider. Document Revised: 06/16/2017 Document Reviewed: 05/29/2017 Elsevier Patient Education  2020 [employer]  

## 2020-02-14 LAB — CERVICOVAGINAL ANCILLARY ONLY
Bacterial Vaginitis (gardnerella): NEGATIVE
Candida Glabrata: NEGATIVE
Candida Vaginitis: NEGATIVE
Chlamydia: NEGATIVE
Comment: NEGATIVE
Comment: NEGATIVE
Comment: NEGATIVE
Comment: NEGATIVE
Comment: NEGATIVE
Comment: NORMAL
Neisseria Gonorrhea: NEGATIVE
Trichomonas: NEGATIVE

## 2020-02-17 ENCOUNTER — Telehealth: Payer: Self-pay

## 2020-02-17 MED ORDER — TRIAMCINOLONE ACETONIDE 0.1 % EX OINT: TOPICAL_OINTMENT | CUTANEOUS | 1 refills | Status: DC

## 2020-02-17 NOTE — Telephone Encounter (Signed)
-----   Message from [name], MD sent at 02/17/2020  8:53 AM EDT ----- Please contact patient with results of testing.  She tested negative for yeast, bacterial vaginosis, trichomonas, gonorrhea, and chlamydia.  I recommend triamcinolone ointment 0.1% to the vulva in a thin layer bid x 2 weeks for a flare and twice weekly for maintenance.  Disp:  30 grams. Refill:  1.  If her symptoms persist, please have her return.

## 2020-02-17 NOTE — Telephone Encounter (Signed)
Spoke with pt. Pt given results and recommendations per Dr Edward Jolly. Pt agreeable and verbalized understanding.  Rx sent to pharmacy on file. Pt aware to call back for OV if sx are not resolved.   Encounter closed.

## 2020-03-30 ENCOUNTER — Telehealth: Payer: Self-pay | Admitting: Primary Care

## 2020-03-30 NOTE — Telephone Encounter (Signed)
Pt called to schedule cpx.  Last one was 04/10/2019  She needs this by 04/16/2020 to get her insurance at a cheaper rate  Can pt be worked in

## 2020-03-30 NOTE — Telephone Encounter (Signed)
Yes, of course. Anytime or day is fine.

## 2020-03-31 NOTE — Telephone Encounter (Signed)
Patient called office was able to schedule in time frame she needed. Will call if any issues. Patient aware and repeated back time and date of appointment.

## 2020-03-31 NOTE — Telephone Encounter (Signed)
Called patient to schedule. Could not LVM to schedule appointment.

## 2020-04-07 ENCOUNTER — Other Ambulatory Visit: Payer: Self-pay | Admitting: Obstetrics and Gynecology

## 2020-04-07 DIAGNOSIS — Z1231 Encounter for screening mammogram for malignant neoplasm of breast: Secondary | ICD-10-CM

## 2020-04-10 ENCOUNTER — Other Ambulatory Visit: Payer: Self-pay

## 2020-04-10 ENCOUNTER — Encounter: Payer: Self-pay | Admitting: Primary Care

## 2020-04-10 ENCOUNTER — Ambulatory Visit (INDEPENDENT_AMBULATORY_CARE_PROVIDER_SITE_OTHER): Payer: BC Managed Care – PPO | Admitting: Primary Care

## 2020-04-10 VITALS — BP 108/64 | HR 87 | Temp 96.0°F | Ht 63.0 in | Wt 191.0 lb

## 2020-04-10 DIAGNOSIS — E785 Hyperlipidemia, unspecified: Secondary | ICD-10-CM

## 2020-04-10 DIAGNOSIS — Z114 Encounter for screening for human immunodeficiency virus [HIV]: Secondary | ICD-10-CM | POA: Diagnosis not present

## 2020-04-10 DIAGNOSIS — Z0000 Encounter for general adult medical examination without abnormal findings: Secondary | ICD-10-CM | POA: Diagnosis not present

## 2020-04-10 DIAGNOSIS — Z23 Encounter for immunization: Secondary | ICD-10-CM | POA: Diagnosis not present

## 2020-04-10 DIAGNOSIS — L940 Localized scleroderma [morphea]: Secondary | ICD-10-CM

## 2020-04-10 DIAGNOSIS — K219 Gastro-esophageal reflux disease without esophagitis: Secondary | ICD-10-CM

## 2020-04-10 DIAGNOSIS — I10 Essential (primary) hypertension: Secondary | ICD-10-CM | POA: Diagnosis not present

## 2020-04-10 DIAGNOSIS — N200 Calculus of kidney: Secondary | ICD-10-CM | POA: Insufficient documentation

## 2020-04-10 HISTORY — DX: Calculus of kidney: N20.0

## 2020-04-10 NOTE — Progress Notes (Signed)
Subjective:    Patient ID: Raven Romero, female    DOB: Feb 20, 1961, 59 y.o.   MRN: [id number]  HPI  This visit occurred during the SARS-CoV-2 public health emergency.  Safety protocols were in place, including screening questions prior to the visit, additional usage of staff PPE, and extensive cleaning of exam room while observing appropriate contact time as indicated for disinfecting solutions.   [name] is a 59 year old female who presents today for complete physical.  Immunizations: -Tetanus: Completed in 2031 -Influenza: Due today -Shingles: Declines today, will check with opthalmology  -Covid-19: Completed series  Diet: She endorses a poor diet.  Exercise: She is walking several times weekly   Eye exam: Completes regularly  Dental exam: Completes semi-annually   Pap Smear: Completed in 2020 Mammogram: Scheduled for December 2021 Colonoscopy: Completed in 2018, due in 2023 Hep C Screen: Negative   BP Readings from Last 3 Encounters:  04/10/20 108/64  02/13/20 120/72  01/29/20 136/76     Review of Systems  Constitutional: Negative for unexpected weight change.  HENT: Positive for sinus pressure. Negative for rhinorrhea.   Respiratory: Negative for cough and shortness of breath.   Cardiovascular: Negative for chest pain.  Gastrointestinal: Negative for constipation and diarrhea.  Genitourinary: Negative for difficulty urinating.  Musculoskeletal: Negative for arthralgias and myalgias.  Skin: Negative for rash.  Allergic/Immunologic: Positive for environmental allergies.  Neurological: Negative for dizziness, numbness and headaches.  Psychiatric/Behavioral: The patient is not nervous/anxious.        Past Medical History:  Diagnosis Date  . Breast mass, right   . Cervical polyp 04/09/15   2 small polyps on ultrasound  . Hypertension   . Kidney stones   . Lichen sclerosus   . Long-term use of Plaquenil 06/06/2017  . Morphea   . Shingles      Social  History   Socioeconomic History  . Marital status: Married    Spouse name: Not on file  . Number of children: Not on file  . Years of education: Not on file  . Highest education level: Not on file  Occupational History  . Not on file  Tobacco Use  . Smoking status: Never Smoker  . Smokeless tobacco: Never Used  Vaping Use  . Vaping Use: Never used  Substance and Sexual Activity  . Alcohol use: Yes    Alcohol/week: 6.0 standard drinks    Types: 6 Standard drinks or equivalent per week  . Drug use: No  . Sexual activity: Yes    Partners: Male    Birth control/protection: Surgical    Comment: btsp  Other Topics Concern  . Not on file  Social History Narrative   Married.    1 child, 2 grandchildren.   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   Enjoys traveling to the beach.    Social Determinants of Health   Financial Resource Strain:   . Difficulty of Paying Living Expenses: Not on file  Food Insecurity:   . Worried About Programme researcher, broadcasting/film/video in the Last Year: Not on file  . Ran Out of Food in the Last Year: Not on file  Transportation Needs:   . Lack of Transportation (Medical): Not on file  . Lack of Transportation (Non-Medical): Not on file  Physical Activity:   . Days of Exercise per Week: Not on file  . Minutes of Exercise per Session: Not on file  Stress:   . Feeling of Stress : Not on file  Social  Connections:   . Frequency of Communication with Friends and Family: Not on file  . Frequency of Social Gatherings with Friends and Family: Not on file  . Attends Religious Services: Not on file  . Active Member of Clubs or Organizations: Not on file  . Attends Banker Meetings: Not on file  . Marital Status: Not on file  Intimate Partner Violence:   . Fear of Current or Ex-Partner: Not on file  . Emotionally Abused: Not on file  . Physically Abused: Not on file  . Sexually Abused: Not on file    Past Surgical History:  Procedure Laterality Date  . BREAST EXCISIONAL  BIOPSY Right 06/2015  . BREAST LUMPECTOMY WITH RADIOACTIVE SEED LOCALIZATION Right 06/25/2015   Procedure: RIGHT BREAST SEED LOCALIZATION LUMPECTOMY ;  Surgeon: Harriette Bouillon, MD;  Location: [facility];  Service: General;  Laterality: Right;  . CYSTECTOMY  1999   ovarian cystectomy  . EXTRACORPOREAL SHOCK WAVE LITHOTRIPSY Left 06/17/2019   Procedure: EXTRACORPOREAL SHOCK WAVE LITHOTRIPSY (ESWL);  Surgeon: Heloise Purpura, MD;  Location: [facility];  Service: Urology;  Laterality: Left;  . NASAL SINUS SURGERY    . TUBAL LIGATION     BTSP    Family History  Problem Relation Age of Onset  . Hypertension Mother   . Breast cancer Mother   . Heart disease Mother   . Diabetes Paternal Grandmother   . Diabetes Paternal Grandfather   . Diabetes Father   . Heart disease Father     Allergies  Allergen Reactions  . Flagyl [Metronidazole] Nausea And Vomiting  . Macrobid  [Nitrofurantoin] Nausea Only  . Other     Medrol Dosepak - pt stated, "Made face feel like it was burning from the inside out; face was blood red"    Current Outpatient Medications on File Prior to Visit  Medication Sig Dispense Refill  . Cholecalciferol (VITAMIN D3) 125 MCG (5000 UT) CAPS Take 1 capsule by mouth daily.    Marland Kitchen estradiol (ESTRACE) 0.1 MG/GM vaginal cream Use 1/2 g vaginally every night for the first 2 weeks, then use 1/2 g vaginally two or three times per week as needed to maintain symptom relief. 42.5 g 2  . fluticasone (FLONASE) 50 MCG/ACT nasal spray Place 2 sprays into both nostrils as needed.    . hydrochlorothiazide (HYDRODIURIL) 25 MG tablet daily.     Marland Kitchen olmesartan (BENICAR) 40 MG tablet Take 40 mg by mouth daily.    Marland Kitchen omeprazole (PRILOSEC) 40 MG capsule Take 40 mg by mouth daily.   1  . triamcinolone ointment (KENALOG) 0.1 % triamcinolone ointment 0.1% to the vulva in a thin layer bid x 2 weeks for a flare and twice weekly for maintenance. 30 g 1  . TRULANCE 3 MG TABS Take 1 tablet by mouth  daily.     No current facility-administered medications on file prior to visit.    BP 108/64   Pulse 87   Temp (!) 96 F (35.6 C) (Temporal)   Ht 5\' 3"  (1.6 m)   Wt 191 lb (86.6 kg)   LMP 08/18/2013   BMI 33.83 kg/m    Objective:   Physical Exam HENT:     Right Ear: Tympanic membrane and ear canal normal.     Left Ear: Tympanic membrane and ear canal normal.  Eyes:     Pupils: Pupils are equal, round, and reactive to light.  Cardiovascular:     Rate and Rhythm: Normal rate and  regular rhythm.  Pulmonary:     Effort: Pulmonary effort is normal.     Breath sounds: Normal breath sounds.  Abdominal:     General: Bowel sounds are normal.     Palpations: Abdomen is soft.     Tenderness: There is no abdominal tenderness.  Musculoskeletal:        General: Normal range of motion.     Cervical back: Neck supple.  Skin:    General: Skin is warm and dry.  Neurological:     Mental Status: She is alert and oriented to person, place, and time.     Cranial Nerves: No cranial nerve deficit.     Deep Tendon Reflexes:     Reflex Scores:      Patellar reflexes are 2+ on the right side and 2+ on the left side. Psychiatric:        Mood and Affect: Mood normal.            Assessment & Plan:

## 2020-04-10 NOTE — Assessment & Plan Note (Signed)
Stable, following with ophthalmology.

## 2020-04-10 NOTE — Assessment & Plan Note (Addendum)
Blood pressure well controlled today on olmesartan & HCTZ will continue.   Labs pending    Agree with assessment and plan. Doreene Nest, NP

## 2020-04-10 NOTE — Progress Notes (Signed)
Subjective:    Patient ID: Raven Romero, female    DOB: 1961-02-19, 59 y.o.   MRN: [id number]  HPI  This visit occurred during the SARS-CoV-2 public health emergency.  Safety protocols were in place, including screening questions prior to the visit, additional usage of staff PPE, and extensive cleaning of exam room while observing appropriate contact time as indicated for disinfecting solutions.   [name] is a 59 year-old female with a history of hypertension, GERD & dyslipidemia who presents today for complete physical.  Immunizations: -Tetanus: UTD -Influenza: received today  -Shingles: Will consider  -Covid-19: Moderna completed second dose 10/23/19    Diet: Endorses a poor diet   Exercise: Walking a few times a week   Eye exam: UTD, yearly   Dental exam: twice a year   Pap Smear: UTD due 2023 Mammogram: UTD due dec 2021 Colonoscopy: UTD Hep C Screen: Completed 2020  BP Readings from Last 3 Encounters:  04/10/20 108/64  02/13/20 120/72  01/29/20 136/76     Review of Systems  Constitutional: Negative.   HENT: Negative.   Eyes: Negative.   Respiratory: Negative.  Negative for choking and shortness of breath.   Cardiovascular: Negative.  Negative for chest pain.  Gastrointestinal: Negative.  Negative for constipation, nausea and vomiting.  Endocrine: Negative.   Genitourinary: Negative.   Musculoskeletal: Negative.  Negative for joint swelling and myalgias.  Skin: Negative.  Negative for rash.  Neurological: Negative.  Negative for dizziness and syncope.  Psychiatric/Behavioral: Negative.        Past Medical History:  Diagnosis Date  . Breast mass, right   . Cervical polyp 04/09/15   2 small polyps on ultrasound  . Hypertension   . Kidney stones   . Lichen sclerosus   . Long-term use of Plaquenil 06/06/2017  . Morphea   . Shingles      Social History   Socioeconomic History  . Marital status: Married    Spouse name: Not on file  . Number of  children: Not on file  . Years of education: Not on file  . Highest education level: Not on file  Occupational History  . Not on file  Tobacco Use  . Smoking status: Never Smoker  . Smokeless tobacco: Never Used  Vaping Use  . Vaping Use: Never used  Substance and Sexual Activity  . Alcohol use: Yes    Alcohol/week: 6.0 standard drinks    Types: 6 Standard drinks or equivalent per week  . Drug use: No  . Sexual activity: Yes    Partners: Male    Birth control/protection: Surgical    Comment: btsp  Other Topics Concern  . Not on file  Social History Narrative   Married.    1 child, 2 grandchildren.   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   Enjoys traveling to the beach.    Social Determinants of Health   Financial Resource Strain:   . Difficulty of Paying Living Expenses: Not on file  Food Insecurity:   . Worried About Programme researcher, broadcasting/film/video in the Last Year: Not on file  . Ran Out of Food in the Last Year: Not on file  Transportation Needs:   . Lack of Transportation (Medical): Not on file  . Lack of Transportation (Non-Medical): Not on file  Physical Activity:   . Days of Exercise per Week: Not on file  . Minutes of Exercise per Session: Not on file  Stress:   . Feeling of Stress : Not  on file  Social Connections:   . Frequency of Communication with Friends and Family: Not on file  . Frequency of Social Gatherings with Friends and Family: Not on file  . Attends Religious Services: Not on file  . Active Member of Clubs or Organizations: Not on file  . Attends Banker Meetings: Not on file  . Marital Status: Not on file  Intimate Partner Violence:   . Fear of Current or Ex-Partner: Not on file  . Emotionally Abused: Not on file  . Physically Abused: Not on file  . Sexually Abused: Not on file    Past Surgical History:  Procedure Laterality Date  . BREAST EXCISIONAL BIOPSY Right 06/2015  . BREAST LUMPECTOMY WITH RADIOACTIVE SEED LOCALIZATION Right 06/25/2015    Procedure: RIGHT BREAST SEED LOCALIZATION LUMPECTOMY ;  Surgeon: Harriette Bouillon, MD;  Location: [facility];  Service: General;  Laterality: Right;  . CYSTECTOMY  1999   ovarian cystectomy  . EXTRACORPOREAL SHOCK WAVE LITHOTRIPSY Left 06/17/2019   Procedure: EXTRACORPOREAL SHOCK WAVE LITHOTRIPSY (ESWL);  Surgeon: Heloise Purpura, MD;  Location: [facility];  Service: Urology;  Laterality: Left;  . NASAL SINUS SURGERY    . TUBAL LIGATION     BTSP    Family History  Problem Relation Age of Onset  . Hypertension Mother   . Breast cancer Mother   . Heart disease Mother   . Diabetes Paternal Grandmother   . Diabetes Paternal Grandfather   . Diabetes Father   . Heart disease Father     Allergies  Allergen Reactions  . Flagyl [Metronidazole] Nausea And Vomiting  . Macrobid  [Nitrofurantoin] Nausea Only  . Other     Medrol Dosepak - pt stated, "Made face feel like it was burning from the inside out; face was blood red"    Current Outpatient Medications on File Prior to Visit  Medication Sig Dispense Refill  . Cholecalciferol (VITAMIN D3) 125 MCG (5000 UT) CAPS Take 1 capsule by mouth daily.    Marland Kitchen estradiol (ESTRACE) 0.1 MG/GM vaginal cream Use 1/2 g vaginally every night for the first 2 weeks, then use 1/2 g vaginally two or three times per week as needed to maintain symptom relief. 42.5 g 2  . fluticasone (FLONASE) 50 MCG/ACT nasal spray Place 2 sprays into both nostrils as needed.    . hydrochlorothiazide (HYDRODIURIL) 25 MG tablet daily.     Marland Kitchen olmesartan (BENICAR) 40 MG tablet Take 40 mg by mouth daily.    Marland Kitchen omeprazole (PRILOSEC) 40 MG capsule Take 40 mg by mouth daily.   1  . triamcinolone ointment (KENALOG) 0.1 % triamcinolone ointment 0.1% to the vulva in a thin layer bid x 2 weeks for a flare and twice weekly for maintenance. 30 g 1  . TRULANCE 3 MG TABS Take 1 tablet by mouth daily.     No current facility-administered medications on file prior to visit.    BP 108/64    Pulse 87   Temp (!) 96 F (35.6 C) (Temporal)   Ht 5\' 3"  (1.6 m)   Wt 191 lb (86.6 kg)   LMP 08/18/2013   BMI 33.83 kg/m       Objective:   Physical Exam Constitutional:      Appearance: Normal appearance.  HENT:     Head: Normocephalic.     Right Ear: Tympanic membrane normal.     Left Ear: Tympanic membrane normal.  Eyes:     Extraocular Movements: Extraocular movements  intact.     Pupils: Pupils are equal, round, and reactive to light.  Cardiovascular:     Rate and Rhythm: Normal rate and regular rhythm.     Pulses: Normal pulses.     Heart sounds: Normal heart sounds.  Pulmonary:     Effort: Pulmonary effort is normal.     Breath sounds: Normal breath sounds.  Abdominal:     General: Bowel sounds are normal. There is no distension.     Palpations: Abdomen is soft.     Tenderness: There is no abdominal tenderness. There is no guarding.     Hernia: No hernia is present.  Musculoskeletal:        General: Normal range of motion.     Cervical back: Normal range of motion and neck supple.  Skin:    General: Skin is warm and dry.     Capillary Refill: Capillary refill takes less than 2 seconds.  Neurological:     General: No focal deficit present.     Mental Status: She is alert and oriented to person, place, and time.  Psychiatric:        Mood and Affect: Mood normal.        Behavior: Behavior normal.           Assessment & Plan:

## 2020-04-10 NOTE — Assessment & Plan Note (Addendum)
Doing well on omeprazole 40mg  daily for over 1 year.  Recommended to discuss this with GI regarding reducing this dose to 20mg  with goal to wean off.   Agree with assessment and plan. , NP

## 2020-04-10 NOTE — Assessment & Plan Note (Addendum)
Immunizations: UTD  Flu: Completed today  Pap smear: UTD Mammogram: Scheduled for Dec 2021 Colonoscopy: UTD, due 2023 Encouraged regular diet and exercise.  Exam today unremarkable.  Labs pending: Will schedule fasting labs   Agree with assessment and plan. Doreene Nest, NP

## 2020-04-10 NOTE — Assessment & Plan Note (Signed)
Encouraged a healthy diet and regular exercise. Repeat lipids pending.  

## 2020-04-10 NOTE — Patient Instructions (Addendum)
Schedule a lab appointment. I will notify you of your results once received.   Ensure you are consuming 64 ounces of water daily.  Start exercising. You should be getting 150 minutes of moderate intensity exercise weekly.  It was a pleasure to see you today!   Preventive Care 6-59 Years Old, Female Preventive care refers to visits with your health care provider and lifestyle choices that can promote health and wellness. This includes:  A yearly physical exam. This may also be called an annual well check.  Regular dental visits and eye exams.  Immunizations.  Screening for certain conditions.  Healthy lifestyle choices, such as eating a healthy diet, getting regular exercise, not using drugs or products that contain nicotine and tobacco, and limiting alcohol use. What can I expect for my preventive care visit? Physical exam Your health care provider will check your:  Height and weight. This may be used to calculate body mass index (BMI), which tells if you are at a healthy weight.  Heart rate and blood pressure.  Skin for abnormal spots. Counseling Your health care provider may ask you questions about your:  Alcohol, tobacco, and drug use.  Emotional well-being.  Home and relationship well-being.  Sexual activity.  Eating habits.  Work and work Statistician.  Method of birth control.  Menstrual cycle.  Pregnancy history. What immunizations do I need?  Influenza (flu) vaccine  This is recommended every year. Tetanus, diphtheria, and pertussis (Tdap) vaccine  You may need a Td booster every 10 years. Varicella (chickenpox) vaccine  You may need this if you have not been vaccinated. Zoster (shingles) vaccine  You may need this after age 75. Measles, mumps, and rubella (MMR) vaccine  You may need at least one dose of MMR if you were born in 1957 or later. You may also need a second dose. Pneumococcal conjugate (PCV13) vaccine  You may need this if you  have certain conditions and were not previously vaccinated. Pneumococcal polysaccharide (PPSV23) vaccine  You may need one or two doses if you smoke cigarettes or if you have certain conditions. Meningococcal conjugate (MenACWY) vaccine  You may need this if you have certain conditions. Hepatitis A vaccine  You may need this if you have certain conditions or if you travel or work in places where you may be exposed to hepatitis A. Hepatitis B vaccine  You may need this if you have certain conditions or if you travel or work in places where you may be exposed to hepatitis B. Haemophilus influenzae type b (Hib) vaccine  You may need this if you have certain conditions. Human papillomavirus (HPV) vaccine  If recommended by your health care provider, you may need three doses over 6 months. You may receive vaccines as individual doses or as more than one vaccine together in one shot (combination vaccines). Talk with your health care provider about the risks and benefits of combination vaccines. What tests do I need? Blood tests  Lipid and cholesterol levels. These may be checked every 5 years, or more frequently if you are over 45 years old.  Hepatitis C test.  Hepatitis B test. Screening  Lung cancer screening. You may have this screening every year starting at age 36 if you have a 30-pack-year history of smoking and currently smoke or have quit within the past 15 years.  Colorectal cancer screening. All adults should have this screening starting at age 104 and continuing until age 6. Your health care provider may recommend screening at age  45 if you are at increased risk. You will have tests every 1-10 years, depending on your results and the type of screening test.  Diabetes screening. This is done by checking your blood sugar (glucose) after you have not eaten for a while (fasting). You may have this done every 1-3 years.  Mammogram. This may be done every 1-2 years. Talk with your  health care provider about when you should start having regular mammograms. This may depend on whether you have a family history of breast cancer.  BRCA-related cancer screening. This may be done if you have a family history of breast, ovarian, tubal, or peritoneal cancers.  Pelvic exam and Pap test. This may be done every 3 years starting at age 62. Starting at age 58, this may be done every 5 years if you have a Pap test in combination with an HPV test. Other tests  Sexually transmitted disease (STD) testing.  Bone density scan. This is done to screen for osteoporosis. You may have this scan if you are at high risk for osteoporosis. Follow these instructions at home: Eating and drinking  Eat a diet that includes fresh fruits and vegetables, whole grains, lean protein, and low-fat dairy.  Take vitamin and mineral supplements as recommended by your health care provider.  Do not drink alcohol if: ? Your health care provider tells you not to drink. ? You are pregnant, may be pregnant, or are planning to become pregnant.  If you drink alcohol: ? Limit how much you have to 0-1 drink a day. ? Be aware of how much alcohol is in your drink. In the [place] one drink equals one 12 oz bottle of beer (355 mL), one 5 oz glass of wine (148 mL), or one 1 oz glass of hard liquor (44 mL). Lifestyle  Take daily care of your teeth and gums.  Stay active. Exercise for at least 30 minutes on 5 or more days each week.  Do not use any products that contain nicotine or tobacco, such as cigarettes, e-cigarettes, and chewing tobacco. If you need help quitting, ask your health care provider.  If you are sexually active, practice safe sex. Use a condom or other form of birth control (contraception) in order to prevent pregnancy and STIs (sexually transmitted infections).  If told by your health care provider, take low-dose aspirin daily starting at age 16. What's next?  Visit your health care provider once  a year for a well check visit.  Ask your health care provider how often you should have your eyes and teeth checked.  Stay up to date on all vaccines. This information is not intended to replace advice given to you by your health care provider. Make sure you discuss any questions you have with your health care provider. Document Revised: 03/15/2018 Document Reviewed: 03/15/2018 Elsevier Patient Education  [place].   Influenza (Flu) Vaccine (Inactivated or Recombinant): What You Need to Know 1. Why get vaccinated? Influenza vaccine can prevent influenza (flu). Flu is a contagious disease that spreads around the [place] every year, usually between October and May. Anyone can get the flu, but it is more dangerous for some people. Infants and young children, people 46 years of age and older, pregnant women, and people with certain health conditions or a weakened immune system are at greatest risk of flu complications. Pneumonia, bronchitis, sinus infections and ear infections are examples of flu-related complications. If you have a medical condition, such as heart disease, cancer or  diabetes, flu can make it worse. Flu can cause fever and chills, sore throat, muscle aches, fatigue, cough, headache, and runny or stuffy nose. Some people may have vomiting and diarrhea, though this is more common in children than adults. Each year thousands of people in the [place] die from flu, and many more are hospitalized. Flu vaccine prevents millions of illnesses and flu-related visits to the doctor each year. 2. Influenza vaccine CDC recommends everyone 9 months of age and older get vaccinated every flu season. Children 6 months through 80 years of age may need 2 doses during a single flu season. Everyone else needs only 1 dose each flu season. It takes about 2 weeks for protection to develop after vaccination. There are many flu viruses, and they are always changing. Each year a new flu  vaccine is made to protect against three or four viruses that are likely to cause disease in the upcoming flu season. Even when the vaccine doesn't exactly match these viruses, it may still provide some protection. Influenza vaccine does not cause flu. Influenza vaccine may be given at the same time as other vaccines. 3. Talk with your health care provider Tell your vaccine provider if the person getting the vaccine:  Has had an allergic reaction after a previous dose of influenza vaccine, or has any severe, life-threatening allergies.  Has ever had Guillain-Barr Syndrome (also called GBS). In some cases, your health care provider may decide to postpone influenza vaccination to a future visit. People with minor illnesses, such as a cold, may be vaccinated. People who are moderately or severely ill should usually wait until they recover before getting influenza vaccine. Your health care provider can give you more information. 4. Risks of a vaccine reaction  Soreness, redness, and swelling where shot is given, fever, muscle aches, and headache can happen after influenza vaccine.  There may be a very small increased risk of Guillain-Barr Syndrome (GBS) after inactivated influenza vaccine (the flu shot). Young children who get the flu shot along with pneumococcal vaccine (PCV13), and/or DTaP vaccine at the same time might be slightly more likely to have a seizure caused by fever. Tell your health care provider if a child who is getting flu vaccine has ever had a seizure. People sometimes faint after medical procedures, including vaccination. Tell your provider if you feel dizzy or have vision changes or ringing in the ears. As with any medicine, there is a very remote chance of a vaccine causing a severe allergic reaction, other serious injury, or death. 5. What if there is a serious problem? An allergic reaction could occur after the vaccinated person leaves the clinic. If you see signs of a  severe allergic reaction (hives, swelling of the face and throat, difficulty breathing, a fast heartbeat, dizziness, or weakness), call 9-1-1 and get the person to the nearest hospital. For other signs that concern you, call your health care provider. Adverse reactions should be reported to the Vaccine Adverse Event Reporting System (VAERS). Your health care provider will usually file this report, or you can do it yourself. Visit the VAERS website at www.vaers.SamedayNews.es or call (862)465-9385.VAERS is only for reporting reactions, and VAERS staff do not give medical advice. 6. The National Vaccine Injury Compensation Program The [employer] Vaccine Injury Compensation Program (VICP) is a federal program that was created to compensate people who may have been injured by certain vaccines. Visit the VICP website at GoldCloset.com.ee or call 458-105-6408 to learn about the program and about filing  a claim. There is a time limit to file a claim for compensation. 7. How can I learn more?  Ask your healthcare provider.  Call your local or state health department.  Contact the Centers for Disease Control and Prevention (CDC): ? Call 601 273 7973 (1-800-CDC-INFO) or ? Visit CDC's https://gibson.com/ Vaccine Information Statement (Interim) Inactivated Influenza Vaccine (03/01/2018) This information is not intended to replace advice given to you by your health care provider. Make sure you discuss any questions you have with your health care provider. Document Revised: 10/23/2018 Document Reviewed: 03/05/2018 Elsevier Patient Education  [place].

## 2020-04-14 ENCOUNTER — Other Ambulatory Visit: Payer: Self-pay

## 2020-04-14 ENCOUNTER — Other Ambulatory Visit (INDEPENDENT_AMBULATORY_CARE_PROVIDER_SITE_OTHER): Payer: BC Managed Care – PPO

## 2020-04-14 DIAGNOSIS — Z114 Encounter for screening for human immunodeficiency virus [HIV]: Secondary | ICD-10-CM

## 2020-04-14 DIAGNOSIS — Z0000 Encounter for general adult medical examination without abnormal findings: Secondary | ICD-10-CM | POA: Diagnosis not present

## 2020-04-14 LAB — COMPREHENSIVE METABOLIC PANEL
ALT: 12 U/L (ref 0–35)
AST: 17 U/L (ref 0–37)
Albumin: 4.1 g/dL (ref 3.5–5.2)
Alkaline Phosphatase: 70 U/L (ref 39–117)
BUN: 22 mg/dL (ref 6–23)
CO2: 30 mEq/L (ref 19–32)
Calcium: 9.4 mg/dL (ref 8.4–10.5)
Chloride: 103 mEq/L (ref 96–112)
Creatinine, Ser: 0.92 mg/dL (ref 0.40–1.20)
GFR: 62.44 mL/min (ref >60.00)
Glucose, Bld: 113 mg/dL — ABNORMAL HIGH (ref 70–99)
Potassium: 3.6 mEq/L (ref 3.5–5.1)
Sodium: 139 mEq/L (ref 135–145)
Total Bilirubin: 0.7 mg/dL (ref 0.2–1.2)
Total Protein: 6.9 g/dL (ref 6.0–8.3)

## 2020-04-14 LAB — LIPID PANEL
Cholesterol: 171 mg/dL (ref 0–200)
HDL: 50.7 mg/dL (ref >39.00)
LDL Cholesterol: 93 mg/dL (ref 0–99)
NonHDL: 119.95
Total CHOL/HDL Ratio: 3
Triglycerides: 133 mg/dL (ref 0.0–149.0)
VLDL: 26.6 mg/dL (ref 0.0–40.0)

## 2020-04-14 LAB — HEMOGLOBIN A1C: Hgb A1c MFr Bld: 6.2 % (ref 4.6–6.5)

## 2020-04-15 LAB — HIV ANTIBODY (ROUTINE TESTING W REFLEX): HIV 1&2 Ab, 4th Generation: NONREACTIVE

## 2020-04-16 ENCOUNTER — Ambulatory Visit: Payer: Self-pay

## 2020-05-07 DIAGNOSIS — K219 Gastro-esophageal reflux disease without esophagitis: Secondary | ICD-10-CM | POA: Diagnosis not present

## 2020-05-07 DIAGNOSIS — E669 Obesity, unspecified: Secondary | ICD-10-CM | POA: Diagnosis not present

## 2020-05-07 DIAGNOSIS — Z86010 Personal history of colonic polyps: Secondary | ICD-10-CM | POA: Diagnosis not present

## 2020-05-07 DIAGNOSIS — K5904 Chronic idiopathic constipation: Secondary | ICD-10-CM | POA: Diagnosis not present

## 2020-05-27 DIAGNOSIS — Z79899 Other long term (current) drug therapy: Secondary | ICD-10-CM | POA: Diagnosis not present

## 2020-05-27 DIAGNOSIS — L900 Lichen sclerosus et atrophicus: Secondary | ICD-10-CM | POA: Diagnosis not present

## 2020-06-12 DIAGNOSIS — Z1152 Encounter for screening for COVID-19: Secondary | ICD-10-CM | POA: Diagnosis not present

## 2020-06-12 DIAGNOSIS — Z20822 Contact with and (suspected) exposure to covid-19: Secondary | ICD-10-CM | POA: Diagnosis not present

## 2020-06-12 DIAGNOSIS — Z03818 Encounter for observation for suspected exposure to other biological agents ruled out: Secondary | ICD-10-CM | POA: Diagnosis not present

## 2020-06-19 ENCOUNTER — Ambulatory Visit
Admission: EM | Admit: 2020-06-19 | Discharge: 2020-06-19 | Disposition: A | Discharge status: Home / Self Care | Payer: BC Managed Care – PPO | Attending: Emergency Medicine | Admitting: Emergency Medicine

## 2020-06-19 ENCOUNTER — Other Ambulatory Visit: Payer: Self-pay

## 2020-06-19 DIAGNOSIS — N390 Urinary tract infection, site not specified: Secondary | ICD-10-CM | POA: Diagnosis not present

## 2020-06-19 LAB — POCT URINALYSIS DIP (MANUAL ENTRY)
Bilirubin, UA: NEGATIVE
Blood, UA: NEGATIVE
Glucose, UA: NEGATIVE mg/dL
Ketones, POC UA: NEGATIVE mg/dL
Nitrite, UA: NEGATIVE
Protein Ur, POC: NEGATIVE mg/dL
Spec Grav, UA: 1.025 (ref 1.010–1.025)
Urobilinogen, UA: 0.2 E.U./dL
pH, UA: 6.5 (ref 5.0–8.0)

## 2020-06-19 MED ORDER — CEPHALEXIN 500 MG PO CAPS: 500.0000 mg | ORAL_CAPSULE | Freq: Two times a day (BID) | ORAL | 0 refills | Status: AC

## 2020-06-19 NOTE — ED Triage Notes (Signed)
Pt presents to Urgent Care with c/o dysuria and frequency x 1 week. She has been taking AZO w/ some relief, but symptoms have worsened today.

## 2020-06-19 NOTE — Discharge Instructions (Signed)
Take the antibiotic as directed.  The urine culture is pending.  We will call you if it shows the need to change or discontinue your antibiotic.    Follow up with your primary care provider if your symptoms are not improving.    

## 2020-06-19 NOTE — ED Provider Notes (Signed)
Renaldo Fiddler    CSN: [id number] Arrival date & time: 06/19/20  1315      History   Chief Complaint Chief Complaint  Patient presents with   Dysuria    HPI Raven Romero is a 59 y.o. female.   Patient presents with 1 week history of dysuria, frequency, malodorous urine.  Treatment attempted at home with Azo.  She denies fever, chills, abdominal pain, vaginal discharge, pelvic pain, back pain, or other symptoms.  Her medical history includes dysuria, kidney stones, hypertension, hyperglycemia, GERD.  The history is provided by the patient and medical records.    Past Medical History:  Diagnosis Date   Breast mass, right    Cervical polyp 04/09/15   2 small polyps on ultrasound   Hypertension    Kidney stones    Lichen sclerosus    Long-term use of Plaquenil 06/06/2017   Morphea    Shingles     Patient Active Problem List   Diagnosis Date Noted   Kidney stone 04/10/2020   Preventative health care 04/10/2019   Morphea 04/10/2019   GERD (gastroesophageal reflux disease) 04/10/2019   Dysuria 04/10/2019   Hyperglycemia 05/03/2018   Lichen sclerosus 12/12/2017   Age-related nuclear cataract of both eyes 06/06/2017   Hypertensive retinopathy of both eyes 06/06/2017   Cervical spondylosis 01/16/2017   Dyslipidemia (high LDL; low HDL) 12/10/2014   Essential hypertension 12/10/2014   Mixed incontinence 10/26/2012    Past Surgical History:  Procedure Laterality Date   BREAST EXCISIONAL BIOPSY Right 06/2015   BREAST LUMPECTOMY WITH RADIOACTIVE SEED LOCALIZATION Right 06/25/2015   Procedure: RIGHT BREAST SEED LOCALIZATION LUMPECTOMY ;  Surgeon: Harriette Bouillon, MD;  Location: [facility];  Service: General;  Laterality: Right;   CYSTECTOMY  1999   ovarian cystectomy   EXTRACORPOREAL SHOCK WAVE LITHOTRIPSY Left 06/17/2019   Procedure: EXTRACORPOREAL SHOCK WAVE LITHOTRIPSY (ESWL);  Surgeon: Heloise Purpura, MD;  Location: [facility];  Service: Urology;  Laterality: Left;   NASAL SINUS SURGERY     TUBAL LIGATION     BTSP    OB History    Gravida  1   Para  1   Term  1   Preterm      AB      Living  1     SAB      TAB      Ectopic      Multiple      Live Births  1            Home Medications    Prior to Admission medications   Medication Sig Start Date End Date Taking? Authorizing Provider  phenazopyridine (PYRIDIUM) 95 MG tablet Take 95 mg by mouth 3 (three) times daily as needed for pain.   Yes [provider]  cephALEXin (KEFLEX) 500 MG capsule Take 1 capsule (500 mg total) by mouth 2 (two) times daily for 5 days. 06/19/20 06/24/20  Mickie Bail, NP  Cholecalciferol (VITAMIN D3) 125 MCG (5000 UT) CAPS Take 1 capsule by mouth daily.    [provider]  estradiol (ESTRACE) 0.1 MG/GM vaginal cream Use 1/2 g vaginally every night for the first 2 weeks, then use 1/2 g vaginally two or three times per week as needed to maintain symptom relief. 01/09/20   [name], MD  fluticasone (FLONASE) 50 MCG/ACT nasal spray Place 2 sprays into both nostrils as needed. 11/28/19   [provider]  hydrochlorothiazide (HYDRODIURIL)  25 MG tablet daily.  10/09/12   [provider]  olmesartan (BENICAR) 40 MG tablet Take 40 mg by mouth daily.    [provider]  omeprazole (PRILOSEC) 40 MG capsule Take 40 mg by mouth daily.  04/05/16   [provider]  triamcinolone ointment (KENALOG) 0.1 % triamcinolone ointment 0.1% to the vulva in a thin layer bid x 2 weeks for a flare and twice weekly for maintenance. 02/17/20   Amundson Illene Regulus, Brook E, MD  TRULANCE 3 MG TABS Take 1 tablet by mouth daily as needed.  05/02/19   [provider]    Family History Family History  Problem Relation Age of Onset   Hypertension Mother    Breast cancer Mother    Heart disease Mother    Diabetes Paternal Grandmother    Diabetes Paternal  Grandfather    Diabetes Father    Heart disease Father     Social History Social History   Tobacco Use   Smoking status: Never Smoker   Smokeless tobacco: Never Used  Building services engineer Use: Never used  Substance Use Topics   Alcohol use: Yes    Alcohol/week: 6.0 standard drinks    Types: 6 Standard drinks or equivalent per week    Comment: weekly   Drug use: No     Allergies   Flagyl [metronidazole], Macrobid  [nitrofurantoin], and Other   Review of Systems Review of Systems  Constitutional: Negative for chills and fever.  HENT: Negative for ear pain and sore throat.   Eyes: Negative for pain and visual disturbance.  Respiratory: Negative for cough and shortness of breath.   Cardiovascular: Negative for chest pain and palpitations.  Gastrointestinal: Negative for abdominal pain and vomiting.  Genitourinary: Positive for dysuria and frequency. Negative for flank pain, hematuria, pelvic pain and vaginal discharge.  Musculoskeletal: Negative for arthralgias and back pain.  Skin: Negative for color change and rash.  Neurological: Negative for seizures and syncope.  All other systems reviewed and are negative.    Physical Exam Triage Vital Signs ED Triage Vitals  Enc Vitals Group     BP      Pulse      Resp      Temp      Temp src      SpO2      Weight      Height      Head Circumference      Peak Flow      Pain Score      Pain Loc      Pain Edu?      Excl. in GC?    No data found.  Updated Vital Signs BP 128/86 (BP Location: Right Arm)    Pulse 80    Temp 98.5 F (36.9 C) (Oral)    Resp 18    Wt 180 lb (81.6 kg)    LMP 08/18/2013    SpO2 98%    BMI 31.89 kg/m   Visual Acuity Right Eye Distance:   Left Eye Distance:   Bilateral Distance:    Right Eye Near:   Left Eye Near:    Bilateral Near:     Physical Exam Vitals and nursing note reviewed.  Constitutional:      General: She is not in acute distress.    Appearance: She is  well-developed. She is not ill-appearing.  HENT:     Head: Normocephalic and atraumatic.     Mouth/Throat:  Mouth: Mucous membranes are moist.     Pharynx: Oropharynx is clear.  Eyes:     Conjunctiva/sclera: Conjunctivae normal.  Cardiovascular:     Rate and Rhythm: Normal rate and regular rhythm.     Heart sounds: No murmur heard.   Pulmonary:     Effort: Pulmonary effort is normal. No respiratory distress.     Breath sounds: Normal breath sounds.  Abdominal:     Palpations: Abdomen is soft.     Tenderness: There is no abdominal tenderness. There is no right CVA tenderness, left CVA tenderness, guarding or rebound.  Musculoskeletal:     Cervical back: Neck supple.  Skin:    General: Skin is warm and dry.     Findings: No rash.  Neurological:     General: No focal deficit present.     Mental Status: She is alert and oriented to person, place, and time.     Gait: Gait normal.  Psychiatric:        Mood and Affect: Mood normal.        Behavior: Behavior normal.      UC Treatments / Results  Labs (all labs ordered are listed, but only abnormal results are displayed) Labs Reviewed  POCT URINALYSIS DIP (MANUAL ENTRY) - Abnormal; Notable for the following components:      Result Value   Leukocytes, UA Small (1+) (*)    All other components within normal limits  URINE CULTURE    EKG   Radiology No results found.  Procedures Procedures (including critical care time)  Medications Ordered in UC Medications - No data to display  Initial Impression / Assessment and Plan / UC Course  I have reviewed the triage vital signs and the nursing notes.  Pertinent labs & imaging results that were available during my care of the patient were reviewed by me and considered in my medical decision making (see chart for details).   Urinary tract infection.  Urine culture pending.  Treating with cephalexin.  Discussed with patient we will call her if the culture shows the need to  change or discontinue her antibiotic.  Instructed her to follow-up with her PCP if her symptoms are not improving.  Patient agrees to plan of care.   Final Clinical Impressions(s) / UC Diagnoses   Final diagnoses:  Urinary tract infection without hematuria, site unspecified     Discharge Instructions     Take the antibiotic as directed.  The urine culture is pending.  We will call you if it shows the need to change or discontinue your antibiotic.    Follow up with your primary care provider if your symptoms are not improving.        ED Prescriptions    Medication Sig Dispense Auth. Provider   cephALEXin (KEFLEX) 500 MG capsule Take 1 capsule (500 mg total) by mouth 2 (two) times daily for 5 days. 10 capsule Mickie Bail, NP     PDMP not reviewed this encounter.   Mickie Bail, NP 06/19/20 1355

## 2020-06-21 LAB — URINE CULTURE

## 2020-06-22 DIAGNOSIS — Z03818 Encounter for observation for suspected exposure to other biological agents ruled out: Secondary | ICD-10-CM | POA: Diagnosis not present

## 2020-06-22 DIAGNOSIS — Z20822 Contact with and (suspected) exposure to covid-19: Secondary | ICD-10-CM | POA: Diagnosis not present

## 2020-06-22 DIAGNOSIS — U071 COVID-19: Secondary | ICD-10-CM | POA: Diagnosis not present

## 2020-07-09 ENCOUNTER — Ambulatory Visit
Admission: RE | Admit: 2020-07-09 | Discharge: 2020-07-09 | Disposition: A | Discharge status: Home / Self Care | Payer: BC Managed Care – PPO | Source: Ambulatory Visit | Attending: Obstetrics and Gynecology | Admitting: Obstetrics and Gynecology

## 2020-07-09 ENCOUNTER — Other Ambulatory Visit: Payer: Self-pay

## 2020-07-09 DIAGNOSIS — Z1231 Encounter for screening mammogram for malignant neoplasm of breast: Secondary | ICD-10-CM

## 2020-07-28 DIAGNOSIS — Z86010 Personal history of colonic polyps: Secondary | ICD-10-CM | POA: Diagnosis not present

## 2020-07-28 DIAGNOSIS — K219 Gastro-esophageal reflux disease without esophagitis: Secondary | ICD-10-CM | POA: Diagnosis not present

## 2020-07-28 DIAGNOSIS — K5904 Chronic idiopathic constipation: Secondary | ICD-10-CM | POA: Diagnosis not present

## 2020-07-28 DIAGNOSIS — E669 Obesity, unspecified: Secondary | ICD-10-CM | POA: Diagnosis not present

## 2020-07-31 ENCOUNTER — Encounter: Payer: Self-pay | Admitting: Obstetrics & Gynecology

## 2020-07-31 ENCOUNTER — Other Ambulatory Visit: Payer: Self-pay

## 2020-07-31 ENCOUNTER — Ambulatory Visit (INDEPENDENT_AMBULATORY_CARE_PROVIDER_SITE_OTHER): Payer: BC Managed Care – PPO | Admitting: Obstetrics & Gynecology

## 2020-07-31 VITALS — BP 128/80

## 2020-07-31 DIAGNOSIS — Z113 Encounter for screening for infections with a predominantly sexual mode of transmission: Secondary | ICD-10-CM | POA: Diagnosis not present

## 2020-07-31 DIAGNOSIS — N898 Other specified noninflammatory disorders of vagina: Secondary | ICD-10-CM | POA: Diagnosis not present

## 2020-07-31 DIAGNOSIS — M545 Low back pain, unspecified: Secondary | ICD-10-CM

## 2020-07-31 LAB — WET PREP FOR TRICH, YEAST, CLUE

## 2020-07-31 MED ORDER — SULFAMETHOXAZOLE-TRIMETHOPRIM 800-160 MG PO TABS: 1.0000 | ORAL_TABLET | Freq: Two times a day (BID) | ORAL | 0 refills | Status: AC

## 2020-07-31 NOTE — Progress Notes (Signed)
    Raven Romero 27-Apr-1961 [id number]        60 y.o.  G1P1L1   RP: Lower back pain  HPI: UTI in December 2021 treated with Keflex.  C/O mild lower back pain/discomfort.  No urinary frequency or burning.  No vaginal d/c. C/O mild vaginal itching.  No pelvic pain.  No fever.   OB History  Gravida Para Term Preterm AB Living  1 1 1     1   SAB IAB Ectopic Multiple Live Births          1    # Outcome Date GA Lbr Len/2nd Weight Sex Delivery Anes PTL Lv  1 Term 1986 [redacted]w[redacted]d   F Vag-Spont   LIV    Past medical history,surgical history, problem list, medications, allergies, family history and social history were all reviewed and documented in the EPIC chart.   Directed ROS with pertinent positives and negatives documented in the history of present illness/assessment and plan.  Exam:  Vitals:   07/31/20 1340  BP: 128/80   General appearance:  Normal  CVAT Negative bilaterally  Abdomen: Normal  Gynecologic exam: Vulva normal.  Speculum:  Cervix/Vagina normal.  Normal vaginal secretions.  Wet prep done.  Wet prep Negative U/A: Urine yellow cloudy, protein negative, nitrites negative, white blood cells 20-40, red blood cells 0-2, bacteria many.  Urine culture pending.   Assessment/Plan:  60 y.o. G1P1001   1. Acute midline low back pain without sciatica Urine analysis compatible with an acute cystitis.  No evidence of pyelonephritis.  We will treat with Bactrim DS 1 tablet twice a day for 3 days.  Usage reviewed and prescription sent to pharmacy.  Urine culture pending, will adjust as needed. - Urinalysis,Complete w/RFL Culture  2. Vaginal itching Wet prep negative.  Patient reassured.  Other orders - sulfamethoxazole-trimethoprim (BACTRIM DS) 800-160 MG tablet; Take 1 tablet by mouth 2 (two) times daily for 3 days.  46 MD, 1:47 PM 07/31/2020

## 2020-08-01 LAB — C. TRACHOMATIS/N. GONORRHOEAE RNA
C. trachomatis RNA, TMA: NOT DETECTED
N. gonorrhoeae RNA, TMA: NOT DETECTED

## 2020-08-03 LAB — URINALYSIS, COMPLETE W/RFL CULTURE
Bilirubin Urine: NEGATIVE
Glucose, UA: NEGATIVE
Hyaline Cast: NONE SEEN /LPF
Ketones, ur: NEGATIVE
Nitrites, Initial: NEGATIVE
Protein, ur: NEGATIVE
Specific Gravity, Urine: 1.02 (ref 1.001–1.03)
pH: 6 (ref 5.0–8.0)

## 2020-08-03 LAB — URINE CULTURE
MICRO NUMBER:: 11419909
SPECIMEN QUALITY:: ADEQUATE

## 2020-08-03 LAB — CULTURE INDICATED

## 2020-08-12 ENCOUNTER — Telehealth: Payer: Self-pay | Admitting: *Deleted

## 2020-08-12 NOTE — Telephone Encounter (Signed)
Patient was seen and treated for UTI on 07/31/20 with Bactrim DS 1 tablet twice a day for 3 days. Report she felt better while on medication and after taking Rx. However her symptoms slowly returned burning with urination and lower back pain. Please advise

## 2020-08-13 MED ORDER — CIPROFLOXACIN HCL 500 MG PO TABS: 500.0000 mg | ORAL_TABLET | Freq: Two times a day (BID) | ORAL | 0 refills | Status: DC

## 2020-08-13 NOTE — Telephone Encounter (Signed)
Patient informed. Rx sent 

## 2020-08-13 NOTE — Telephone Encounter (Signed)
Please send Ciprofloxacin 500 mg BID x 7 days.

## 2020-08-26 DIAGNOSIS — Z79899 Other long term (current) drug therapy: Secondary | ICD-10-CM | POA: Diagnosis not present

## 2020-08-26 DIAGNOSIS — L900 Lichen sclerosus et atrophicus: Secondary | ICD-10-CM | POA: Diagnosis not present

## 2020-08-27 DIAGNOSIS — Z20822 Contact with and (suspected) exposure to covid-19: Secondary | ICD-10-CM | POA: Diagnosis not present

## 2020-08-27 DIAGNOSIS — Z03818 Encounter for observation for suspected exposure to other biological agents ruled out: Secondary | ICD-10-CM | POA: Diagnosis not present

## 2020-08-28 DIAGNOSIS — Z79899 Other long term (current) drug therapy: Secondary | ICD-10-CM | POA: Diagnosis not present

## 2020-08-30 DIAGNOSIS — J040 Acute laryngitis: Secondary | ICD-10-CM | POA: Diagnosis not present

## 2020-08-30 DIAGNOSIS — J0140 Acute pansinusitis, unspecified: Secondary | ICD-10-CM | POA: Diagnosis not present

## 2020-11-06 ENCOUNTER — Other Ambulatory Visit: Payer: Self-pay | Admitting: Obstetrics and Gynecology

## 2020-11-06 NOTE — Telephone Encounter (Signed)
Annual exam due in June, recent mammogram was in 12/21

## 2020-11-19 DIAGNOSIS — R350 Frequency of micturition: Secondary | ICD-10-CM | POA: Diagnosis not present

## 2020-11-19 DIAGNOSIS — N390 Urinary tract infection, site not specified: Secondary | ICD-10-CM | POA: Diagnosis not present

## 2020-11-19 DIAGNOSIS — R21 Rash and other nonspecific skin eruption: Secondary | ICD-10-CM | POA: Diagnosis not present

## 2020-11-20 ENCOUNTER — Ambulatory Visit: Payer: BC Managed Care – PPO | Admitting: Internal Medicine

## 2020-12-01 ENCOUNTER — Other Ambulatory Visit: Payer: Self-pay

## 2020-12-01 ENCOUNTER — Ambulatory Visit (INDEPENDENT_AMBULATORY_CARE_PROVIDER_SITE_OTHER): Payer: BC Managed Care – PPO | Admitting: Primary Care

## 2020-12-01 ENCOUNTER — Encounter: Payer: Self-pay | Admitting: Primary Care

## 2020-12-01 VITALS — BP 118/62 | HR 86 | Temp 98.2°F | Ht 63.0 in | Wt 185.0 lb

## 2020-12-01 DIAGNOSIS — I10 Essential (primary) hypertension: Secondary | ICD-10-CM

## 2020-12-01 DIAGNOSIS — L940 Localized scleroderma [morphea]: Secondary | ICD-10-CM

## 2020-12-01 DIAGNOSIS — L299 Pruritus, unspecified: Secondary | ICD-10-CM | POA: Diagnosis not present

## 2020-12-01 DIAGNOSIS — Z87440 Personal history of urinary (tract) infections: Secondary | ICD-10-CM

## 2020-12-01 DIAGNOSIS — R7303 Prediabetes: Secondary | ICD-10-CM | POA: Diagnosis not present

## 2020-12-01 DIAGNOSIS — R7982 Elevated C-reactive protein (CRP): Secondary | ICD-10-CM | POA: Diagnosis not present

## 2020-12-01 LAB — POC URINALSYSI DIPSTICK (AUTOMATED)
Bilirubin, UA: NEGATIVE
Blood, UA: NEGATIVE
Glucose, UA: NEGATIVE
Ketones, UA: NEGATIVE
Leukocytes, UA: NEGATIVE
Nitrite, UA: NEGATIVE
Protein, UA: NEGATIVE
Spec Grav, UA: 1.01 (ref 1.010–1.025)
Urobilinogen, UA: 0.2 E.U./dL
pH, UA: 7 (ref 5.0–8.0)

## 2020-12-01 LAB — CBC
HCT: 38.5 % (ref 36.0–46.0)
Hemoglobin: 13 g/dL (ref 12.0–15.0)
MCHC: 33.7 g/dL (ref 30.0–36.0)
MCV: 89.8 fl (ref 78.0–100.0)
Platelets: 293 10*3/uL (ref 150.0–400.0)
RBC: 4.28 Mil/uL (ref 3.87–5.11)
RDW: 13.5 % (ref 11.5–15.5)
WBC: 5 10*3/uL (ref 4.0–10.5)

## 2020-12-01 LAB — HEMOGLOBIN A1C: Hgb A1c MFr Bld: 5.9 % (ref 4.6–6.5)

## 2020-12-01 LAB — C-REACTIVE PROTEIN: CRP: <1 mg/dL (ref 0.5–20.0)

## 2020-12-01 NOTE — Assessment & Plan Note (Signed)
Following with dermatology.  Will repeat CRP today. Add CBC and A1C given glucose reading of 147 and her itching today.

## 2020-12-01 NOTE — Addendum Note (Signed)
Addended by: Doreene Nest on: 12/01/2020 10:45 AM   Modules accepted: Orders

## 2020-12-01 NOTE — Assessment & Plan Note (Signed)
Repeat A1C pending. 

## 2020-12-01 NOTE — Patient Instructions (Signed)
Stop by the lab prior to leaving today. I will notify you of your results once received.   Try certirizine (Zyrtec) 10 mg and famotidine (Pepcid) 20 mg daily for itching.  It was a pleasure to see you today!

## 2020-12-01 NOTE — Assessment & Plan Note (Signed)
Well controlled in the office today, continue olmesartan 40 mg and HCTZ 25 mg.  CMP from care everywhere reviewed.

## 2020-12-01 NOTE — Assessment & Plan Note (Signed)
No obvious rash, mostly just irritation noted from her scratching.  Discussed to stop scratching. Also discussed use of Zyrtec and Pepcid daily for itching.  She will update.

## 2020-12-01 NOTE — Progress Notes (Signed)
Subjective:    Patient ID: Raven Romero, female    DOB: [birth year], 60 y.o.   MRN: [id number]  HPI  Raven Romero is a very pleasant 60 y.o. female with a history of hypertension, GERD, renal stones, hyperlipidemia, urinary incontinence, retinopathy, lichen sclerosis who presents today to discuss multiple issues.  1) Rash: Evaluated at [facility] on 11/19/20 for bilateral upper extremity rash for 3 weeks. She was provided with an injection of Depo Medrol 80 mg, also treated with Omnicef for UTI.   Today she endorses the rash has improved some, is unsure if she's experiencing symptoms secondary to anxiety. She's been scratching a lot. She recently treated her home with a flea treatment. She's tried triamcinolone 0.1% ointment, calamine lotion and applying rubbing alcohol with temporary improvement.   She has an appointment with her dermatologist in June 2022.  2) Abnormal Labs: Evaluated by her dermatologist in February 2022 who completed CRP and CMP, the CRP showed elevation of 2.30, glucose reading of 147.    She denies a family history of rheumatology, autoimmune disease. She denies a personal history of osteoarthritis.    Review of Systems  Respiratory: Negative for shortness of breath.   Musculoskeletal: Negative for arthralgias.  Skin: Positive for rash.         Past Medical History:  Diagnosis Date  . Breast mass, right   . Cervical polyp 04/09/15   2 small polyps on ultrasound  . Hypertension   . Kidney stones   . Lichen sclerosus   . Long-term use of Plaquenil 06/06/2017  . Morphea   . Shingles     Social History   Socioeconomic History  . Marital status: Married    Spouse name: Not on file  . Number of children: Not on file  . Years of education: Not on file  . Highest education level: Not on file  Occupational History  . Not on file  Tobacco Use  . Smoking status: Never Smoker  . Smokeless tobacco: Never Used  Vaping Use  . Vaping Use: Never  used  Substance and Sexual Activity  . Alcohol use: Yes    Alcohol/week: 6.0 standard drinks    Types: 6 Standard drinks or equivalent per week    Comment: weekly  . Drug use: No  . Sexual activity: Yes    Partners: Male    Birth control/protection: Surgical    Comment: btsp  Other Topics Concern  . Not on file  Social History Narrative   Married.    1 child, 2 grandchildren.   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   Enjoys traveling to the beach.    Social Determinants of Health   Financial Resource Strain: Not on file  Food Insecurity: Not on file  Transportation Needs: Not on file  Physical Activity: Not on file  Stress: Not on file  Social Connections: Not on file  Intimate Partner Violence: Not on file    Past Surgical History:  Procedure Laterality Date  . BREAST EXCISIONAL BIOPSY Right 06/2015  . BREAST LUMPECTOMY WITH RADIOACTIVE SEED LOCALIZATION Right 06/25/2015   Procedure: RIGHT BREAST SEED LOCALIZATION LUMPECTOMY ;  Surgeon: Harriette Bouillon, MD;  Location: [facility];  Service: General;  Laterality: Right;  . CYSTECTOMY  1999   ovarian cystectomy  . EXTRACORPOREAL SHOCK WAVE LITHOTRIPSY Left 06/17/2019   Procedure: EXTRACORPOREAL SHOCK WAVE LITHOTRIPSY (ESWL);  Surgeon: Heloise Purpura, MD;  Location: [facility];  Service: Urology;  Laterality: Left;  . NASAL SINUS  SURGERY    . TUBAL LIGATION     BTSP    Family History  Problem Relation Age of Onset  . Hypertension Mother   . Breast cancer Mother   . Heart disease Mother   . Diabetes Paternal Grandmother   . Diabetes Paternal Grandfather   . Diabetes Father   . Heart disease Father     Allergies  Allergen Reactions  . Flagyl [Metronidazole] Nausea And Vomiting  . Macrobid  [Nitrofurantoin] Nausea Only  . Other     Medrol Dosepak - pt stated, "Made face feel like it was burning from the inside out; face was blood red"    Current Outpatient Medications on File Prior to Visit  Medication Sig Dispense  Refill  . estradiol (ESTRACE) 0.1 MG/GM vaginal cream USE 1/2 GRAM VAGINALLY 2-3 TIMES PER WEEK TO MAINTAIN SYMPTOM RELIEF AS DIRECTED 42.5 g 0  . fluticasone (FLONASE) 50 MCG/ACT nasal spray Place 2 sprays into both nostrils as needed.    . hydrochlorothiazide (HYDRODIURIL) 25 MG tablet daily.     Marland Kitchen olmesartan (BENICAR) 40 MG tablet Take 40 mg by mouth daily.    Marland Kitchen omeprazole (PRILOSEC) 40 MG capsule Take 40 mg by mouth daily.   1  . triamcinolone ointment (KENALOG) 0.1 % triamcinolone ointment 0.1% to the vulva in a thin layer bid x 2 weeks for a flare and twice weekly for maintenance. 30 g 1  . TRULANCE 3 MG TABS Take 1 tablet by mouth daily as needed.     . ciprofloxacin (CIPRO) 500 MG tablet Take 1 tablet (500 mg total) by mouth 2 (two) times daily. (Patient not taking: Reported on 12/01/2020) 14 tablet 0  . phenazopyridine (PYRIDIUM) 95 MG tablet Take 95 mg by mouth 3 (three) times daily as needed for pain. (Patient not taking: Reported on 12/01/2020)     No current facility-administered medications on file prior to visit.    BP 118/62   Pulse 86   Temp 98.2 F (36.8 C) (Temporal)   Ht 5\' 3"  (1.6 m)   Wt 185 lb (83.9 kg)   LMP 08/18/2013   SpO2 100%   BMI 32.77 kg/m  Objective:   Physical Exam Cardiovascular:     Rate and Rhythm: Normal rate and regular rhythm.  Pulmonary:     Effort: Pulmonary effort is normal.     Breath sounds: Normal breath sounds.  Musculoskeletal:     Cervical back: Neck supple.  Skin:    General: Skin is warm and dry.           Assessment & Plan:      This visit occurred during the SARS-CoV-2 public health emergency.  Safety protocols were in place, including screening questions prior to the visit, additional usage of staff PPE, and extensive cleaning of exam room while observing appropriate contact time as indicated for disinfecting solutions.

## 2021-01-04 HISTORY — PX: OTHER SURGICAL HISTORY: SHX169

## 2021-01-05 DIAGNOSIS — S52601A Unspecified fracture of lower end of right ulna, initial encounter for closed fracture: Secondary | ICD-10-CM | POA: Diagnosis not present

## 2021-01-07 DIAGNOSIS — N951 Menopausal and female climacteric states: Secondary | ICD-10-CM | POA: Diagnosis not present

## 2021-01-07 DIAGNOSIS — R635 Abnormal weight gain: Secondary | ICD-10-CM | POA: Diagnosis not present

## 2021-01-07 DIAGNOSIS — R739 Hyperglycemia, unspecified: Secondary | ICD-10-CM | POA: Diagnosis not present

## 2021-01-07 DIAGNOSIS — E611 Iron deficiency: Secondary | ICD-10-CM | POA: Diagnosis not present

## 2021-01-07 DIAGNOSIS — I10 Essential (primary) hypertension: Secondary | ICD-10-CM | POA: Diagnosis not present

## 2021-01-11 ENCOUNTER — Other Ambulatory Visit: Payer: Self-pay

## 2021-01-11 ENCOUNTER — Other Ambulatory Visit (HOSPITAL_COMMUNITY)
Admission: RE | Admit: 2021-01-11 | Discharge: 2021-01-11 | Disposition: A | Discharge status: Home / Self Care | Payer: BC Managed Care – PPO | Source: Ambulatory Visit | Attending: Obstetrics and Gynecology | Admitting: Obstetrics and Gynecology

## 2021-01-11 ENCOUNTER — Encounter: Payer: Self-pay | Admitting: Obstetrics and Gynecology

## 2021-01-11 ENCOUNTER — Ambulatory Visit (INDEPENDENT_AMBULATORY_CARE_PROVIDER_SITE_OTHER): Payer: BC Managed Care – PPO | Admitting: Obstetrics and Gynecology

## 2021-01-11 VITALS — BP 118/82 | HR 106 | Ht 60.0 in | Wt 185.0 lb

## 2021-01-11 DIAGNOSIS — Z01419 Encounter for gynecological examination (general) (routine) without abnormal findings: Secondary | ICD-10-CM

## 2021-01-11 DIAGNOSIS — N950 Postmenopausal bleeding: Secondary | ICD-10-CM

## 2021-01-11 DIAGNOSIS — R739 Hyperglycemia, unspecified: Secondary | ICD-10-CM | POA: Diagnosis not present

## 2021-01-11 DIAGNOSIS — E611 Iron deficiency: Secondary | ICD-10-CM | POA: Diagnosis not present

## 2021-01-11 DIAGNOSIS — R635 Abnormal weight gain: Secondary | ICD-10-CM | POA: Diagnosis not present

## 2021-01-11 DIAGNOSIS — Z1339 Encounter for screening examination for other mental health and behavioral disorders: Secondary | ICD-10-CM | POA: Diagnosis not present

## 2021-01-11 DIAGNOSIS — Z124 Encounter for screening for malignant neoplasm of cervix: Secondary | ICD-10-CM

## 2021-01-11 DIAGNOSIS — I10 Essential (primary) hypertension: Secondary | ICD-10-CM | POA: Diagnosis not present

## 2021-01-11 DIAGNOSIS — Z1331 Encounter for screening for depression: Secondary | ICD-10-CM | POA: Diagnosis not present

## 2021-01-11 MED ORDER — TRIAMCINOLONE ACETONIDE 0.1 % EX OINT: TOPICAL_OINTMENT | CUTANEOUS | 1 refills | Status: DC

## 2021-01-11 MED ORDER — ESTRADIOL 0.1 MG/GM VA CREA: TOPICAL_CREAM | VAGINAL | 1 refills | Status: DC

## 2021-01-11 NOTE — Patient Instructions (Signed)

## 2021-01-11 NOTE — Progress Notes (Signed)
60 y.o. G47P1001 Married Caucasian female here for annual exam.    Has vaginal dryness.  Wants to continue Estradiol cream.   One episode of bleeding but not sure if it was related to sex.  Has had this in the past.  No prior pelvic US.   Needs refill of Triamcinolone ointment.   Broke her arm.  Casted one week ago.  Father passed 11/2020 and dog passed 08/2020. Father had CHF.   Vaccinated against Covid.  No booster.   Labs with PCP.   PCP: Vernona Rieger, NP  Patient's last menstrual period was 08/18/2013.           Sexually active: Yes.    The current method of family planning is tubal ligation.    Exercising: No.  The patient does not participate in regular exercise at present. Does some walking Smoker:  no  Health Maintenance: Pap:  01-04-19 Neg:Neg HR HPV, 11-14-17 neg, 11-10-16 Neg:Neg HR HPV  History of abnormal Pap:  no MMG: 07-09-20 3D/Neg/BiRads1 Colonoscopy: 2018 Neg;next 2028 BMD:   n/a  Result  n/a TDaP:  2013 Gardasil:   no HIV:Neg in past Hep C:04-03-19 Neg Screening Labs:  PCP.   reports that she has never smoked. She has never used smokeless tobacco. She reports current alcohol use of about 6.0 standard drinks of alcohol per week. She reports that she does not use drugs.  Past Medical History:  Diagnosis Date   Breast mass, right    Cervical polyp 04/09/15   2 small polyps on ultrasound   Hypertension    Kidney stones    Lichen sclerosus    Long-term use of Plaquenil 06/06/2017   Morphea    Shingles     Past Surgical History:  Procedure Laterality Date   BREAST EXCISIONAL BIOPSY Right 06/2015   BREAST LUMPECTOMY WITH RADIOACTIVE SEED LOCALIZATION Right 06/25/2015   Procedure: RIGHT BREAST SEED LOCALIZATION LUMPECTOMY ;  Surgeon: Harriette Bouillon, MD;  Location: [facility];  Service: General;  Laterality: Right;   CYSTECTOMY  1999   ovarian cystectomy   EXTRACORPOREAL SHOCK WAVE LITHOTRIPSY Left 06/17/2019   Procedure:  EXTRACORPOREAL SHOCK WAVE LITHOTRIPSY (ESWL);  Surgeon: Heloise Purpura, MD;  Location: [facility];  Service: Urology;  Laterality: Left;   fractured arm Right 01/04/2021   NASAL SINUS SURGERY     TUBAL LIGATION     BTSP    Current Outpatient Medications  Medication Sig Dispense Refill   desonide (DESOWEN) 0.05 % cream desonide 0.05 % topical cream     estradiol (ESTRACE) 0.1 MG/GM vaginal cream USE 1/2 GRAM VAGINALLY 2-3 TIMES PER WEEK TO MAINTAIN SYMPTOM RELIEF AS DIRECTED 42.5 g 0   fluticasone (FLONASE) 50 MCG/ACT nasal spray Place 2 sprays into both nostrils as needed.     hydrochlorothiazide (HYDRODIURIL) 25 MG tablet daily.      hydroxychloroquine (PLAQUENIL) 200 MG tablet Take 200 mg by mouth in the morning and at bedtime.     olmesartan (BENICAR) 40 MG tablet Take 40 mg by mouth daily.     omeprazole (PRILOSEC) 40 MG capsule Take 40 mg by mouth daily.   1   phenazopyridine (PYRIDIUM) 95 MG tablet Take 95 mg by mouth 3 (three) times daily as needed for pain.     triamcinolone ointment (KENALOG) 0.1 % triamcinolone ointment 0.1% to the vulva in a thin layer bid x 2 weeks for a flare and twice weekly for maintenance. 30 g 1   TRULANCE 3 MG TABS  Take 1 tablet by mouth daily as needed.      No current facility-administered medications for this visit.    Family History  Problem Relation Age of Onset   Hypertension Mother    Breast cancer Mother    Heart disease Mother    Diabetes Father    Heart disease Father    Diabetes Paternal Grandmother    Diabetes Paternal Grandfather     Review of Systems  All other systems reviewed and are negative.  Exam:   BP 118/82 (Cuff Size: Large)   Pulse (!) 106   Ht 5' (1.524 m)   Wt 185 lb (83.9 kg)   LMP 08/18/2013   SpO2 99%   BMI 36.13 kg/m     General appearance: alert, cooperative and appears stated age Head: normocephalic, without obvious abnormality, atraumatic Neck: no adenopathy, supple, symmetrical, trachea midline and thyroid  normal to inspection and palpation Lungs: clear to auscultation bilaterally Breasts: normal appearance, no masses or tenderness, No nipple retraction or dimpling, No nipple discharge or bleeding, No axillary adenopathy Heart: regular rate and rhythm Abdomen: soft, non-tender; no masses, no organomegaly Extremities: extremities normal, atraumatic, no cyanosis or edema Skin: skin color, texture, turgor normal. Areas of hyperpigmentation.  Lymph nodes: cervical, supraclavicular, and axillary nodes normal. Neurologic: grossly normal  Pelvic: External genitalia:  no lesions              No abnormal inguinal nodes palpated.              Urethra:  normal appearing urethra with no masses, tenderness or lesions              Bartholins and Skenes: normal                 Vagina: normal appearing vagina with normal color and discharge, no lesions              Cervix: no lesions.  Bleeds with pap.              Pap taken: yes. Bimanual Exam:  Uterus:  normal size, contour, position, consistency, mobility, non-tender              Adnexa: no mass, fullness, tenderness              Rectal exam: yes.  Confirms.              Anus:  normal sphincter tone, no lesions  Chaperone was present for exam.  Assessment:   Well woman visit with normal exam. Postmenopausal bleeding.  Lichen sclerosus. FH breast cancer. Morphea.  Plan: Mammogram screening discussed. Self breast awareness reviewed. Pap and HR HPV as above. Guidelines for Calcium, Vitamin D, regular exercise program including cardiovascular and weight bearing exercise. Rx for Estrace cream.  I discussed potential effect on breast cancer.  Refill of Triamcinolone ointment.   Return for pelvic US and possible EMB. Rationale explained to rule out polyps, cysts, cancer and precancer.  Follow up annually and prn.   After visit summary provided.

## 2021-01-12 DIAGNOSIS — L940 Localized scleroderma [morphea]: Secondary | ICD-10-CM | POA: Diagnosis not present

## 2021-01-12 DIAGNOSIS — Z5181 Encounter for therapeutic drug level monitoring: Secondary | ICD-10-CM | POA: Diagnosis not present

## 2021-01-12 DIAGNOSIS — L299 Pruritus, unspecified: Secondary | ICD-10-CM | POA: Diagnosis not present

## 2021-01-12 DIAGNOSIS — S52601D Unspecified fracture of lower end of right ulna, subsequent encounter for closed fracture with routine healing: Secondary | ICD-10-CM | POA: Diagnosis not present

## 2021-01-13 LAB — CYTOLOGY - PAP
Comment: NEGATIVE
Diagnosis: NEGATIVE
High risk HPV: NEGATIVE

## 2021-01-19 DIAGNOSIS — S52601D Unspecified fracture of lower end of right ulna, subsequent encounter for closed fracture with routine healing: Secondary | ICD-10-CM | POA: Diagnosis not present

## 2021-01-21 DIAGNOSIS — I10 Essential (primary) hypertension: Secondary | ICD-10-CM | POA: Diagnosis not present

## 2021-01-21 DIAGNOSIS — Z6832 Body mass index (BMI) 32.0-32.9, adult: Secondary | ICD-10-CM | POA: Diagnosis not present

## 2021-01-25 DIAGNOSIS — I10 Essential (primary) hypertension: Secondary | ICD-10-CM

## 2021-01-27 MED ORDER — HYDROCHLOROTHIAZIDE 25 MG PO TABS: 25.0000 mg | ORAL_TABLET | Freq: Every day | ORAL | 2 refills | Status: DC

## 2021-01-27 MED ORDER — OLMESARTAN MEDOXOMIL 40 MG PO TABS: 40.0000 mg | ORAL_TABLET | Freq: Every day | ORAL | 2 refills | Status: DC

## 2021-02-09 DIAGNOSIS — S52601D Unspecified fracture of lower end of right ulna, subsequent encounter for closed fracture with routine healing: Secondary | ICD-10-CM | POA: Diagnosis not present

## 2021-02-12 DIAGNOSIS — S52601D Unspecified fracture of lower end of right ulna, subsequent encounter for closed fracture with routine healing: Secondary | ICD-10-CM | POA: Diagnosis not present

## 2021-02-12 DIAGNOSIS — M25531 Pain in right wrist: Secondary | ICD-10-CM | POA: Diagnosis not present

## 2021-02-12 DIAGNOSIS — G8918 Other acute postprocedural pain: Secondary | ICD-10-CM | POA: Diagnosis not present

## 2021-02-12 DIAGNOSIS — S52691A Other fracture of lower end of right ulna, initial encounter for closed fracture: Secondary | ICD-10-CM | POA: Diagnosis not present

## 2021-02-12 DIAGNOSIS — S52201A Unspecified fracture of shaft of right ulna, initial encounter for closed fracture: Secondary | ICD-10-CM | POA: Diagnosis not present

## 2021-02-15 DIAGNOSIS — S52691D Other fracture of lower end of right ulna, subsequent encounter for closed fracture with routine healing: Secondary | ICD-10-CM | POA: Diagnosis not present

## 2021-02-25 DIAGNOSIS — Z09 Encounter for follow-up examination after completed treatment for conditions other than malignant neoplasm: Secondary | ICD-10-CM | POA: Diagnosis not present

## 2021-02-26 DIAGNOSIS — S52691D Other fracture of lower end of right ulna, subsequent encounter for closed fracture with routine healing: Secondary | ICD-10-CM | POA: Diagnosis not present

## 2021-03-03 DIAGNOSIS — S52691D Other fracture of lower end of right ulna, subsequent encounter for closed fracture with routine healing: Secondary | ICD-10-CM | POA: Diagnosis not present

## 2021-03-09 ENCOUNTER — Other Ambulatory Visit: Payer: BC Managed Care – PPO | Admitting: Obstetrics and Gynecology

## 2021-03-09 ENCOUNTER — Other Ambulatory Visit: Payer: BC Managed Care – PPO

## 2021-03-10 DIAGNOSIS — S52691D Other fracture of lower end of right ulna, subsequent encounter for closed fracture with routine healing: Secondary | ICD-10-CM | POA: Diagnosis not present

## 2021-03-15 ENCOUNTER — Telehealth: Payer: Self-pay | Admitting: Obstetrics and Gynecology

## 2021-03-15 NOTE — Telephone Encounter (Signed)
Patient called back stating she is not having any more problems and will call us back if she wants to proceed with the pelvic ultrasound and possible endometrial biopsy. I will route this to Dr. Edward Jolly so she is aware of patients decision.

## 2021-03-15 NOTE — Telephone Encounter (Signed)
Please contact patient to reschedule pelvic ultrasound and possible endometrial biopsy.   She has had recurrent postmenopausal bleeding.

## 2021-03-15 NOTE — Telephone Encounter (Signed)
Left message to call to confirm okay to have someone from appointment desk call her to arrange u/s and possible endo bx.

## 2021-03-16 DIAGNOSIS — S52691D Other fracture of lower end of right ulna, subsequent encounter for closed fracture with routine healing: Secondary | ICD-10-CM | POA: Diagnosis not present

## 2021-03-17 NOTE — Telephone Encounter (Signed)
I do recommend the pelvic ultrasound and possible endometrial biopsy as any bleeding in menopause may be a sign of cancer.  Even if the bleeding has stopped, cancer may still be present.   I am not trying to frighten her.  I just want her to be healthy.   If she declines, please send a 30 day letter.

## 2021-03-18 NOTE — Telephone Encounter (Signed)
Left message to call Faydra Korman, RN at [facility], [phone].  

## 2021-03-19 NOTE — Telephone Encounter (Signed)
Patient returned call and left message to return call.   Call returned to patient.  Left detailed message, ok per dpr. Advised as seen below per Dr. Edward Jolly. Return call to advise how you would to proceed or with any additional questions.

## 2021-03-25 DIAGNOSIS — Z09 Encounter for follow-up examination after completed treatment for conditions other than malignant neoplasm: Secondary | ICD-10-CM | POA: Diagnosis not present

## 2021-03-26 NOTE — Telephone Encounter (Signed)
Per review of Epic, patient returned call and scheduled PUS with possible EMB on 05/18/21.   Routing to Dr. Marjorie Smolder.   Encounter closed.

## 2021-04-09 ENCOUNTER — Other Ambulatory Visit: Payer: Self-pay | Admitting: Obstetrics and Gynecology

## 2021-04-09 DIAGNOSIS — N950 Postmenopausal bleeding: Secondary | ICD-10-CM

## 2021-04-20 DIAGNOSIS — Z09 Encounter for follow-up examination after completed treatment for conditions other than malignant neoplasm: Secondary | ICD-10-CM | POA: Diagnosis not present

## 2021-04-22 ENCOUNTER — Other Ambulatory Visit: Payer: Self-pay

## 2021-04-22 ENCOUNTER — Encounter: Payer: Self-pay | Admitting: Primary Care

## 2021-04-22 ENCOUNTER — Ambulatory Visit (INDEPENDENT_AMBULATORY_CARE_PROVIDER_SITE_OTHER): Payer: BC Managed Care – PPO | Admitting: Primary Care

## 2021-04-22 VITALS — BP 118/78 | HR 96 | Temp 97.7°F | Ht 60.5 in | Wt 175.0 lb

## 2021-04-22 DIAGNOSIS — Z0000 Encounter for general adult medical examination without abnormal findings: Secondary | ICD-10-CM

## 2021-04-22 DIAGNOSIS — H35033 Hypertensive retinopathy, bilateral: Secondary | ICD-10-CM

## 2021-04-22 DIAGNOSIS — I10 Essential (primary) hypertension: Secondary | ICD-10-CM | POA: Diagnosis not present

## 2021-04-22 DIAGNOSIS — L900 Lichen sclerosus et atrophicus: Secondary | ICD-10-CM

## 2021-04-22 DIAGNOSIS — K219 Gastro-esophageal reflux disease without esophagitis: Secondary | ICD-10-CM

## 2021-04-22 DIAGNOSIS — E785 Hyperlipidemia, unspecified: Secondary | ICD-10-CM

## 2021-04-22 DIAGNOSIS — R7303 Prediabetes: Secondary | ICD-10-CM

## 2021-04-22 DIAGNOSIS — L940 Localized scleroderma [morphea]: Secondary | ICD-10-CM

## 2021-04-22 NOTE — Assessment & Plan Note (Signed)
Controlled in the office today. Continue HCTZ 25 mg and olmesartan 40 mg.  CMP pending.

## 2021-04-22 NOTE — Assessment & Plan Note (Signed)
Doing well on Plaquenil 200 mg BID. Continue same.

## 2021-04-22 NOTE — Assessment & Plan Note (Signed)
Declines influenza and shingles vaccines today, will return for these soon. Mammogram UTD. Pap smear UTD. Colonoscopy UTD, due 2023.  Discussed the importance of a healthy diet and regular exercise in order for weight loss, and to reduce the risk of further co-morbidity.  Exam today stable. Labs pending.

## 2021-04-22 NOTE — Patient Instructions (Signed)
Stop by the lab prior to leaving today. I will notify you of your results once received.   It was a pleasure to see you today!  Preventive Care 60-60 Years Old, Female Preventive care refers to lifestyle choices and visits with your health care provider that can promote health and wellness. This includes: A yearly physical exam. This is also called an annual wellness visit. Regular dental and eye exams. Immunizations. Screening for certain conditions. Healthy lifestyle choices, such as: Eating a healthy diet. Getting regular exercise. Not using drugs or products that contain nicotine and tobacco. Limiting alcohol use. What can I expect for my preventive care visit? Physical exam Your health care provider will check your: Height and weight. These may be used to calculate your BMI (body mass index). BMI is a measurement that tells if you are at a healthy weight. Heart rate and blood pressure. Body temperature. Skin for abnormal spots. Counseling Your health care provider may ask you questions about your: Past medical problems. Family's medical history. Alcohol, tobacco, and drug use. Emotional well-being. Home life and relationship well-being. Sexual activity. Diet, exercise, and sleep habits. Work and work Statistician. Access to firearms. Method of birth control. Menstrual cycle. Pregnancy history. What immunizations do I need? Vaccines are usually given at various ages, according to a schedule. Your health care provider will recommend vaccines for you based on your age, medical history, and lifestyle or other factors, such as travel or where you work. What tests do I need? Blood tests Lipid and cholesterol levels. These may be checked every 5 years, or more often if you are over 60 years old. Hepatitis C test. Hepatitis B test. Screening Lung cancer screening. You may have this screening every year starting at age 60 if you have a 30-pack-year history of smoking and  currently smoke or have quit within the past 15 years. Colorectal cancer screening. All adults should have this screening starting at age 60 and continuing until age 9. Your health care provider may recommend screening at age 77 if you are at increased risk. You will have tests every 1-10 years, depending on your results and the type of screening test. Diabetes screening. This is done by checking your blood sugar (glucose) after you have not eaten for a while (fasting). You may have this done every 1-3 years. Mammogram. This may be done every 1-2 years. Talk with your health care provider about when you should start having regular mammograms. This may depend on whether you have a family history of breast cancer. BRCA-related cancer screening. This may be done if you have a family history of breast, ovarian, tubal, or peritoneal cancers. Pelvic exam and Pap test. This may be done every 3 years starting at age 60. Starting at age 60, this may be done every 5 years if you have a Pap test in combination with an HPV test. Other tests STD (sexually transmitted disease) testing, if you are at risk. Bone density scan. This is done to screen for osteoporosis. You may have this scan if you are at high risk for osteoporosis. Talk with your health care provider about your test results, treatment options, and if necessary, the need for more tests. Follow these instructions at home: Eating and drinking  Eat a diet that includes fresh fruits and vegetables, whole grains, lean protein, and low-fat dairy products. Take vitamin and mineral supplements as recommended by your health care provider. Do not drink alcohol if: Your health care provider tells you not to  drink. You are pregnant, may be pregnant, or are planning to become pregnant. If you drink alcohol: Limit how much you have to 0-1 drink a day. Be aware of how much alcohol is in your drink. In the [place] one drink equals one 12 oz bottle of beer  (355 mL), one 5 oz glass of wine (148 mL), or one 1 oz glass of hard liquor (44 mL). Lifestyle Take daily care of your teeth and gums. Brush your teeth every morning and night with fluoride toothpaste. Floss one time each day. Stay active. Exercise for at least 30 minutes 5 or more days each week. Do not use any products that contain nicotine or tobacco, such as cigarettes, e-cigarettes, and chewing tobacco. If you need help quitting, ask your health care provider. Do not use drugs. If you are sexually active, practice safe sex. Use a condom or other form of protection to prevent STIs (sexually transmitted infections). If you do not wish to become pregnant, use a form of birth control. If you plan to become pregnant, see your health care provider for a prepregnancy visit. If told by your health care provider, take low-dose aspirin daily starting at age 17. Find healthy ways to cope with stress, such as: Meditation, yoga, or listening to music. Journaling. Talking to a trusted person. Spending time with friends and family. Safety Always wear your seat belt while driving or riding in a vehicle. Do not drive: If you have been drinking alcohol. Do not ride with someone who has been drinking. When you are tired or distracted. While texting. Wear a helmet and other protective equipment during sports activities. If you have firearms in your house, make sure you follow all gun safety procedures. What's next? Visit your health care provider once a year for an annual wellness visit. Ask your health care provider how often you should have your eyes and teeth checked. Stay up to date on all vaccines. This information is not intended to replace advice given to you by your health care provider. Make sure you discuss any questions you have with your health care provider. Document Revised: 09/11/2020 Document Reviewed: 03/15/2018 Elsevier Patient Education  2022 [employer].

## 2021-04-22 NOTE — Assessment & Plan Note (Signed)
Discussed the importance of a healthy diet and regular exercise in order for weight loss, and to reduce the risk of further co-morbidity.  Repeat lipid panel pending. 

## 2021-04-22 NOTE — Progress Notes (Signed)
Subjective:    Patient ID: Raven Romero, female    DOB: 1960-10-22, 60 y.o.   MRN: [id number]  HPI  Raven Romero is a very pleasant 60 y.o. female who presents today for complete physical and follow up of chronic conditions.  Immunizations: -Tetanus: 2013 -Influenza: Due, will get sometime this season  -Covid-19: 2 vaccines -Shingles: Never completed, declines today.   Diet: Fair diet.  Exercise: Walking some  Eye exam: Completes annually  Dental exam: Completes semi-annually   Pap Smear: Completed in 2020, follows with GYN Mammogram: Completed in December 2021 Colonoscopy: Completed in 2018, due 2023  BP Readings from Last 3 Encounters:  04/22/21 118/78  01/11/21 118/82  12/01/20 118/62       Review of Systems  Constitutional:  Negative for unexpected weight change.  HENT:  Negative for rhinorrhea.   Respiratory:  Negative for cough and shortness of breath.   Cardiovascular:  Negative for chest pain.  Gastrointestinal:  Negative for constipation and diarrhea.  Genitourinary:  Negative for difficulty urinating.  Musculoskeletal:  Negative for arthralgias and myalgias.  Skin:  Negative for rash.  Allergic/Immunologic: Negative for environmental allergies.  Neurological:  Negative for dizziness and headaches.  Psychiatric/Behavioral:  The patient is not nervous/anxious.         Past Medical History:  Diagnosis Date   Breast mass, right    Cervical polyp 04/09/15   2 small polyps on ultrasound   Hypertension    Kidney stones    Lichen sclerosus    Long-term use of Plaquenil 06/06/2017   Morphea    Shingles     Social History   Socioeconomic History   Marital status: Married    Spouse name: Not on file   Number of children: Not on file   Years of education: Not on file   Highest education level: Not on file  Occupational History   Not on file  Tobacco Use   Smoking status: Never   Smokeless tobacco: Never  Vaping Use   Vaping Use: Never  used  Substance and Sexual Activity   Alcohol use: Yes    Alcohol/week: 6.0 standard drinks    Types: 6 Glasses of wine per week    Comment: weekly   Drug use: No   Sexual activity: Yes    Partners: Male    Birth control/protection: Surgical    Comment: btsp  Other Topics Concern   Not on file  Social History Narrative   Married.    1 child, 2 grandchildren.   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   Enjoys traveling to the beach.    Social Determinants of Health   Financial Resource Strain: Not on file  Food Insecurity: Not on file  Transportation Needs: Not on file  Physical Activity: Not on file  Stress: Not on file  Social Connections: Not on file  Intimate Partner Violence: Not on file    Past Surgical History:  Procedure Laterality Date   BREAST EXCISIONAL BIOPSY Right 06/2015   BREAST LUMPECTOMY WITH RADIOACTIVE SEED LOCALIZATION Right 06/25/2015   Procedure: RIGHT BREAST SEED LOCALIZATION LUMPECTOMY ;  Surgeon: Harriette Bouillon, MD;  Location: [facility];  Service: General;  Laterality: Right;   CYSTECTOMY  1999   ovarian cystectomy   EXTRACORPOREAL SHOCK WAVE LITHOTRIPSY Left 06/17/2019   Procedure: EXTRACORPOREAL SHOCK WAVE LITHOTRIPSY (ESWL);  Surgeon: Heloise Purpura, MD;  Location: [facility];  Service: Urology;  Laterality: Left;   fractured arm Right 01/04/2021   NASAL  SINUS SURGERY     TUBAL LIGATION     BTSP    Family History  Problem Relation Age of Onset   Hypertension Mother    Breast cancer Mother    Heart disease Mother    Diabetes Father    Heart disease Father    Diabetes Paternal Grandmother    Diabetes Paternal Grandfather     Allergies  Allergen Reactions   Flagyl [Metronidazole] Nausea And Vomiting   Macrobid  [Nitrofurantoin] Nausea Only   Other     Medrol Dosepak - pt stated, "Made face feel like it was burning from the inside out; face was blood red"    Current Outpatient Medications on File Prior to Visit  Medication Sig Dispense  Refill   desonide (DESOWEN) 0.05 % cream desonide 0.05 % topical cream     estradiol (ESTRACE) 0.1 MG/GM vaginal cream USE 1/2 GRAM VAGINALLY 2-3 TIMES PER WEEK TO MAINTAIN SYMPTOM RELIEF AS DIRECTED 42.5 g 1   fluticasone (FLONASE) 50 MCG/ACT nasal spray Place 2 sprays into both nostrils as needed.     hydrochlorothiazide (HYDRODIURIL) 25 MG tablet Take 1 tablet (25 mg total) by mouth daily. For blood pressure 90 tablet 2   hydroxychloroquine (PLAQUENIL) 200 MG tablet Take 200 mg by mouth in the morning and at bedtime.     olmesartan (BENICAR) 40 MG tablet Take 1 tablet (40 mg total) by mouth daily. For blood pressure. 90 tablet 2   omeprazole (PRILOSEC) 40 MG capsule Take 40 mg by mouth daily.   1   phenazopyridine (PYRIDIUM) 95 MG tablet Take 95 mg by mouth 3 (three) times daily as needed for pain.     triamcinolone ointment (KENALOG) 0.1 % triamcinolone ointment 0.1% to the vulva in a thin layer bid x 2 weeks for a flare and twice weekly for maintenance. 30 g 1   TRULANCE 3 MG TABS Take 1 tablet by mouth daily as needed.      No current facility-administered medications on file prior to visit.    BP 118/78   Pulse 96   Temp 97.7 F (36.5 C) (Temporal)   Ht 5' 0.5" (1.537 m)   Wt 175 lb (79.4 kg)   LMP 08/18/2013   SpO2 98%   BMI 33.61 kg/m  Objective:   Physical Exam HENT:     Right Ear: Tympanic membrane and ear canal normal.     Left Ear: Tympanic membrane and ear canal normal.     Nose: Nose normal.  Eyes:     Conjunctiva/sclera: Conjunctivae normal.     Pupils: Pupils are equal, round, and reactive to light.  Neck:     Thyroid: No thyromegaly.  Cardiovascular:     Rate and Rhythm: Normal rate and regular rhythm.     Heart sounds: No murmur heard. Pulmonary:     Effort: Pulmonary effort is normal.     Breath sounds: Normal breath sounds. No rales.  Abdominal:     General: Bowel sounds are normal.     Palpations: Abdomen is soft.     Tenderness: There is no  abdominal tenderness.  Musculoskeletal:        General: Normal range of motion.     Cervical back: Neck supple.  Lymphadenopathy:     Cervical: No cervical adenopathy.  Skin:    General: Skin is warm and dry.     Findings: No rash.  Neurological:     Mental Status: She is alert and oriented to person,  place, and time.     Cranial Nerves: No cranial nerve deficit.     Deep Tendon Reflexes: Reflexes are normal and symmetric.  Psychiatric:        Mood and Affect: Mood normal.          Assessment & Plan:      This visit occurred during the SARS-CoV-2 public health emergency.  Safety protocols were in place, including screening questions prior to the visit, additional usage of staff PPE, and extensive cleaning of exam room while observing appropriate contact time as indicated for disinfecting solutions.

## 2021-04-22 NOTE — Assessment & Plan Note (Signed)
Discussed the importance of a healthy diet and regular exercise in order for weight loss, and to reduce the risk of further co-morbidity. ? ?Repeat A1C pending. ?

## 2021-04-22 NOTE — Assessment & Plan Note (Signed)
Following with [facility].  

## 2021-04-22 NOTE — Assessment & Plan Note (Signed)
Doing well omeprazole 40 mg daily. Discussed potentially reducing dose at some point. She will think about this.

## 2021-04-22 NOTE — Assessment & Plan Note (Signed)
Doing well overall. Following with dermatology.

## 2021-04-23 LAB — HEMOGLOBIN A1C: Hgb A1c MFr Bld: 5.5 % (ref 4.6–6.5)

## 2021-04-23 LAB — LIPID PANEL
Cholesterol: 168 mg/dL (ref 0–200)
HDL: 56.3 mg/dL (ref >39.00)
LDL Cholesterol: 87 mg/dL (ref 0–99)
NonHDL: 112.15
Total CHOL/HDL Ratio: 3
Triglycerides: 127 mg/dL (ref 0.0–149.0)
VLDL: 25.4 mg/dL (ref 0.0–40.0)

## 2021-04-23 LAB — COMPREHENSIVE METABOLIC PANEL
ALT: 10 U/L (ref 0–35)
AST: 18 U/L (ref 0–37)
Albumin: 4.5 g/dL (ref 3.5–5.2)
Alkaline Phosphatase: 73 U/L (ref 39–117)
BUN: 21 mg/dL (ref 6–23)
CO2: 29 mEq/L (ref 19–32)
Calcium: 9.9 mg/dL (ref 8.4–10.5)
Chloride: 98 mEq/L (ref 96–112)
Creatinine, Ser: 1.02 mg/dL (ref 0.40–1.20)
GFR: 59.92 mL/min — ABNORMAL LOW (ref >60.00)
Glucose, Bld: 133 mg/dL — ABNORMAL HIGH (ref 70–99)
Potassium: 3.5 mEq/L (ref 3.5–5.1)
Sodium: 136 mEq/L (ref 135–145)
Total Bilirubin: 0.4 mg/dL (ref 0.2–1.2)
Total Protein: 7.4 g/dL (ref 6.0–8.3)

## 2021-05-17 NOTE — Progress Notes (Signed)
GYNECOLOGY  VISIT   HPI: 60 y.o.   Married  Caucasian  female   G1P1001 with Patient's last menstrual period was 08/18/2013.   here for pelvic ultrasound and possible EMB.    Reported post menopausal bleeding, which followed intercourse.     Using vaginal estrogen cream about once a week.   Lichen sclerosus symptoms comes and goes occasionally.   GYNECOLOGIC HISTORY: Patient's last menstrual period was 08/18/2013. Contraception:  Tubal Menopausal hormone therapy:  Estrace cream prn Last mammogram: 07-09-20 3D/Neg/BiRads1 Last pap smear: 01-11-21 Neg:Neg HR HPV, 01-04-19 Neg:Neg HR HPV, 11-14-17 neg        OB History     Gravida  1   Para  1   Term  1   Preterm      AB      Living  1      SAB      IAB      Ectopic      Multiple      Live Births  1              Patient Active Problem List   Diagnosis Date Noted   Itching 12/01/2020   Prediabetes 12/01/2020   Kidney stone 04/10/2020   Preventative health care 04/10/2019   Morphea 04/10/2019   GERD (gastroesophageal reflux disease) 04/10/2019   Hyperglycemia 05/03/2018   Lichen sclerosus 12/12/2017   Age-related nuclear cataract of both eyes 06/06/2017   Hypertensive retinopathy of both eyes 06/06/2017   Cervical spondylosis 01/16/2017   Dyslipidemia (high LDL; low HDL) 12/10/2014   Essential hypertension 12/10/2014   Mixed incontinence 10/26/2012    Past Medical History:  Diagnosis Date   Breast mass, right    Cervical polyp 04/09/15   2 small polyps on ultrasound   Hypertension    Kidney stones    Lichen sclerosus    Long-term use of Plaquenil 06/06/2017   Morphea    Shingles     Past Surgical History:  Procedure Laterality Date   BREAST EXCISIONAL BIOPSY Right 06/2015   BREAST LUMPECTOMY WITH RADIOACTIVE SEED LOCALIZATION Right 06/25/2015   Procedure: RIGHT BREAST SEED LOCALIZATION LUMPECTOMY ;  Surgeon: Harriette Bouillon, MD;  Location: [facility];  Service: General;   Laterality: Right;   CYSTECTOMY  1999   ovarian cystectomy   EXTRACORPOREAL SHOCK WAVE LITHOTRIPSY Left 06/17/2019   Procedure: EXTRACORPOREAL SHOCK WAVE LITHOTRIPSY (ESWL);  Surgeon: Heloise Purpura, MD;  Location: [facility];  Service: Urology;  Laterality: Left;   fractured arm Right 01/04/2021   NASAL SINUS SURGERY     TUBAL LIGATION     BTSP    Current Outpatient Medications  Medication Sig Dispense Refill   desonide (DESOWEN) 0.05 % cream desonide 0.05 % topical cream     estradiol (ESTRACE) 0.1 MG/GM vaginal cream USE 1/2 GRAM VAGINALLY 2-3 TIMES PER WEEK TO MAINTAIN SYMPTOM RELIEF AS DIRECTED 42.5 g 1   fluticasone (FLONASE) 50 MCG/ACT nasal spray Place 2 sprays into both nostrils as needed.     hydrochlorothiazide (HYDRODIURIL) 25 MG tablet Take 1 tablet (25 mg total) by mouth daily. For blood pressure 90 tablet 2   hydroxychloroquine (PLAQUENIL) 200 MG tablet Take 200 mg by mouth in the morning and at bedtime.     olmesartan (BENICAR) 40 MG tablet Take 1 tablet (40 mg total) by mouth daily. For blood pressure. 90 tablet 2   omeprazole (PRILOSEC) 40 MG capsule Take 40 mg by mouth daily.   1  phenazopyridine (PYRIDIUM) 95 MG tablet Take 95 mg by mouth 3 (three) times daily as needed for pain.     triamcinolone ointment (KENALOG) 0.1 % triamcinolone ointment 0.1% to the vulva in a thin layer bid x 2 weeks for a flare and twice weekly for maintenance. 30 g 1   TRULANCE 3 MG TABS Take 1 tablet by mouth daily as needed.      No current facility-administered medications for this visit.     ALLERGIES: Flagyl [metronidazole], Macrobid  [nitrofurantoin], and Other  Family History  Problem Relation Age of Onset   Hypertension Mother    Breast cancer Mother    Heart disease Mother    Diabetes Father    Heart disease Father    Diabetes Paternal Grandmother    Diabetes Paternal Grandfather     Social History   Socioeconomic History   Marital status: Married    Spouse name: Not on  file   Number of children: Not on file   Years of education: Not on file   Highest education level: Not on file  Occupational History   Not on file  Tobacco Use   Smoking status: Never   Smokeless tobacco: Never  Vaping Use   Vaping Use: Never used  Substance and Sexual Activity   Alcohol use: Yes    Alcohol/week: 6.0 standard drinks    Types: 6 Glasses of wine per week    Comment: weekly   Drug use: No   Sexual activity: Yes    Partners: Male    Birth control/protection: Surgical    Comment: btsp  Other Topics Concern   Not on file  Social History Narrative   Married.    1 child, 2 grandchildren.   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   Enjoys traveling to the beach.    Social Determinants of Health   Financial Resource Strain: Not on file  Food Insecurity: Not on file  Transportation Needs: Not on file  Physical Activity: Not on file  Stress: Not on file  Social Connections: Not on file  Intimate Partner Violence: Not on file    Review of Systems  Constitutional:  Negative for fever.  All other systems reviewed and are negative.  PHYSICAL EXAMINATION:    BP 110/70   Pulse 79   Ht 5' (1.524 m)   Wt 185 lb (83.9 kg)   LMP 08/18/2013   SpO2 100%   BMI 36.13 kg/m     General appearance: alert, cooperative and appears stated age  Pelvic US - vaginal and abdominal  Uterus 6.05 x 3.36 x 2.31 cm.  No myometrial masses.  EMS 2.76 mm.  Thin and symmetric. Cervix with 2 hyperechoic areas with vascularity within the cervix:  0.3 cm and 0.4 cm.  Consistent with polyps.  Fluid in the cervix and not endometrium. Ovaries normal.  No adnexal masses.  No free fluid.  ASSESSMENT  Postmenopausal bleeding.  Vaginal atrophy likely the cause.  Cervical polyps, smaller than measured in 2016 pelvic US.  Negative cervical cancer screening.   PLAN  Pelvic US report and and images reviewed from today and 2016.  Reassurance given.  Cervical polyps discussed.  No need for removal. Call  for any future vaginal bleeding.  Would then do endometrial biopsy.  Use of estradiol vaginal cream 1/2 gram pv at sh 2 - 3 times per week can treat atrophy.  Follow up for routine annual exam and prn.    An After Visit Summary was printed and  given to the patient.  28 min  total time was spent for this patient encounter, including preparation, face-to-face counseling with the patient, coordination of care, and documentation of the encounter.

## 2021-05-18 ENCOUNTER — Ambulatory Visit (INDEPENDENT_AMBULATORY_CARE_PROVIDER_SITE_OTHER): Payer: BC Managed Care – PPO

## 2021-05-18 ENCOUNTER — Encounter: Payer: Self-pay | Admitting: Obstetrics and Gynecology

## 2021-05-18 ENCOUNTER — Ambulatory Visit (INDEPENDENT_AMBULATORY_CARE_PROVIDER_SITE_OTHER): Payer: BC Managed Care – PPO | Admitting: Obstetrics and Gynecology

## 2021-05-18 ENCOUNTER — Other Ambulatory Visit: Payer: Self-pay

## 2021-05-18 VITALS — BP 110/70 | HR 79 | Ht 60.0 in | Wt 185.0 lb

## 2021-05-18 DIAGNOSIS — N950 Postmenopausal bleeding: Secondary | ICD-10-CM

## 2021-05-18 DIAGNOSIS — N841 Polyp of cervix uteri: Secondary | ICD-10-CM

## 2021-05-18 DIAGNOSIS — N952 Postmenopausal atrophic vaginitis: Secondary | ICD-10-CM | POA: Diagnosis not present

## 2021-05-21 ENCOUNTER — Encounter: Payer: Self-pay | Admitting: Obstetrics and Gynecology

## 2021-06-07 ENCOUNTER — Other Ambulatory Visit: Payer: Self-pay | Admitting: Obstetrics and Gynecology

## 2021-06-07 DIAGNOSIS — Z1231 Encounter for screening mammogram for malignant neoplasm of breast: Secondary | ICD-10-CM

## 2021-06-23 DIAGNOSIS — H01131 Eczematous dermatitis of right upper eyelid: Secondary | ICD-10-CM | POA: Diagnosis not present

## 2021-06-23 DIAGNOSIS — L299 Pruritus, unspecified: Secondary | ICD-10-CM | POA: Diagnosis not present

## 2021-06-23 DIAGNOSIS — L940 Localized scleroderma [morphea]: Secondary | ICD-10-CM | POA: Diagnosis not present

## 2021-06-23 DIAGNOSIS — Z5181 Encounter for therapeutic drug level monitoring: Secondary | ICD-10-CM | POA: Diagnosis not present

## 2021-07-13 ENCOUNTER — Ambulatory Visit
Admission: RE | Admit: 2021-07-13 | Discharge: 2021-07-13 | Disposition: A | Discharge status: Home / Self Care | Payer: BC Managed Care – PPO | Source: Ambulatory Visit | Attending: Obstetrics and Gynecology | Admitting: Obstetrics and Gynecology

## 2021-07-13 DIAGNOSIS — Z1231 Encounter for screening mammogram for malignant neoplasm of breast: Secondary | ICD-10-CM | POA: Diagnosis not present

## 2021-07-15 ENCOUNTER — Ambulatory Visit: Payer: BC Managed Care – PPO | Admitting: Podiatry

## 2021-07-21 ENCOUNTER — Encounter: Payer: Self-pay | Admitting: Podiatry

## 2021-07-21 ENCOUNTER — Other Ambulatory Visit: Payer: Self-pay

## 2021-07-21 ENCOUNTER — Ambulatory Visit (INDEPENDENT_AMBULATORY_CARE_PROVIDER_SITE_OTHER): Payer: BC Managed Care – PPO | Admitting: Podiatry

## 2021-07-21 ENCOUNTER — Ambulatory Visit (INDEPENDENT_AMBULATORY_CARE_PROVIDER_SITE_OTHER): Payer: BC Managed Care – PPO

## 2021-07-21 DIAGNOSIS — M7751 Other enthesopathy of right foot: Secondary | ICD-10-CM

## 2021-07-21 DIAGNOSIS — M722 Plantar fascial fibromatosis: Secondary | ICD-10-CM

## 2021-07-21 DIAGNOSIS — M76821 Posterior tibial tendinitis, right leg: Secondary | ICD-10-CM

## 2021-07-21 MED ORDER — DICLOFENAC SODIUM 75 MG PO TBEC: 75.0000 mg | DELAYED_RELEASE_TABLET | Freq: Two times a day (BID) | ORAL | 2 refills | Status: DC

## 2021-07-21 MED ORDER — TRIAMCINOLONE ACETONIDE 10 MG/ML IJ SUSP
10.0000 mg | Freq: Once | INTRAMUSCULAR | Status: AC
  Administered 2021-07-21: 10 mg

## 2021-07-21 NOTE — Progress Notes (Signed)
Subjective:   Patient ID: Raven Romero, female   DOB: 61 y.o.   MRN: [id number]   HPI Patient states she is getting a lot more pain in her right over her left foot and states that its been sore and making it hard to walk.  Does not remember specific injury   ROS      Objective:  Physical Exam  Neurovascular status intact with inflammation mostly in the sinus tarsi right also some discomfort in the posterior tib right at its insertion with bone spur formation.  Patient may have twisted the foot but does not have specific injury and left foot is mildly tender     Assessment:  Inflammatory changes of the sinus tarsi right with fluid buildup along with capsulitis of the joint and posterior tibial which may be inflamed or may be just secondary to normal structure right over left      Plan:  H&P x-rays reviewed today I did sterile prep injected the right sinus tarsi 3 mg Kenalog 5 mg Xylocaine applied fascial brace to hold up the arch discussed enlargement around the posterior tib right over left at insertion navicular and we will reevaluate again in 4 weeks may require orthotics or other treatment  X-rays indicate large accessory navicular right foot over left foot with no indications of arthritis of the ankle

## 2021-08-11 ENCOUNTER — Ambulatory Visit: Payer: BC Managed Care – PPO | Admitting: Podiatry

## 2021-09-23 DIAGNOSIS — Z79899 Other long term (current) drug therapy: Secondary | ICD-10-CM | POA: Diagnosis not present

## 2021-09-30 DIAGNOSIS — J0140 Acute pansinusitis, unspecified: Secondary | ICD-10-CM | POA: Diagnosis not present

## 2021-11-09 ENCOUNTER — Other Ambulatory Visit: Payer: Self-pay | Admitting: Primary Care

## 2021-11-09 DIAGNOSIS — I10 Essential (primary) hypertension: Secondary | ICD-10-CM

## 2021-11-16 ENCOUNTER — Other Ambulatory Visit: Payer: Self-pay | Admitting: Primary Care

## 2021-11-16 DIAGNOSIS — I10 Essential (primary) hypertension: Secondary | ICD-10-CM

## 2021-12-04 ENCOUNTER — Other Ambulatory Visit: Payer: Self-pay | Admitting: Primary Care

## 2021-12-04 DIAGNOSIS — I10 Essential (primary) hypertension: Secondary | ICD-10-CM

## 2021-12-20 DIAGNOSIS — L940 Localized scleroderma [morphea]: Secondary | ICD-10-CM | POA: Diagnosis not present

## 2021-12-20 DIAGNOSIS — D1801 Hemangioma of skin and subcutaneous tissue: Secondary | ICD-10-CM | POA: Diagnosis not present

## 2021-12-20 DIAGNOSIS — L821 Other seborrheic keratosis: Secondary | ICD-10-CM | POA: Diagnosis not present

## 2021-12-20 DIAGNOSIS — L900 Lichen sclerosus et atrophicus: Secondary | ICD-10-CM | POA: Diagnosis not present

## 2022-01-04 DIAGNOSIS — N3000 Acute cystitis without hematuria: Secondary | ICD-10-CM | POA: Diagnosis not present

## 2022-01-11 NOTE — Progress Notes (Deleted)
61 y.o. G85P1001 Married Caucasian female here for annual exam.    PCP:     Patient's last menstrual period was 07/18/2010 (approximate).           Sexually active: {yes no:314532}  The current method of family planning is tubal ligation.    Exercising: {yes no:314532}  {types:19826} Smoker:  no  Health Maintenance: Pap:  01-11-21, 01-04-19 Neg:Neg HR HPV, 11-14-17 neg History of abnormal Pap:  no MMG:  07-13-21 Neg/Birads1 Colonoscopy:   2018 Neg;next 2028 BMD:   n/a  Result  n/a TDaP:  ***2013 Gardasil:   n/a HIV: 04-14-20 NR Hep C: 04-03-19 Neg Screening Labs:  Hb today: ***, Urine today: ***   reports that she has never smoked. She has never used smokeless tobacco. She reports current alcohol use of about 6.0 standard drinks of alcohol per week. She reports that she does not use drugs.  Past Medical History:  Diagnosis Date   Breast mass, right    Cervical polyp 04/09/2015   2 small polyps on ultrasound, smaller on [place] 05/18/21.   Hypertension    Kidney stones    Lichen sclerosus    Long-term use of Plaquenil 06/06/2017   Morphea    Shingles     Past Surgical History:  Procedure Laterality Date   BREAST EXCISIONAL BIOPSY Right 06/2015   BREAST LUMPECTOMY WITH RADIOACTIVE SEED LOCALIZATION Right 06/25/2015   Procedure: RIGHT BREAST SEED LOCALIZATION LUMPECTOMY ;  Surgeon: Harriette Bouillon, MD;  Location: [facility];  Service: General;  Laterality: Right;   CYSTECTOMY  1999   ovarian cystectomy   EXTRACORPOREAL SHOCK WAVE LITHOTRIPSY Left 06/17/2019   Procedure: EXTRACORPOREAL SHOCK WAVE LITHOTRIPSY (ESWL);  Surgeon: Heloise Purpura, MD;  Location: [facility];  Service: Urology;  Laterality: Left;   fractured arm Right 01/04/2021   NASAL SINUS SURGERY     TUBAL LIGATION     BTSP    Current Outpatient Medications  Medication Sig Dispense Refill   desonide (DESOWEN) 0.05 % cream desonide 0.05 % topical cream     diclofenac (VOLTAREN) 75 MG EC tablet Take 1  tablet (75 mg total) by mouth 2 (two) times daily. 50 tablet 2   estradiol (ESTRACE) 0.1 MG/GM vaginal cream USE 1/2 GRAM VAGINALLY 2-3 TIMES PER WEEK TO MAINTAIN SYMPTOM RELIEF AS DIRECTED 42.5 g 1   fluticasone (FLONASE) 50 MCG/ACT nasal spray Place 2 sprays into both nostrils as needed.     hydrochlorothiazide (HYDRODIURIL) 25 MG tablet TAKE 1 TABLET(25 MG) BY MOUTH DAILY FOR BLOOD PRESSURE 90 tablet 0   hydroxychloroquine (PLAQUENIL) 200 MG tablet Take 200 mg by mouth in the morning and at bedtime.     olmesartan (BENICAR) 40 MG tablet TAKE 1 TABLET(40 MG) BY MOUTH DAILY FOR BLOOD PRESSURE 90 tablet 0   omeprazole (PRILOSEC) 40 MG capsule Take 40 mg by mouth daily.   1   phenazopyridine (PYRIDIUM) 95 MG tablet Take 95 mg by mouth 3 (three) times daily as needed for pain.     triamcinolone ointment (KENALOG) 0.1 % triamcinolone ointment 0.1% to the vulva in a thin layer bid x 2 weeks for a flare and twice weekly for maintenance. 30 g 1   TRULANCE 3 MG TABS Take 1 tablet by mouth daily as needed.      No current facility-administered medications for this visit.    Family History  Problem Relation Age of Onset   Hypertension Mother    Breast cancer Mother  Heart disease Mother    Diabetes Father    Heart disease Father    Diabetes Paternal Grandmother    Diabetes Paternal Grandfather     Review of Systems  Exam:   LMP 07/18/2010 (Approximate)     General appearance: alert, cooperative and appears stated age Head: normocephalic, without obvious abnormality, atraumatic Neck: no adenopathy, supple, symmetrical, trachea midline and thyroid normal to inspection and palpation Lungs: clear to auscultation bilaterally Breasts: normal appearance, no masses or tenderness, No nipple retraction or dimpling, No nipple discharge or bleeding, No axillary adenopathy Heart: regular rate and rhythm Abdomen: soft, non-tender; no masses, no organomegaly Extremities: extremities normal, atraumatic,  no cyanosis or edema Skin: skin color, texture, turgor normal. No rashes or lesions Lymph nodes: cervical, supraclavicular, and axillary nodes normal. Neurologic: grossly normal  Pelvic: External genitalia:  no lesions              No abnormal inguinal nodes palpated.              Urethra:  normal appearing urethra with no masses, tenderness or lesions              Bartholins and Skenes: normal                 Vagina: normal appearing vagina with normal color and discharge, no lesions              Cervix: no lesions              Pap taken: {yes no:314532} Bimanual Exam:  Uterus:  normal size, contour, position, consistency, mobility, non-tender              Adnexa: no mass, fullness, tenderness              Rectal exam: {yes no:314532}.  Confirms.              Anus:  normal sphincter tone, no lesions  Chaperone was present for exam:  ***  Assessment:   Well woman visit with gynecologic exam.   Plan: Mammogram screening discussed. Self breast awareness reviewed. Pap and HR HPV as above. Guidelines for Calcium, Vitamin D, regular exercise program including cardiovascular and weight bearing exercise.   Follow up annually and prn.   Additional counseling given.  {yes [id number]. _______ minutes face to face time of which over 50% was spent in counseling.    After visit summary provided.

## 2022-01-12 ENCOUNTER — Ambulatory Visit: Payer: BC Managed Care – PPO | Admitting: Obstetrics and Gynecology

## 2022-01-24 ENCOUNTER — Encounter: Payer: Self-pay | Admitting: Obstetrics and Gynecology

## 2022-01-24 ENCOUNTER — Ambulatory Visit (INDEPENDENT_AMBULATORY_CARE_PROVIDER_SITE_OTHER): Payer: BC Managed Care – PPO | Admitting: Obstetrics and Gynecology

## 2022-01-24 VITALS — BP 114/70 | Ht 60.0 in | Wt 150.0 lb

## 2022-01-24 DIAGNOSIS — R350 Frequency of micturition: Secondary | ICD-10-CM | POA: Diagnosis not present

## 2022-01-24 DIAGNOSIS — N760 Acute vaginitis: Secondary | ICD-10-CM | POA: Diagnosis not present

## 2022-01-24 DIAGNOSIS — R300 Dysuria: Secondary | ICD-10-CM

## 2022-01-24 LAB — WET PREP FOR TRICH, YEAST, CLUE

## 2022-01-24 LAB — URINALYSIS, COMPLETE W/RFL CULTURE
Bacteria, UA: NONE SEEN /HPF
Bilirubin Urine: NEGATIVE
Casts: NONE SEEN /LPF
Crystals: NONE SEEN /HPF
Glucose, UA: NEGATIVE
Hyaline Cast: NONE SEEN /LPF
Ketones, ur: NEGATIVE
Leukocyte Esterase: NEGATIVE
Nitrites, Initial: NEGATIVE
Protein, ur: NEGATIVE
RBC / HPF: NONE SEEN /HPF (ref 0–2)
Specific Gravity, Urine: 1.01 (ref 1.001–1.035)
WBC, UA: NONE SEEN /HPF (ref 0–5)
Yeast: NONE SEEN /HPF
pH: 5.5 (ref 5.0–8.0)

## 2022-01-24 LAB — NO CULTURE INDICATED

## 2022-01-24 MED ORDER — ESTRADIOL 0.1 MG/GM VA CREA
TOPICAL_CREAM | VAGINAL | 0 refills | Status: DC
Start: 2022-01-24 — End: 2022-03-08

## 2022-01-24 NOTE — Progress Notes (Signed)
GYNECOLOGY  VISIT   HPI: 61 y.o.   Married  Caucasian  female   G1P1001 with Patient's last menstrual period was 07/18/2010 (approximate).   here for urinary frequency and dysuria. Low back pain.  A little nausea.   Taking AZO for some weeks.  It is not longer helping.   Patient has lichen sclerosis, vaginal atrophy, and morphea.  Not using any triamcinolone on the vulva.   Forgets to use her vaginal estrogen cream.   Having itching and some discharge.  No vaginal odor.   GYNECOLOGIC HISTORY: Patient's last menstrual period was 07/18/2010 (approximate). Contraception:  Tubal Menopausal hormone therapy:   Estrogen cream Last mammogram:  07-13-21 Neg/BiRads1 Last pap smear:   01-11-21 Neg:Neg HR HPV, 01-04-19 Neg:Neg HR HPV, 11-14-17 neg               OB History     Gravida  1   Para  1   Term  1   Preterm      AB      Living  1      SAB      IAB      Ectopic      Multiple      Live Births  1              Patient Active Problem List   Diagnosis Date Noted   Itching 12/01/2020   Prediabetes 12/01/2020   Kidney stone 04/10/2020   Preventative health care 04/10/2019   Morphea 04/10/2019   GERD (gastroesophageal reflux disease) 04/10/2019   Hyperglycemia 05/03/2018   Lichen sclerosus 12/12/2017   Age-related nuclear cataract of both eyes 06/06/2017   Hypertensive retinopathy of both eyes 06/06/2017   Cervical spondylosis 01/16/2017   Dyslipidemia (high LDL; low HDL) 12/10/2014   Essential hypertension 12/10/2014   Mixed incontinence 10/26/2012    Past Medical History:  Diagnosis Date   Breast mass, right    Cervical polyp 04/09/2015   2 small polyps on ultrasound, smaller on [place] 05/18/21.   Hypertension    Kidney stones    Lichen sclerosus    Long-term use of Plaquenil 06/06/2017   Morphea    Shingles     Past Surgical History:  Procedure Laterality Date   BREAST EXCISIONAL BIOPSY Right 06/2015   BREAST LUMPECTOMY WITH RADIOACTIVE SEED  LOCALIZATION Right 06/25/2015   Procedure: RIGHT BREAST SEED LOCALIZATION LUMPECTOMY ;  Surgeon: Harriette Bouillon, MD;  Location: [facility];  Service: General;  Laterality: Right;   CYSTECTOMY  1999   ovarian cystectomy   EXTRACORPOREAL SHOCK WAVE LITHOTRIPSY Left 06/17/2019   Procedure: EXTRACORPOREAL SHOCK WAVE LITHOTRIPSY (ESWL);  Surgeon: Heloise Purpura, MD;  Location: [facility];  Service: Urology;  Laterality: Left;   fractured arm Right 01/04/2021   NASAL SINUS SURGERY     TUBAL LIGATION     BTSP    Current Outpatient Medications  Medication Sig Dispense Refill   desonide (DESOWEN) 0.05 % cream desonide 0.05 % topical cream     estradiol (ESTRACE) 0.1 MG/GM vaginal cream USE 1/2 GRAM VAGINALLY 2-3 TIMES PER WEEK TO MAINTAIN SYMPTOM RELIEF AS DIRECTED 42.5 g 1   fluticasone (FLONASE) 50 MCG/ACT nasal spray Place 2 sprays into both nostrils as needed.     hydrochlorothiazide (HYDRODIURIL) 25 MG tablet TAKE 1 TABLET(25 MG) BY MOUTH DAILY FOR BLOOD PRESSURE 90 tablet 0   olmesartan (BENICAR) 40 MG tablet TAKE 1 TABLET(40 MG) BY MOUTH DAILY FOR BLOOD PRESSURE 90 tablet  0   omeprazole (PRILOSEC) 40 MG capsule Take 40 mg by mouth daily.   1   phenazopyridine (PYRIDIUM) 95 MG tablet Take 95 mg by mouth 3 (three) times daily as needed for pain.     triamcinolone ointment (KENALOG) 0.1 % triamcinolone ointment 0.1% to the vulva in a thin layer bid x 2 weeks for a flare and twice weekly for maintenance. 30 g 1   No current facility-administered medications for this visit.     ALLERGIES: Flagyl [metronidazole], Macrobid  [nitrofurantoin], and Other  Family History  Problem Relation Age of Onset   Hypertension Mother    Breast cancer Mother    Heart disease Mother    Diabetes Father    Heart disease Father    Diabetes Paternal Grandmother    Diabetes Paternal Grandfather     Social History   Socioeconomic History   Marital status: Married    Spouse name: Not on file    Number of children: Not on file   Years of education: Not on file   Highest education level: Not on file  Occupational History   Not on file  Tobacco Use   Smoking status: Never   Smokeless tobacco: Never  Vaping Use   Vaping Use: Never used  Substance and Sexual Activity   Alcohol use: Yes    Alcohol/week: 6.0 standard drinks of alcohol    Types: 6 Glasses of wine per week    Comment: weekly   Drug use: No   Sexual activity: Yes    Partners: Male    Birth control/protection: Surgical    Comment: btsp  Other Topics Concern   Not on file  Social History Narrative   Married.    1 child, 2 grandchildren.   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   Enjoys traveling to the beach.    Social Determinants of Health   Financial Resource Strain: Not on file  Food Insecurity: Not on file  Transportation Needs: Not on file  Physical Activity: Not on file  Stress: Not on file  Social Connections: Not on file  Intimate Partner Violence: Not on file    Review of Systems  Genitourinary:  Positive for dysuria and frequency.  All other systems reviewed and are negative.   PHYSICAL EXAMINATION:    BP 114/70   Ht 5' (1.524 m)   Wt 150 lb (68 kg)   LMP 07/18/2010 (Approximate)   BMI 29.29 kg/m     General appearance: alert, cooperative and appears stated age   Pelvic: External genitalia: superior vulvar skin stained yellow.               Urethra:  normal appearing urethra with no masses, tenderness or lesions              Bartholins and Skenes: normal                 Vagina: normal appearing vagina with normal color and discharge, no lesions              Cervix: no lesions                Bimanual Exam:  Uterus:  normal size, contour, position, consistency, mobility, non-tender              Adnexa: no mass, fullness, tenderness   Chaperone was present for exam:  Marchelle Folks, CMA  ASSESSMENT  Dysuria.  Frequency.  Lichen sclerosus.  Vaginal atrophy.    PLAN  Urinalysis:  sg  1.010, pH 5.5,  trace blood, NS WBC, NS RBC, 0 - 5 epis, no bacteria.   UC ordered and confirmed that the lab will send this.  Wet prep:  negative for yeast, trichomonas, and clue cells.  Refill of vaginal estrogen cream, 1/2 gram pv at hs 2 - 3 nights per week.  Ok to use it without the applicator. Keep annual exam appointment.    An After Visit Summary was printed and given to the patient.  25 min  total time was spent for this patient encounter, including preparation, face-to-face counseling with the patient, coordination of care, and documentation of the encounter.

## 2022-01-25 LAB — URINE CULTURE
MICRO NUMBER:: 13625186
Result:: NO GROWTH
SPECIMEN QUALITY:: ADEQUATE

## 2022-02-01 DIAGNOSIS — K5730 Diverticulosis of large intestine without perforation or abscess without bleeding: Secondary | ICD-10-CM | POA: Diagnosis not present

## 2022-02-01 DIAGNOSIS — Z1211 Encounter for screening for malignant neoplasm of colon: Secondary | ICD-10-CM | POA: Diagnosis not present

## 2022-02-01 DIAGNOSIS — K5900 Constipation, unspecified: Secondary | ICD-10-CM | POA: Diagnosis not present

## 2022-02-01 DIAGNOSIS — K219 Gastro-esophageal reflux disease without esophagitis: Secondary | ICD-10-CM | POA: Diagnosis not present

## 2022-02-01 DIAGNOSIS — Z86010 Personal history of colonic polyps: Secondary | ICD-10-CM | POA: Diagnosis not present

## 2022-02-02 LAB — CBC: RBC: 3.94 (ref 3.87–5.11)

## 2022-02-02 LAB — BASIC METABOLIC PANEL
BUN: 19 (ref 4–21)
CO2: 26 — AB (ref 13–22)
Chloride: 100 (ref 99–108)
Creatinine: 0.9 (ref 0.5–1.1)
Glucose: 107
Potassium: 3.5 mEq/L (ref 3.5–5.1)
Sodium: 138 (ref 137–147)

## 2022-02-02 LAB — HEPATIC FUNCTION PANEL
ALT: 13 U/L (ref 7–35)
AST: 16 (ref 13–35)
Alkaline Phosphatase: 72 (ref 25–125)
Bilirubin, Direct: 0.22 (ref 0.01–0.4)
Bilirubin, Total: 0.8

## 2022-02-02 LAB — CBC AND DIFFERENTIAL
Hemoglobin: 11.7 — AB (ref 12.0–16.0)
Platelets: 268 10*3/uL (ref 150–400)
WBC: 3.2

## 2022-02-02 LAB — TSH: TSH: 1.08 (ref 0.41–5.90)

## 2022-02-02 LAB — COMPREHENSIVE METABOLIC PANEL: Calcium: 10.1 (ref 8.7–10.7)

## 2022-02-04 ENCOUNTER — Other Ambulatory Visit: Payer: Self-pay | Admitting: Obstetrics and Gynecology

## 2022-02-15 ENCOUNTER — Encounter: Payer: Self-pay | Admitting: Primary Care

## 2022-03-01 NOTE — Progress Notes (Signed)
61 y.o. G42P1001 Married Caucasian female here for annual exam.    Using vaginal estrogen cream.   Expecting a new grandchild.   PCP: Vernona Rieger, NP    Patient's last menstrual period was 07/18/2010 (approximate).           Sexually active: Yes.    The current method of family planning is tubal ligation.    Exercising: Yes.     walking Smoker:  no  Health Maintenance: Pap:   01-11-21 Neg:Neg HR HPV, 01-04-19 Neg:Neg HR HPV, 11-14-17 neg  History of abnormal Pap:  no MMG:  07-13-21 Neg/BiRads1 Colonoscopy:  2018 Neg;next in Nov. 2023.  Dr. Loreta Ave. BMD:   n/a  Result  n/a TDaP:  PCP Gardasil:   no HIV: Neg in past Hep C: 04-03-19 Neg Screening Labs:  PCP   reports that she has never smoked. She has never used smokeless tobacco. She reports current alcohol use of about 3.0 standard drinks of alcohol per week. She reports that she does not use drugs.  Past Medical History:  Diagnosis Date   Breast mass, right    Cervical polyp 04/09/2015   2 small polyps on ultrasound, smaller on [place] 05/18/21.   Hypertension    Kidney stones    Lichen sclerosus    Long-term use of Plaquenil 06/06/2017   Morphea    Shingles     Past Surgical History:  Procedure Laterality Date   BREAST EXCISIONAL BIOPSY Right 06/2015   BREAST LUMPECTOMY WITH RADIOACTIVE SEED LOCALIZATION Right 06/25/2015   Procedure: RIGHT BREAST SEED LOCALIZATION LUMPECTOMY ;  Surgeon: Harriette Bouillon, MD;  Location: [facility];  Service: General;  Laterality: Right;   CYSTECTOMY  1999   ovarian cystectomy   EXTRACORPOREAL SHOCK WAVE LITHOTRIPSY Left 06/17/2019   Procedure: EXTRACORPOREAL SHOCK WAVE LITHOTRIPSY (ESWL);  Surgeon: Heloise Purpura, MD;  Location: [facility];  Service: Urology;  Laterality: Left;   fractured arm Right 01/04/2021   NASAL SINUS SURGERY     TUBAL LIGATION     BTSP    Current Outpatient Medications  Medication Sig Dispense Refill   desonide (DESOWEN) 0.05 % cream desonide 0.05 %  topical cream     estradiol (ESTRACE) 0.1 MG/GM vaginal cream USE 1/2 GRAM VAGINALLY 2-3 TIMES PER WEEK TO MAINTAIN SYMPTOM RELIEF AS DIRECTED 42.5 g 0   fluticasone (FLONASE) 50 MCG/ACT nasal spray Place 2 sprays into both nostrils as needed.     hydrochlorothiazide (HYDRODIURIL) 25 MG tablet TAKE 1 TABLET(25 MG) BY MOUTH DAILY FOR BLOOD PRESSURE 90 tablet 0   olmesartan (BENICAR) 40 MG tablet TAKE 1 TABLET(40 MG) BY MOUTH DAILY FOR BLOOD PRESSURE 90 tablet 0   omeprazole (PRILOSEC) 40 MG capsule Take 1 tablet by mouth daily.     triamcinolone ointment (KENALOG) 0.1 % triamcinolone ointment 0.1% to the vulva in a thin layer bid x 2 weeks for a flare and twice weekly for maintenance. 30 g 1   No current facility-administered medications for this visit.    Family History  Problem Relation Age of Onset   Hypertension Mother    Breast cancer Mother    Heart disease Mother    Diabetes Father    Heart disease Father    Diabetes Paternal Grandmother    Diabetes Paternal Grandfather     Review of Systems  All other systems reviewed and are negative.   Exam:   BP 100/64   Pulse 99   Ht 4' 11.75" (1.518 m)  Wt 149 lb (67.6 kg)   LMP 07/18/2010 (Approximate)   BMI 29.34 kg/m     General appearance: alert, cooperative and appears stated age Head: normocephalic, without obvious abnormality, atraumatic Neck: no adenopathy, supple, symmetrical, trachea midline and thyroid normal to inspection and palpation Lungs: clear to auscultation bilaterally Breasts: normal appearance, no masses or tenderness, No nipple retraction or dimpling, No nipple discharge or bleeding, No axillary adenopathy Heart: regular rate and rhythm Abdomen: soft, non-tender; no masses, no organomegaly Extremities: extremities normal, atraumatic, no cyanosis or edema Skin: skin color, texture, turgor normal. Dark pigmentation patches of the skin.  Lymph nodes: cervical, supraclavicular, and axillary nodes  normal. Neurologic: grossly normal  Pelvic: External genitalia:  no lesions              No abnormal inguinal nodes palpated.              Urethra:  normal appearing urethra with no masses, tenderness or lesions              Bartholins and Skenes: normal                 Vagina: normal appearing vagina with normal color and discharge, no lesions              Cervix: no lesions              Pap taken: no Bimanual Exam:  Uterus:  normal size, contour, position, consistency, mobility, non-tender              Adnexa: no mass, fullness, tenderness              Rectal exam: yes.  Confirms.              Anus:  normal sphincter tone, no lesions  Chaperone was present for exam:  Marchelle Folks, CMA  Assessment:   Well woman visit with gynecologic exam. Lichen sclerosus.  Hx vulvitis.  Morphea.  FH breast cancer.   Plan: Mammogram screening discussed. Self breast awareness reviewed. Pap and HR HPV as above. Guidelines for Calcium, Vitamin D, regular exercise program including cardiovascular and weight bearing exercise. Refill of vaginal estrogen cream.  I dicussed potential effect on breast cancer.  Refill of Triamcinolone ointment.   Follow up annually and prn.   After visit summary provided.

## 2022-03-08 ENCOUNTER — Encounter: Payer: Self-pay | Admitting: Obstetrics and Gynecology

## 2022-03-08 ENCOUNTER — Ambulatory Visit (INDEPENDENT_AMBULATORY_CARE_PROVIDER_SITE_OTHER): Payer: BC Managed Care – PPO | Admitting: Obstetrics and Gynecology

## 2022-03-08 ENCOUNTER — Ambulatory Visit: Payer: BC Managed Care – PPO | Admitting: Obstetrics and Gynecology

## 2022-03-08 ENCOUNTER — Other Ambulatory Visit: Payer: Self-pay | Admitting: Primary Care

## 2022-03-08 VITALS — BP 100/64 | HR 99 | Ht 59.75 in | Wt 149.0 lb

## 2022-03-08 DIAGNOSIS — I10 Essential (primary) hypertension: Secondary | ICD-10-CM

## 2022-03-08 DIAGNOSIS — Z01419 Encounter for gynecological examination (general) (routine) without abnormal findings: Secondary | ICD-10-CM

## 2022-03-08 MED ORDER — TRIAMCINOLONE ACETONIDE 0.1 % EX OINT: TOPICAL_OINTMENT | CUTANEOUS | 1 refills | Status: DC

## 2022-03-08 MED ORDER — HYDROCHLOROTHIAZIDE 25 MG PO TABS: 25.0000 mg | ORAL_TABLET | Freq: Every day | ORAL | 0 refills | Status: DC

## 2022-03-08 MED ORDER — OLMESARTAN MEDOXOMIL 40 MG PO TABS: 40.0000 mg | ORAL_TABLET | Freq: Every day | ORAL | 0 refills | Status: DC

## 2022-03-08 MED ORDER — ESTRADIOL 0.1 MG/GM VA CREA: TOPICAL_CREAM | VAGINAL | 1 refills | Status: DC

## 2022-03-08 NOTE — Patient Instructions (Signed)

## 2022-03-10 ENCOUNTER — Other Ambulatory Visit: Payer: Self-pay | Admitting: Obstetrics and Gynecology

## 2022-03-10 DIAGNOSIS — Z1231 Encounter for screening mammogram for malignant neoplasm of breast: Secondary | ICD-10-CM

## 2022-03-18 DIAGNOSIS — J018 Other acute sinusitis: Secondary | ICD-10-CM | POA: Diagnosis not present

## 2022-03-18 DIAGNOSIS — Z6826 Body mass index (BMI) 26.0-26.9, adult: Secondary | ICD-10-CM | POA: Diagnosis not present

## 2022-03-18 DIAGNOSIS — R051 Acute cough: Secondary | ICD-10-CM | POA: Diagnosis not present

## 2022-04-07 ENCOUNTER — Ambulatory Visit (INDEPENDENT_AMBULATORY_CARE_PROVIDER_SITE_OTHER)
Admission: RE | Admit: 2022-04-07 | Discharge: 2022-04-07 | Disposition: A | Discharge status: Home / Self Care | Payer: BC Managed Care – PPO | Source: Ambulatory Visit | Attending: Primary Care | Admitting: Primary Care

## 2022-04-07 ENCOUNTER — Ambulatory Visit (INDEPENDENT_AMBULATORY_CARE_PROVIDER_SITE_OTHER): Payer: BC Managed Care – PPO | Admitting: Primary Care

## 2022-04-07 ENCOUNTER — Encounter: Payer: Self-pay | Admitting: Primary Care

## 2022-04-07 ENCOUNTER — Other Ambulatory Visit: Payer: Self-pay | Admitting: Primary Care

## 2022-04-07 VITALS — BP 100/68 | HR 80 | Temp 97.3°F | Ht 59.75 in | Wt 153.0 lb

## 2022-04-07 DIAGNOSIS — R051 Acute cough: Secondary | ICD-10-CM | POA: Diagnosis not present

## 2022-04-07 DIAGNOSIS — R059 Cough, unspecified: Secondary | ICD-10-CM | POA: Diagnosis not present

## 2022-04-07 MED ORDER — GUAIFENESIN-CODEINE 100-10 MG/5ML PO SYRP: 5.0000 mL | ORAL_SOLUTION | Freq: Three times a day (TID) | ORAL | 0 refills | Status: DC | PRN

## 2022-04-07 MED ORDER — FLUTICASONE PROPIONATE 50 MCG/ACT NA SUSP: 1.0000 | Freq: Two times a day (BID) | NASAL | 0 refills | Status: DC

## 2022-04-07 NOTE — Patient Instructions (Addendum)
Complete xray(s) prior to leaving today. I will notify you of your results once received.   We sent in the prescription for the cough syrup. It can make you drowsy.   Start taking Zyrtec once daily along with Flonase.   It was a pleasure to see you today!

## 2022-04-07 NOTE — Assessment & Plan Note (Addendum)
Suspect viral etiology, especially since no improvement post 7 days of Augmentin. Could be post viral URI cough.   Chest x-ray pending.   Recommend Zyrtec once daily.  Rx for Cheratussin sent to pharmacy. Drowsiness precautions provided.   Resume Flonase twice daily. Refill sent.  Await results.  I evaluated patient, was consulted regarding treatment, and agree with assessment and plan per Tinnie Gens, RN, DNP student.   Allie Bossier, NP-C

## 2022-04-07 NOTE — Progress Notes (Signed)
Subjective:    Patient ID: Raven Romero, female    DOB: 08/06/1960, 61 y.o.   MRN: [id number]  Cough Associated symptoms include headaches and postnasal drip. Pertinent negatives include no fever.    Raven Romero is a very pleasant 61 y.o. female with a history of hypertension, GERD, prediabetes who presents today to discuss cough.  Symptoms began 4 weeks ago with fatigue and not overall feeling well. She then developed a cough, headaches, sinus pressure. She will experience coughing spells intermittently. She has been exposed to several family members with same symptoms.   She was evaluated virtually through [facility] three weeks ago, treated with Tessalon Perles, Augmentin BID x 7 days. She completed her antibiotics and didn't notice much of an improvement. She's been taking OTC nasal decongestant, Mucinex, and OTC cough syrup without resolve in symptoms. Overall she's feeling about the same.   She took a home Covid-19 test a few weeks ago which was negative. She denies known exposure to Covid-19.    Review of Systems  Constitutional:  Positive for fatigue. Negative for fever.  HENT:  Positive for congestion, postnasal drip and sinus pressure.   Respiratory:  Positive for cough.   Neurological:  Positive for headaches.         Past Medical History:  Diagnosis Date   Breast mass, right    Cervical polyp 04/09/2015   2 small polyps on ultrasound, smaller on [place] 05/18/21.   Hypertension    Kidney stones    Lichen sclerosus    Long-term use of Plaquenil 06/06/2017   Morphea    Shingles     Social History   Socioeconomic History   Marital status: Married    Spouse name: Not on file   Number of children: Not on file   Years of education: Not on file   Highest education level: Not on file  Occupational History   Not on file  Tobacco Use   Smoking status: Never   Smokeless tobacco: Never  Vaping Use   Vaping Use: Never used  Substance and Sexual Activity   Alcohol use:  Yes    Alcohol/week: 3.0 standard drinks of alcohol    Types: 3 Glasses of wine per week    Comment: weekly   Drug use: No   Sexual activity: Yes    Partners: Male    Birth control/protection: Surgical    Comment: btsp  Other Topics Concern   Not on file  Social History Narrative   Married.    1 child, 2 grandchildren.   Works in Science writer.   Enjoys traveling to the beach.    Social Determinants of Health   Financial Resource Strain: Not on file  Food Insecurity: Not on file  Transportation Needs: Not on file  Physical Activity: Not on file  Stress: Not on file  Social Connections: Not on file  Intimate Partner Violence: Not on file    Past Surgical History:  Procedure Laterality Date   BREAST EXCISIONAL BIOPSY Right 06/2015   BREAST LUMPECTOMY WITH RADIOACTIVE SEED LOCALIZATION Right 06/25/2015   Procedure: RIGHT BREAST SEED LOCALIZATION LUMPECTOMY ;  Surgeon: Erroll Luna, MD;  Location: [facility];  Service: General;  Laterality: Right;   CYSTECTOMY  1999   ovarian cystectomy   EXTRACORPOREAL SHOCK WAVE LITHOTRIPSY Left 06/17/2019   Procedure: EXTRACORPOREAL SHOCK WAVE LITHOTRIPSY (ESWL);  Surgeon: Raynelle Bring, MD;  Location: [facility];  Service: Urology;  Laterality: Left;   fractured arm Right 01/04/2021  NASAL SINUS SURGERY     TUBAL LIGATION     BTSP    Family History  Problem Relation Age of Onset   Hypertension Mother    Breast cancer Mother    Heart disease Mother    Diabetes Father    Heart disease Father    Diabetes Paternal Grandmother    Diabetes Paternal Grandfather     Allergies  Allergen Reactions   Flagyl [Metronidazole] Nausea And Vomiting   Macrobid  [Nitrofurantoin] Nausea Only   Other     Medrol Dosepak - pt stated, "Made face feel like it was burning from the inside out; face was blood red"    Current Outpatient Medications on File Prior to Visit  Medication Sig Dispense Refill   desonide (DESOWEN) 0.05 % cream  desonide 0.05 % topical cream     estradiol (ESTRACE) 0.1 MG/GM vaginal cream USE 1/2 GRAM VAGINALLY 2-3 TIMES PER WEEK TO MAINTAIN SYMPTOM RELIEF AS DIRECTED 42.5 g 1   olmesartan (BENICAR) 40 MG tablet Take 1 tablet (40 mg total) by mouth daily. for blood pressure. Office visit required for further refills. 90 tablet 0   omeprazole (PRILOSEC) 40 MG capsule Take 1 tablet by mouth daily.     No current facility-administered medications on file prior to visit.    BP 100/68   Pulse 80   Temp (!) 97.3 F (36.3 C) (Temporal)   Ht 4' 11.75" (1.518 m)   Wt 153 lb (69.4 kg)   LMP 07/18/2010 (Approximate)   SpO2 99%   BMI 30.13 kg/m  Objective:   Physical Exam Constitutional:      Appearance: She is not ill-appearing.  HENT:     Right Ear: Tympanic membrane and ear canal normal.     Left Ear: Tympanic membrane and ear canal normal.     Nose:     Right Sinus: No maxillary sinus tenderness or frontal sinus tenderness.     Left Sinus: No maxillary sinus tenderness or frontal sinus tenderness.     Mouth/Throat:     Pharynx: No posterior oropharyngeal erythema.  Eyes:     Conjunctiva/sclera: Conjunctivae normal.  Cardiovascular:     Rate and Rhythm: Normal rate and regular rhythm.  Pulmonary:     Effort: Pulmonary effort is normal.     Breath sounds: Normal breath sounds. No wheezing or rales.     Comments: Slightly congested cough noted once during visit  Musculoskeletal:     Cervical back: Neck supple.  Lymphadenopathy:     Cervical: No cervical adenopathy.  Skin:    General: Skin is warm and dry.           Assessment & Plan:   Problem List Items Addressed This Visit       Other   Acute cough - Primary    Suspect viral etiology, especially since no improvement post 7 days of Augmentin. Could be post viral URI cough.   Chest x-ray pending.   Recommend Zyrtec once daily.  Rx for Cheratussin sent to pharmacy. Drowsiness precautions provided.   Resume Flonase twice  daily. Refill sent.  Await results.  I evaluated patient, was consulted regarding treatment, and agree with assessment and plan per Tinnie Gens, RN, DNP student.   Allie Bossier, NP-C       Relevant Medications   guaiFENesin-codeine (ROBITUSSIN AC) 100-10 MG/5ML syrup   fluticasone (FLONASE) 50 MCG/ACT nasal spray   Other Relevant Orders   DG Chest 2 View  Pleas Koch, NP

## 2022-04-07 NOTE — Progress Notes (Signed)
Established Patient Office Visit  Subjective   Patient ID: Raven Romero, female    DOB: 03-25-1961  Age: 61 y.o. MRN: [id number]  Chief Complaint  Patient presents with   Cough    On and off for 1 month. Worsened in the last week.  Productive cough, thick green/yellow mucous.     HPI  Raven Romero is a 61 year old female with past medical history of HTN, GERD, dyslipidemia and prediabetes presents today for an acute visit.   Her symptoms started month ago. She took care of her grandchildren a month ago and one of the grandchildren had a cough. Symptoms include productive cough with thick green/yellow mucous. She has sore throat, congestion and some sinus pressure. It hurts to cough and is worse during the day than at night. Bilateral Ear pain intermittently. She had some dizziness this morning. She had a virtual visit with [facility] around 03/20/22 and she was prescribed tessalon pearls and Augmentin for seven days. She has minimal relief with that and feels like it goes away and comes back. Had fever and chills onset of the symptoms but hasn't had it again. She did a home Covid test which was negative. Denies nausea, vomiting, chest pain, chest pressure or shortness of breath. She has never been a smoker. She is taking OTC cough syrup at night which gives her some relief. She has increased her intake of vitamin C. Lozenges provide relief. She is using the Flonase with no relief.    Patient Active Problem List   Diagnosis Date Noted   Acute cough 04/07/2022   Itching 12/01/2020   Prediabetes 12/01/2020   Kidney stone 04/10/2020   Preventative health care 04/10/2019   Morphea 04/10/2019   GERD (gastroesophageal reflux disease) 04/10/2019   Hyperglycemia 67/34/1937   Lichen sclerosus 90/24/0973   Age-related nuclear cataract of both eyes 06/06/2017   Hypertensive retinopathy of both eyes 06/06/2017   Cervical spondylosis 01/16/2017   Dyslipidemia (high LDL; low HDL) 12/10/2014   Essential  hypertension 12/10/2014   Mixed incontinence 10/26/2012   Past Medical History:  Diagnosis Date   Breast mass, right    Cervical polyp 04/09/2015   2 small polyps on ultrasound, smaller on [place] 05/18/21.   Hypertension    Kidney stones    Lichen sclerosus    Long-term use of Plaquenil 06/06/2017   Morphea    Shingles    Past Surgical History:  Procedure Laterality Date   BREAST EXCISIONAL BIOPSY Right 06/2015   BREAST LUMPECTOMY WITH RADIOACTIVE SEED LOCALIZATION Right 06/25/2015   Procedure: RIGHT BREAST SEED LOCALIZATION LUMPECTOMY ;  Surgeon: Erroll Luna, MD;  Location: [facility];  Service: General;  Laterality: Right;   CYSTECTOMY  1999   ovarian cystectomy   EXTRACORPOREAL SHOCK WAVE LITHOTRIPSY Left 06/17/2019   Procedure: EXTRACORPOREAL SHOCK WAVE LITHOTRIPSY (ESWL);  Surgeon: Raynelle Bring, MD;  Location: [facility];  Service: Urology;  Laterality: Left;   fractured arm Right 01/04/2021   NASAL SINUS SURGERY     TUBAL LIGATION     BTSP   Social History   Tobacco Use   Smoking status: Never   Smokeless tobacco: Never  Vaping Use   Vaping Use: Never used  Substance Use Topics   Alcohol use: Yes    Alcohol/week: 3.0 standard drinks of alcohol    Types: 3 Glasses of wine per week    Comment: weekly   Drug use: No   Family History  Problem Relation Age of  Onset   Hypertension Mother    Breast cancer Mother    Heart disease Mother    Diabetes Father    Heart disease Father    Diabetes Paternal Grandmother    Diabetes Paternal Grandfather    Allergies  Allergen Reactions   Flagyl [Metronidazole] Nausea And Vomiting   Macrobid  [Nitrofurantoin] Nausea Only   Other     Medrol Dosepak - pt stated, "Made face feel like it was burning from the inside out; face was blood red"      Review of Systems  Constitutional:  Negative for chills and fever.  HENT:  Positive for ear pain, sinus pain and sore throat. Negative for ear discharge.   Eyes:   Negative for blurred vision and double vision.  Respiratory:  Positive for cough. Negative for shortness of breath and wheezing.   Cardiovascular:  Negative for chest pain, palpitations and orthopnea.  Gastrointestinal:  Negative for heartburn, nausea and vomiting.  Neurological:  Positive for dizziness.      Objective:     BP 100/68   Pulse 80   Temp (!) 97.3 F (36.3 C) (Temporal)   Ht 4' 11.75" (1.518 m)   Wt 153 lb (69.4 kg)   LMP 07/18/2010 (Approximate)   SpO2 99%   BMI 30.13 kg/m  BP Readings from Last 3 Encounters:  04/07/22 100/68  03/08/22 100/64  01/24/22 114/70   Wt Readings from Last 3 Encounters:  04/07/22 153 lb (69.4 kg)  03/08/22 149 lb (67.6 kg)  01/24/22 150 lb (68 kg)      Physical Exam Vitals reviewed.  Constitutional:      Appearance: Normal appearance.  HENT:     Right Ear: Tympanic membrane, ear canal and external ear normal.     Left Ear: Tympanic membrane, ear canal and external ear normal.     Nose: Congestion present.     Right Sinus: Maxillary sinus tenderness and frontal sinus tenderness present.     Left Sinus: Maxillary sinus tenderness and frontal sinus tenderness present.     Mouth/Throat:     Mouth: Mucous membranes are moist.     Pharynx: Oropharynx is clear.  Cardiovascular:     Rate and Rhythm: Normal rate and regular rhythm.     Pulses: Normal pulses.     Heart sounds: Normal heart sounds.  Pulmonary:     Effort: Pulmonary effort is normal.     Breath sounds: Normal breath sounds.  Neurological:     Mental Status: She is alert and oriented to person, place, and time.      No results found for any visits on 04/07/22.     The 10-year ASCVD risk score (Arnett DK, et al., 2019) is: 2.6%    Assessment & Plan:   Problem List Items Addressed This Visit       Other   Acute cough - Primary    Unclear etiology. Suspect post viral URI cough.   Chest x-ray pending.   Recommend Zyrtec once daily.   Continue  Flonase daily. Refill sent.  Discussed the importance of increasing fluid intake.       Relevant Medications   guaiFENesin-codeine (ROBITUSSIN AC) 100-10 MG/5ML syrup   fluticasone (FLONASE) 50 MCG/ACT nasal spray   Other Relevant Orders   DG Chest 2 View    No follow-ups on file.    Modesto Charon, BSN-RN, DNP STUDENT

## 2022-04-08 ENCOUNTER — Other Ambulatory Visit: Payer: Self-pay | Admitting: Primary Care

## 2022-04-08 DIAGNOSIS — R051 Acute cough: Secondary | ICD-10-CM

## 2022-04-08 MED ORDER — AZITHROMYCIN 250 MG PO TABS
ORAL_TABLET | ORAL | 0 refills | Status: DC
Start: 2022-04-08 — End: 2022-05-03

## 2022-04-26 ENCOUNTER — Encounter: Payer: BC Managed Care – PPO | Admitting: Primary Care

## 2022-05-03 ENCOUNTER — Encounter: Payer: Self-pay | Admitting: Primary Care

## 2022-05-03 ENCOUNTER — Ambulatory Visit (INDEPENDENT_AMBULATORY_CARE_PROVIDER_SITE_OTHER): Payer: BC Managed Care – PPO | Admitting: Primary Care

## 2022-05-03 VITALS — BP 122/86 | HR 80 | Temp 97.2°F | Ht 59.75 in | Wt 153.0 lb

## 2022-05-03 DIAGNOSIS — I10 Essential (primary) hypertension: Secondary | ICD-10-CM

## 2022-05-03 DIAGNOSIS — L940 Localized scleroderma [morphea]: Secondary | ICD-10-CM | POA: Diagnosis not present

## 2022-05-03 DIAGNOSIS — Z0000 Encounter for general adult medical examination without abnormal findings: Secondary | ICD-10-CM

## 2022-05-03 DIAGNOSIS — R7303 Prediabetes: Secondary | ICD-10-CM

## 2022-05-03 DIAGNOSIS — Z23 Encounter for immunization: Secondary | ICD-10-CM | POA: Diagnosis not present

## 2022-05-03 DIAGNOSIS — H35033 Hypertensive retinopathy, bilateral: Secondary | ICD-10-CM

## 2022-05-03 DIAGNOSIS — K219 Gastro-esophageal reflux disease without esophagitis: Secondary | ICD-10-CM

## 2022-05-03 DIAGNOSIS — L900 Lichen sclerosus et atrophicus: Secondary | ICD-10-CM

## 2022-05-03 DIAGNOSIS — E785 Hyperlipidemia, unspecified: Secondary | ICD-10-CM | POA: Diagnosis not present

## 2022-05-03 LAB — LIPID PANEL
Cholesterol: 191 mg/dL (ref 0–200)
HDL: 78.9 mg/dL (ref >39.00)
LDL Cholesterol: 93 mg/dL (ref 0–99)
NonHDL: 112.16
Total CHOL/HDL Ratio: 2
Triglycerides: 95 mg/dL (ref 0.0–149.0)
VLDL: 19 mg/dL (ref 0.0–40.0)

## 2022-05-03 LAB — HEMOGLOBIN A1C: Hgb A1c MFr Bld: 4.9 % (ref 4.6–6.5)

## 2022-05-03 NOTE — Assessment & Plan Note (Signed)
Controlled.  Continue olmesartan 40 mg daily. BMP reviewed from July 2023.

## 2022-05-03 NOTE — Assessment & Plan Note (Signed)
Repeat A1C pending. 

## 2022-05-03 NOTE — Addendum Note (Signed)
Addended by: Josem Kaufmann C on: 05/03/2022 11:03 AM   Modules accepted: Orders

## 2022-05-03 NOTE — Assessment & Plan Note (Signed)
Following with GI.   Continue omeprazole 40 mg daily, recommended she discuss a dose decrease to 20 mg.

## 2022-05-03 NOTE — Assessment & Plan Note (Signed)
Commended her on weight loss!!! Encouraged to continue.  Repeat lipid panel pending

## 2022-05-03 NOTE — Assessment & Plan Note (Addendum)
Influenza and tetanus vaccines due, provided today. Shingrix deferred for next visit. Pap smear UTD Mammogram UTD and scheduled. Colonoscopy due, scheduled for 05/18/22.  Discussed the importance of a healthy diet and regular exercise in order for weight loss, and to reduce the risk of further co-morbidity.  Exam stable. Labs pending.  Follow up in 1 year for repeat physical.

## 2022-05-03 NOTE — Assessment & Plan Note (Signed)
Controlled. No concerns today.  Continue Estrace cream 0.1 mg/gm PRN.

## 2022-05-03 NOTE — Progress Notes (Signed)
Subjective:    Patient ID: Raven Romero, female    DOB: March 14, 1961, 61 y.o.   MRN: [id number]  HPI  Raven Romero is a very pleasant 61 y.o. female who presents today for complete physical and follow up of chronic conditions.  Immunizations: -Tetanus: 2013 -Influenza: Due -Shingles: Never completed   Diet: Fair diet.  Exercise: No regular exercise. Some walking.   Eye exam: Completes annually  Dental exam: Completes semi-annually   Pap Smear: Completed in 2022 Mammogram: Completed in December 2021, scheduled for December 2023  Colonoscopy: Completed in 2018, due 2023. Scheduled for 05/18/22.  BP Readings from Last 3 Encounters:  05/03/22 122/86  04/07/22 100/68  03/08/22 100/64   Wt Readings from Last 3 Encounters:  05/03/22 153 lb (69.4 kg)  04/07/22 153 lb (69.4 kg)  03/08/22 149 lb (67.6 kg)       Review of Systems  Constitutional:  Negative for unexpected weight change.  HENT:  Negative for rhinorrhea.   Respiratory:  Negative for cough and shortness of breath.   Cardiovascular:  Negative for chest pain.  Gastrointestinal:  Negative for constipation and diarrhea.  Genitourinary:  Negative for difficulty urinating and menstrual problem.  Musculoskeletal:  Negative for arthralgias and myalgias.  Skin:  Negative for rash.  Allergic/Immunologic: Negative for environmental allergies.  Neurological:  Negative for dizziness and headaches.  Psychiatric/Behavioral:  The patient is not nervous/anxious.          Past Medical History:  Diagnosis Date   Breast mass, right    Cervical polyp 04/09/2015   2 small polyps on ultrasound, smaller on [place] 05/18/21.   Hypertension    Kidney stone 04/10/2020   Kidney stones    Lichen sclerosus    Long-term use of Plaquenil 06/06/2017   Morphea    Shingles     Social History   Socioeconomic History   Marital status: Married    Spouse name: Not on file   Number of children: Not on file   Years of education: Not  on file   Highest education level: Not on file  Occupational History   Not on file  Tobacco Use   Smoking status: Never   Smokeless tobacco: Never  Vaping Use   Vaping Use: Never used  Substance and Sexual Activity   Alcohol use: Yes    Alcohol/week: 3.0 standard drinks of alcohol    Types: 3 Glasses of wine per week    Comment: weekly   Drug use: No   Sexual activity: Yes    Partners: Male    Birth control/protection: Surgical    Comment: btsp  Other Topics Concern   Not on file  Social History Narrative   Married.    1 child, 2 grandchildren.   Works in Science writer.   Enjoys traveling to the beach.    Social Determinants of Health   Financial Resource Strain: Not on file  Food Insecurity: Not on file  Transportation Needs: Not on file  Physical Activity: Not on file  Stress: Not on file  Social Connections: Not on file  Intimate Partner Violence: Not on file    Past Surgical History:  Procedure Laterality Date   BREAST EXCISIONAL BIOPSY Right 06/2015   BREAST LUMPECTOMY WITH RADIOACTIVE SEED LOCALIZATION Right 06/25/2015   Procedure: RIGHT BREAST SEED LOCALIZATION LUMPECTOMY ;  Surgeon: Erroll Luna, MD;  Location: [facility];  Service: General;  Laterality: Right;   CYSTECTOMY  1999   ovarian cystectomy  EXTRACORPOREAL SHOCK WAVE LITHOTRIPSY Left 06/17/2019   Procedure: EXTRACORPOREAL SHOCK WAVE LITHOTRIPSY (ESWL);  Surgeon: Heloise Purpura, MD;  Location: [facility];  Service: Urology;  Laterality: Left;   fractured arm Right 01/04/2021   NASAL SINUS SURGERY     TUBAL LIGATION     BTSP    Family History  Problem Relation Age of Onset   Hypertension Mother    Breast cancer Mother    Heart disease Mother    Diabetes Father    Heart disease Father    Diabetes Paternal Grandmother    Diabetes Paternal Grandfather     Allergies  Allergen Reactions   Flagyl [Metronidazole] Nausea And Vomiting   Macrobid  [Nitrofurantoin] Nausea Only   Other      Medrol Dosepak - pt stated, "Made face feel like it was burning from the inside out; face was blood red"    Current Outpatient Medications on File Prior to Visit  Medication Sig Dispense Refill   desonide (DESOWEN) 0.05 % cream desonide 0.05 % topical cream     estradiol (ESTRACE) 0.1 MG/GM vaginal cream USE 1/2 GRAM VAGINALLY 2-3 TIMES PER WEEK TO MAINTAIN SYMPTOM RELIEF AS DIRECTED 42.5 g 1   fluticasone (FLONASE) 50 MCG/ACT nasal spray SHAKE LIQUID AND USE 1 SPRAY IN EACH NOSTRIL TWICE DAILY 48 g 0   olmesartan (BENICAR) 40 MG tablet Take 1 tablet (40 mg total) by mouth daily. for blood pressure. Office visit required for further refills. 90 tablet 0   omeprazole (PRILOSEC) 40 MG capsule Take 1 tablet by mouth daily.     No current facility-administered medications on file prior to visit.    BP 122/86   Pulse 80   Temp (!) 97.2 F (36.2 C) (Temporal)   Ht 4' 11.75" (1.518 m)   Wt 153 lb (69.4 kg)   LMP 07/18/2010 (Approximate)   SpO2 99%   BMI 30.13 kg/m  Objective:   Physical Exam HENT:     Right Ear: Tympanic membrane and ear canal normal.     Left Ear: Tympanic membrane and ear canal normal.     Nose: Nose normal.  Eyes:     Conjunctiva/sclera: Conjunctivae normal.     Pupils: Pupils are equal, round, and reactive to light.  Neck:     Thyroid: No thyromegaly.  Cardiovascular:     Rate and Rhythm: Normal rate and regular rhythm.     Heart sounds: No murmur heard. Pulmonary:     Effort: Pulmonary effort is normal.     Breath sounds: Normal breath sounds. No rales.  Abdominal:     General: Bowel sounds are normal.     Palpations: Abdomen is soft.     Tenderness: There is no abdominal tenderness.  Musculoskeletal:        General: Normal range of motion.     Cervical back: Neck supple.  Lymphadenopathy:     Cervical: No cervical adenopathy.  Skin:    General: Skin is warm and dry.     Findings: No rash.  Neurological:     Mental Status: She is alert and  oriented to person, place, and time.     Cranial Nerves: No cranial nerve deficit.     Deep Tendon Reflexes: Reflexes are normal and symmetric.  Psychiatric:        Mood and Affect: Mood normal.           Assessment & Plan:   Problem List Items Addressed This Visit  Cardiovascular and Mediastinum   Essential hypertension    Controlled.  Continue olmesartan 40 mg daily. BMP reviewed from July 2023.        Digestive   GERD (gastroesophageal reflux disease)    Following with GI.   Continue omeprazole 40 mg daily, recommended she discuss a dose decrease to 20 mg.        Musculoskeletal and Integument   Morphea    Controlled. Following with dermatology.  No longer on treatment.         Other   Dyslipidemia (high LDL; low HDL)    Commended her on weight loss!!! Encouraged to continue.  Repeat lipid panel pending      Relevant Orders   Lipid panel   Hypertensive retinopathy of both eyes    Following with [facility].       Lichen sclerosus    Controlled. No concerns today.  Continue Estrace cream 0.1 mg/gm PRN.      Preventative health care - Primary    Influenza and tetanus vaccines due, provided today. Shingrix deferred for next visit. Pap smear UTD Mammogram UTD and scheduled. Colonoscopy due, scheduled for 05/18/22.  Discussed the importance of a healthy diet and regular exercise in order for weight loss, and to reduce the risk of further co-morbidity.  Exam stable. Labs pending.  Follow up in 1 year for repeat physical.       Prediabetes    Repeat A1C pending.      Relevant Orders   Hemoglobin A1c       Doreene Nest, NP

## 2022-05-03 NOTE — Assessment & Plan Note (Signed)
Controlled. Following with dermatology.  No longer on treatment.

## 2022-05-03 NOTE — Assessment & Plan Note (Signed)
Following with [facility].

## 2022-05-03 NOTE — Patient Instructions (Signed)
Stop by the lab prior to leaving today. I will notify you of your results once received.   Complete your colonoscopy and mammogram this year.  It was a pleasure to see you today!  Preventive Care 57-61 Years Old, Female Preventive care refers to lifestyle choices and visits with your health care provider that can promote health and wellness. Preventive care visits are also called wellness exams. What can I expect for my preventive care visit? Counseling Your health care provider may ask you questions about your: Medical history, including: Past medical problems. Family medical history. Pregnancy history. Current health, including: Menstrual cycle. Method of birth control. Emotional well-being. Home life and relationship well-being. Sexual activity and sexual health. Lifestyle, including: Alcohol, nicotine or tobacco, and drug use. Access to firearms. Diet, exercise, and sleep habits. Work and work Statistician. Sunscreen use. Safety issues such as seatbelt and bike helmet use. Physical exam Your health care provider will check your: Height and weight. These may be used to calculate your BMI (body mass index). BMI is a measurement that tells if you are at a healthy weight. Waist circumference. This measures the distance around your waistline. This measurement also tells if you are at a healthy weight and may help predict your risk of certain diseases, such as type 2 diabetes and high blood pressure. Heart rate and blood pressure. Body temperature. Skin for abnormal spots. What immunizations do I need?  Vaccines are usually given at various ages, according to a schedule. Your health care provider will recommend vaccines for you based on your age, medical history, and lifestyle or other factors, such as travel or where you work. What tests do I need? Screening Your health care provider may recommend screening tests for certain conditions. This may include: Lipid and cholesterol  levels. Diabetes screening. This is done by checking your blood sugar (glucose) after you have not eaten for a while (fasting). Pelvic exam and Pap test. Hepatitis B test. Hepatitis C test. HIV (human immunodeficiency virus) test. STI (sexually transmitted infection) testing, if you are at risk. Lung cancer screening. Colorectal cancer screening. Mammogram. Talk with your health care provider about when you should start having regular mammograms. This may depend on whether you have a family history of breast cancer. BRCA-related cancer screening. This may be done if you have a family history of breast, ovarian, tubal, or peritoneal cancers. Bone density scan. This is done to screen for osteoporosis. Talk with your health care provider about your test results, treatment options, and if necessary, the need for more tests. Follow these instructions at home: Eating and drinking  Eat a diet that includes fresh fruits and vegetables, whole grains, lean protein, and low-fat dairy products. Take vitamin and mineral supplements as recommended by your health care provider. Do not drink alcohol if: Your health care provider tells you not to drink. You are pregnant, may be pregnant, or are planning to become pregnant. If you drink alcohol: Limit how much you have to 0-1 drink a day. Know how much alcohol is in your drink. In the [place] one drink equals one 12 oz bottle of beer (355 mL), one 5 oz glass of wine (148 mL), or one 1 oz glass of hard liquor (44 mL). Lifestyle Brush your teeth every morning and night with fluoride toothpaste. Floss one time each day. Exercise for at least 30 minutes 5 or more days each week. Do not use any products that contain nicotine or tobacco. These products include cigarettes, chewing tobacco, and  vaping devices, such as e-cigarettes. If you need help quitting, ask your health care provider. Do not use drugs. If you are sexually active, practice safe sex. Use a  condom or other form of protection to prevent STIs. If you do not wish to become pregnant, use a form of birth control. If you plan to become pregnant, see your health care provider for a prepregnancy visit. Take aspirin only as told by your health care provider. Make sure that you understand how much to take and what form to take. Work with your health care provider to find out whether it is safe and beneficial for you to take aspirin daily. Find healthy ways to manage stress, such as: Meditation, yoga, or listening to music. Journaling. Talking to a trusted person. Spending time with friends and family. Minimize exposure to UV radiation to reduce your risk of skin cancer. Safety Always wear your seat belt while driving or riding in a vehicle. Do not drive: If you have been drinking alcohol. Do not ride with someone who has been drinking. When you are tired or distracted. While texting. If you have been using any mind-altering substances or drugs. Wear a helmet and other protective equipment during sports activities. If you have firearms in your house, make sure you follow all gun safety procedures. Seek help if you have been physically or sexually abused. What's next? Visit your health care provider once a year for an annual wellness visit. Ask your health care provider how often you should have your eyes and teeth checked. Stay up to date on all vaccines. This information is not intended to replace advice given to you by your health care provider. Make sure you discuss any questions you have with your health care provider. Document Revised: 12/30/2020 Document Reviewed: 12/30/2020 Elsevier Patient Education  [place].

## 2022-06-07 ENCOUNTER — Other Ambulatory Visit: Payer: Self-pay | Admitting: Primary Care

## 2022-06-07 DIAGNOSIS — I10 Essential (primary) hypertension: Secondary | ICD-10-CM

## 2022-06-20 ENCOUNTER — Telehealth: Payer: Self-pay | Admitting: Primary Care

## 2022-06-20 DIAGNOSIS — I10 Essential (primary) hypertension: Secondary | ICD-10-CM

## 2022-06-20 NOTE — Telephone Encounter (Signed)
Caller Name: Grecia Call back phone #: [phone]  MEDICATION(S):  Hydrochlorothiazide 25 mg tablets   Days of Med Remaining: 0  Has the patient contacted their pharmacy (YES/NO)? NO What did pharmacy advise?   Preferred Pharmacy:  [facility], [address]  ~~~Please advise patient/caregiver to allow 2-3 business days to process RX refills.

## 2022-06-20 NOTE — Telephone Encounter (Signed)
Per her chart she is not on hydrochlorothiazide for blood pressure, only olmesartan for BP.   What is she taking for BP control?

## 2022-06-21 MED ORDER — HYDROCHLOROTHIAZIDE 25 MG PO TABS: 25.0000 mg | ORAL_TABLET | Freq: Every day | ORAL | 2 refills | Status: DC

## 2022-06-21 NOTE — Telephone Encounter (Signed)
Noted, Rx sent to pharmacy. 

## 2022-06-21 NOTE — Addendum Note (Signed)
Addended by: Doreene Nest on: 06/21/2022 01:18 PM   Modules accepted: Orders

## 2022-06-21 NOTE — Telephone Encounter (Signed)
Called and spoke with patient, she is taking HCTZ 25 mg.  She accidentally marked that she was not taking this med on 04/07/22, she meant to mark that she was no longer taking hydroxychloroquine 200 mg.   She does need a refill on HCTZ 25 mg.

## 2022-06-29 DIAGNOSIS — Z1211 Encounter for screening for malignant neoplasm of colon: Secondary | ICD-10-CM | POA: Diagnosis not present

## 2022-06-29 DIAGNOSIS — D120 Benign neoplasm of cecum: Secondary | ICD-10-CM | POA: Diagnosis not present

## 2022-06-29 DIAGNOSIS — K635 Polyp of colon: Secondary | ICD-10-CM | POA: Diagnosis not present

## 2022-06-29 DIAGNOSIS — K5730 Diverticulosis of large intestine without perforation or abscess without bleeding: Secondary | ICD-10-CM | POA: Diagnosis not present

## 2022-06-29 LAB — HM COLONOSCOPY

## 2022-07-05 ENCOUNTER — Encounter: Payer: Self-pay | Admitting: Primary Care

## 2022-07-14 ENCOUNTER — Ambulatory Visit
Admission: RE | Admit: 2022-07-14 | Discharge: 2022-07-14 | Disposition: A | Discharge status: Home / Self Care | Payer: BC Managed Care – PPO | Source: Ambulatory Visit | Attending: Obstetrics and Gynecology | Admitting: Obstetrics and Gynecology

## 2022-07-14 DIAGNOSIS — Z1231 Encounter for screening mammogram for malignant neoplasm of breast: Secondary | ICD-10-CM

## 2022-09-26 ENCOUNTER — Ambulatory Visit (INDEPENDENT_AMBULATORY_CARE_PROVIDER_SITE_OTHER): Payer: BC Managed Care – PPO | Admitting: Podiatry

## 2022-09-26 ENCOUNTER — Ambulatory Visit (INDEPENDENT_AMBULATORY_CARE_PROVIDER_SITE_OTHER): Payer: BC Managed Care – PPO

## 2022-09-26 ENCOUNTER — Encounter: Payer: Self-pay | Admitting: Podiatry

## 2022-09-26 DIAGNOSIS — M7751 Other enthesopathy of right foot: Secondary | ICD-10-CM | POA: Diagnosis not present

## 2022-09-26 DIAGNOSIS — M722 Plantar fascial fibromatosis: Secondary | ICD-10-CM | POA: Diagnosis not present

## 2022-09-26 MED ORDER — TRIAMCINOLONE ACETONIDE 10 MG/ML IJ SUSP
10.0000 mg | Freq: Once | INTRAMUSCULAR | Status: AC
  Administered 2022-09-26: 10 mg

## 2022-09-26 NOTE — Progress Notes (Signed)
Subjective:   Patient ID: Raven Romero, female   DOB: 62 y.o.   MRN: [id number]   HPI Patient states she has developed a lot of pain in the forefoot right for the last few weeks and does not remember injury.  States it is very sore when she walks on it   ROS      Objective:  Physical Exam  Neurovascular status intact with inflammation of the fourth MPJ right with the possibility for some nerve irritation also third interspace with no current heel pain noted     Assessment:  Inflammatory capsulitis of the fourth MPJ right with some interspace involvement     Plan:  H&P reviewed x-rays today I did a forefoot prep right I then aspirated the fourth MPJ getting out a small amount of clear fluid injected quarter cc dexamethasone Kenalog applied thick plantar pad advised on rigid bottom shoes reappoint to recheck in several weeks  X-rays were negative for signs of fracture associated with this appears to be soft tissue

## 2022-09-29 DIAGNOSIS — Z79899 Other long term (current) drug therapy: Secondary | ICD-10-CM | POA: Diagnosis not present

## 2022-10-10 ENCOUNTER — Ambulatory Visit (INDEPENDENT_AMBULATORY_CARE_PROVIDER_SITE_OTHER): Payer: BC Managed Care – PPO | Admitting: Podiatry

## 2022-10-10 DIAGNOSIS — M7751 Other enthesopathy of right foot: Secondary | ICD-10-CM | POA: Diagnosis not present

## 2022-10-10 MED ORDER — DICLOFENAC SODIUM 75 MG PO TBEC: 75.0000 mg | DELAYED_RELEASE_TABLET | Freq: Two times a day (BID) | ORAL | 2 refills | Status: DC

## 2022-10-10 NOTE — Progress Notes (Signed)
Subjective:   Patient ID: Raven Romero, female   DOB: 62 y.o.   MRN: [id number]   HPI Patient states right foot is doing well with diminished discomfort still moderate pain   ROS      Objective:  Physical Exam  Neurovascular status intact with diminishment of discomfort around the right fourth MPJ fluid buildup around the joint surface of the low level nature     Assessment:  Improved capsulitis right     Plan:  Reviewed the continuation of rigid bottom shoes dispensed more padding and discussed the possibility at 1 point for osteotomy if symptoms were to persist.  May also consider orthotics.  Patient will be seen back as needed at this point I am satisfied

## 2023-01-30 DIAGNOSIS — L900 Lichen sclerosus et atrophicus: Secondary | ICD-10-CM | POA: Diagnosis not present

## 2023-01-30 DIAGNOSIS — L940 Localized scleroderma [morphea]: Secondary | ICD-10-CM | POA: Diagnosis not present

## 2023-01-30 DIAGNOSIS — D2260 Melanocytic nevi of unspecified upper limb, including shoulder: Secondary | ICD-10-CM | POA: Diagnosis not present

## 2023-01-30 DIAGNOSIS — L219 Seborrheic dermatitis, unspecified: Secondary | ICD-10-CM | POA: Diagnosis not present

## 2023-02-10 ENCOUNTER — Other Ambulatory Visit: Payer: Self-pay | Admitting: Podiatry

## 2023-03-16 ENCOUNTER — Other Ambulatory Visit: Payer: Self-pay

## 2023-03-16 DIAGNOSIS — I10 Essential (primary) hypertension: Secondary | ICD-10-CM

## 2023-03-17 MED ORDER — HYDROCHLOROTHIAZIDE 25 MG PO TABS
25.0000 mg | ORAL_TABLET | Freq: Every day | ORAL | 0 refills | Status: DC
Start: 2023-03-17 — End: 2023-06-22

## 2023-03-22 ENCOUNTER — Other Ambulatory Visit: Payer: Self-pay | Admitting: Primary Care

## 2023-03-22 DIAGNOSIS — I10 Essential (primary) hypertension: Secondary | ICD-10-CM

## 2023-05-10 ENCOUNTER — Encounter: Payer: Self-pay | Admitting: Primary Care

## 2023-05-12 ENCOUNTER — Ambulatory Visit: Payer: BC Managed Care – PPO | Admitting: Podiatry

## 2023-06-20 ENCOUNTER — Other Ambulatory Visit: Payer: Self-pay

## 2023-06-20 DIAGNOSIS — K219 Gastro-esophageal reflux disease without esophagitis: Secondary | ICD-10-CM

## 2023-06-21 MED ORDER — OMEPRAZOLE 40 MG PO CPDR
40.0000 mg | DELAYED_RELEASE_CAPSULE | Freq: Every day | ORAL | 0 refills | Status: DC
Start: 2023-06-21 — End: 2023-09-20

## 2023-06-22 ENCOUNTER — Other Ambulatory Visit: Payer: Self-pay

## 2023-06-22 ENCOUNTER — Other Ambulatory Visit: Payer: Self-pay | Admitting: Primary Care

## 2023-06-22 ENCOUNTER — Telehealth: Payer: Self-pay | Admitting: Primary Care

## 2023-06-22 DIAGNOSIS — Z1231 Encounter for screening mammogram for malignant neoplasm of breast: Secondary | ICD-10-CM

## 2023-06-22 DIAGNOSIS — I10 Essential (primary) hypertension: Secondary | ICD-10-CM

## 2023-06-22 MED ORDER — HYDROCHLOROTHIAZIDE 25 MG PO TABS
25.0000 mg | ORAL_TABLET | Freq: Every day | ORAL | 0 refills | Status: DC
Start: 2023-06-22 — End: 2023-07-27

## 2023-06-22 NOTE — Telephone Encounter (Signed)
Refill sent to pharmacy.   

## 2023-06-22 NOTE — Telephone Encounter (Signed)
Prescription Request  06/22/2023  LOV: Visit date not found  What is the name of the medication or equipment? hydrochlorothiazide (HYDRODIURIL) 25 MG tablet   Have you contacted your pharmacy to request a refill? No   Which pharmacy would you like this sent to?  [id number] - [place], [place] - [address] AT [facility] [address] [facility] [address] [place] [address] Phone: [phone] Fax: [phone]    Patient notified that their request is being sent to the clinical staff for review and that they should receive a response within 2 business days.   Please advise at Mobile [phone] (mobile)

## 2023-06-26 ENCOUNTER — Encounter: Payer: Self-pay | Admitting: Podiatry

## 2023-06-26 ENCOUNTER — Ambulatory Visit (INDEPENDENT_AMBULATORY_CARE_PROVIDER_SITE_OTHER): Payer: No Typology Code available for payment source | Admitting: Podiatry

## 2023-06-26 VITALS — Ht 59.5 in | Wt 153.0 lb

## 2023-06-26 DIAGNOSIS — M7751 Other enthesopathy of right foot: Secondary | ICD-10-CM

## 2023-06-26 DIAGNOSIS — M722 Plantar fascial fibromatosis: Secondary | ICD-10-CM | POA: Diagnosis not present

## 2023-06-26 NOTE — Progress Notes (Signed)
Subjective:   Patient ID: Raven Romero, female   DOB: 62 y.o.   MRN: [id number]   HPI I have had a significant reoccurrence of heel pain in my right foot and states that it has gotten very tender and making it hard to walk and also it is the heel now but the forefoot still hurts   ROS      Objective:  Physical Exam  Neurovascular status intact exquisite discomfort medial fascial band right within the plantar fascia along with moderate discomfort fourth MPJ right foot     Assessment:  Acute reoccurrence Planter fasciitis right and forefoot inflammation right     Plan:  H&P reviewed and I do think she will need a new orthotic as it is almost 62 years old.  Patient will be scheduled with pedorthist for orthotics and I have recommended a deep heel seat with cushioning and offloading the fourth MPJ on the right foot.  Today I did sterile prep and injected the fascia 3 mg Kenalog 5 mg Xylocaine at insertion

## 2023-07-21 ENCOUNTER — Ambulatory Visit: Payer: Self-pay

## 2023-07-27 ENCOUNTER — Other Ambulatory Visit: Payer: Self-pay | Admitting: Primary Care

## 2023-07-27 ENCOUNTER — Ambulatory Visit: Payer: No Typology Code available for payment source

## 2023-07-27 DIAGNOSIS — I10 Essential (primary) hypertension: Secondary | ICD-10-CM

## 2023-07-27 NOTE — Progress Notes (Signed)
 Orthotics   Patient was present and evaluated for Custom molded foot orthotics. Patient will benefit from CFO's to provide total contact to BIL MLA's helping to balance and distribute body weight more evenly across BIL feet helping to reduce plantar pressure and pain. Orthotic will also encourage FF / RF alignment  Patient was scanned today and will return for fitting upon receipt

## 2023-08-03 ENCOUNTER — Ambulatory Visit (INDEPENDENT_AMBULATORY_CARE_PROVIDER_SITE_OTHER): Payer: Self-pay | Admitting: Primary Care

## 2023-08-03 ENCOUNTER — Encounter: Payer: Self-pay | Admitting: Primary Care

## 2023-08-03 ENCOUNTER — Ambulatory Visit: Payer: Self-pay

## 2023-08-03 VITALS — BP 126/68 | HR 105 | Temp 97.8°F | Ht 60.5 in | Wt 158.4 lb

## 2023-08-03 DIAGNOSIS — H35033 Hypertensive retinopathy, bilateral: Secondary | ICD-10-CM

## 2023-08-03 DIAGNOSIS — R7303 Prediabetes: Secondary | ICD-10-CM

## 2023-08-03 DIAGNOSIS — L900 Lichen sclerosus et atrophicus: Secondary | ICD-10-CM

## 2023-08-03 DIAGNOSIS — I10 Essential (primary) hypertension: Secondary | ICD-10-CM

## 2023-08-03 DIAGNOSIS — Z0000 Encounter for general adult medical examination without abnormal findings: Secondary | ICD-10-CM

## 2023-08-03 DIAGNOSIS — K219 Gastro-esophageal reflux disease without esophagitis: Secondary | ICD-10-CM

## 2023-08-03 DIAGNOSIS — E785 Hyperlipidemia, unspecified: Secondary | ICD-10-CM | POA: Diagnosis not present

## 2023-08-03 NOTE — Assessment & Plan Note (Signed)
Controlled.  Continue hydrochlorothiazide 25 mg daily and olmesartan 40 mg daily. CMP pending.

## 2023-08-03 NOTE — Assessment & Plan Note (Signed)
Repeat A1c pending. 

## 2023-08-03 NOTE — Progress Notes (Signed)
Subjective:    Patient ID: Raven Romero, female    DOB: [birth year], 63 y.o.   MRN: [id number]  HPI  Raven Romero is a very pleasant 63 y.o. female who presents today for complete physical and follow up of chronic conditions.  Immunizations: -Tetanus: Completed in 2023 -Influenza: Declines influenza vaccine.  -Shingles: Never completed, declines   Diet: Fair diet.  Exercise: No regular exercise.  Eye exam: Completes annually  Dental exam: Completes semi-annually    Pap Smear: Completed June 2022 per GYN Mammogram: Completed last in December 2023, scheduled for January 2025  Colonoscopy: Completed in 2023, due 2028   BP Readings from Last 3 Encounters:  08/03/23 126/68  05/03/22 122/86  04/07/22 100/68      Review of Systems  Constitutional:  Negative for unexpected weight change.  HENT:  Negative for rhinorrhea.   Respiratory:  Negative for cough and shortness of breath.   Cardiovascular:  Negative for chest pain.  Gastrointestinal:  Negative for constipation and diarrhea.  Genitourinary:  Negative for difficulty urinating and menstrual problem.  Musculoskeletal:  Negative for arthralgias and myalgias.  Skin:  Negative for rash.  Allergic/Immunologic: Negative for environmental allergies.  Neurological:  Negative for dizziness, numbness and headaches.  Psychiatric/Behavioral:  The patient is not nervous/anxious.          Past Medical History:  Diagnosis Date   Breast mass, right    Cervical polyp 04/09/2015   2 small polyps on ultrasound, smaller on [place] 05/18/21.   Hypertension    Kidney stone 04/10/2020   Kidney stones    Lichen sclerosus    Long-term use of Plaquenil 06/06/2017   Morphea    Shingles     Social History   Socioeconomic History   Marital status: Married    Spouse name: Not on file   Number of children: Not on file   Years of education: Not on file   Highest education level: Not on file  Occupational History   Not on file   Tobacco Use   Smoking status: Never   Smokeless tobacco: Never  Vaping Use   Vaping status: Never Used  Substance and Sexual Activity   Alcohol use: Yes    Alcohol/week: 3.0 standard drinks of alcohol    Types: 3 Glasses of wine per week    Comment: weekly   Drug use: No   Sexual activity: Yes    Partners: Male    Birth control/protection: Surgical    Comment: btsp  Other Topics Concern   Not on file  Social History Narrative   Married.    1 child, 2 grandchildren.   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   Enjoys traveling to the beach.    Social Drivers of Corporate investment banker Strain: Not on file  Food Insecurity: Not on file  Transportation Needs: Not on file  Physical Activity: Not on file  Stress: Not on file  Social Connections: Not on file  Intimate Partner Violence: Not on file    Past Surgical History:  Procedure Laterality Date   BREAST EXCISIONAL BIOPSY Right 06/2015   BREAST LUMPECTOMY WITH RADIOACTIVE SEED LOCALIZATION Right 06/25/2015   Procedure: RIGHT BREAST SEED LOCALIZATION LUMPECTOMY ;  Surgeon: Harriette Bouillon, MD;  Location: [facility];  Service: General;  Laterality: Right;   CYSTECTOMY  1999   ovarian cystectomy   EXTRACORPOREAL SHOCK WAVE LITHOTRIPSY Left 06/17/2019   Procedure: EXTRACORPOREAL SHOCK WAVE LITHOTRIPSY (ESWL);  Surgeon: Heloise Purpura, MD;  Location: [facility];  Service: Urology;  Laterality: Left;   fractured arm Right 01/04/2021   NASAL SINUS SURGERY     TUBAL LIGATION     BTSP    Family History  Problem Relation Age of Onset   Hypertension Mother    Breast cancer Mother    Heart disease Mother    Diabetes Father    Heart disease Father    Diabetes Paternal Grandmother    Diabetes Paternal Grandfather     Allergies  Allergen Reactions   Flagyl [Metronidazole] Nausea And Vomiting   Macrobid  [Nitrofurantoin] Nausea Only   Other     Medrol Dosepak - pt stated, "Made face feel like it was burning from the inside out;  face was blood red"    Current Outpatient Medications on File Prior to Visit  Medication Sig Dispense Refill   estradiol (ESTRACE) 0.1 MG/GM vaginal cream USE 1/2 GRAM VAGINALLY 2-3 TIMES PER WEEK TO MAINTAIN SYMPTOM RELIEF AS DIRECTED 42.5 g 1   hydrochlorothiazide (HYDRODIURIL) 25 MG tablet TAKE 1 TABLET(25 MG) BY MOUTH DAILY FOR BLOOD PRESSURE 30 tablet 0   olmesartan (BENICAR) 40 MG tablet TAKE 1 TABLET(40 MG) BY MOUTH DAILY FOR BLOOD PRESSURE 90 tablet 0   omeprazole (PRILOSEC) 40 MG capsule Take 1 capsule (40 mg total) by mouth daily. for heartburn. 90 capsule 0   No current facility-administered medications on file prior to visit.    BP 126/68   Pulse (!) 105   Temp 97.8 F (36.6 C) (Oral)   Ht 5' 0.5" (1.537 m)   Wt 158 lb 6 oz (71.8 kg)   LMP 07/18/2010 (Approximate)   SpO2 98%   BMI 30.42 kg/m  Objective:   Physical Exam HENT:     Right Ear: Tympanic membrane and ear canal normal.     Left Ear: Tympanic membrane and ear canal normal.  Eyes:     Pupils: Pupils are equal, round, and reactive to light.  Cardiovascular:     Rate and Rhythm: Normal rate and regular rhythm.  Pulmonary:     Effort: Pulmonary effort is normal.     Breath sounds: Normal breath sounds.  Abdominal:     General: Bowel sounds are normal.     Palpations: Abdomen is soft.     Tenderness: There is no abdominal tenderness.  Musculoskeletal:        General: Normal range of motion.     Cervical back: Neck supple.  Skin:    General: Skin is warm and dry.  Neurological:     Mental Status: She is alert and oriented to person, place, and time.     Cranial Nerves: No cranial nerve deficit.     Deep Tendon Reflexes:     Reflex Scores:      Patellar reflexes are 2+ on the right side and 2+ on the left side. Psychiatric:        Mood and Affect: Mood normal.           Assessment & Plan:  Preventative health care Assessment & Plan: Declines influenza and Shingrix vaccines.  Pap smear  UTD. Mammogram scheduled  Colonoscopy UTD, due 2028  Discussed the importance of a healthy diet and regular exercise in order for weight loss, and to reduce the risk of further co-morbidity.  Exam stable. Labs pending.  Follow up in 1 year for repeat physical.   Orders: -     Hemoglobin A1c -     Lipid panel -  Comprehensive metabolic panel  Essential hypertension Assessment & Plan: Controlled.  Continue hydrochlorothiazide 25 mg daily and olmesartan 40 mg daily. CMP pending.  Orders: -     Comprehensive metabolic panel  Gastroesophageal reflux disease, unspecified whether esophagitis present Assessment & Plan: Controlled.  Continue omeprazole 40 mg daily.    Dyslipidemia (high LDL; low HDL) Assessment & Plan: Repeat lipid panel pending.  Orders: -     Lipid panel  Hypertensive retinopathy of both eyes Assessment & Plan: Following with ophthalmology.   Lichen sclerosus Assessment & Plan: Controlled.  Continue Estrace cream as needed.   Prediabetes Assessment & Plan: Repeat A1c pending.  Orders: -     Hemoglobin A1c        Doreene Nest, NP

## 2023-08-03 NOTE — Assessment & Plan Note (Signed)
Controlled.  Continue Estrace cream as needed.

## 2023-08-03 NOTE — Assessment & Plan Note (Signed)
Following with ophthalmology. 

## 2023-08-03 NOTE — Assessment & Plan Note (Signed)
Declines influenza and Shingrix vaccines.  Pap smear UTD. Mammogram scheduled  Colonoscopy UTD, due 2028  Discussed the importance of a healthy diet and regular exercise in order for weight loss, and to reduce the risk of further co-morbidity.  Exam stable. Labs pending.  Follow up in 1 year for repeat physical.

## 2023-08-03 NOTE — Patient Instructions (Signed)
Stop by the lab prior to leaving today. I will notify you of your results once received.   It was a pleasure to see you today!  

## 2023-08-03 NOTE — Assessment & Plan Note (Signed)
Controlled.  Continue omeprazole 40 mg daily. 

## 2023-08-03 NOTE — Assessment & Plan Note (Signed)
Repeat lipid panel pending. 

## 2023-08-04 LAB — COMPREHENSIVE METABOLIC PANEL
ALT: 12 U/L (ref 0–35)
AST: 19 U/L (ref 0–37)
Albumin: 4.7 g/dL (ref 3.5–5.2)
Alkaline Phosphatase: 77 U/L (ref 39–117)
BUN: 23 mg/dL (ref 6–23)
CO2: 29 meq/L (ref 19–32)
Calcium: 10 mg/dL (ref 8.4–10.5)
Chloride: 102 meq/L (ref 96–112)
Creatinine, Ser: 1.04 mg/dL (ref 0.40–1.20)
GFR: 57.61 mL/min — ABNORMAL LOW (ref >60.00)
Glucose, Bld: 113 mg/dL — ABNORMAL HIGH (ref 70–99)
Potassium: 3.4 meq/L — ABNORMAL LOW (ref 3.5–5.1)
Sodium: 142 meq/L (ref 135–145)
Total Bilirubin: 0.5 mg/dL (ref 0.2–1.2)
Total Protein: 7.2 g/dL (ref 6.0–8.3)

## 2023-08-04 LAB — LIPID PANEL
Cholesterol: 188 mg/dL (ref 0–200)
HDL: 63.9 mg/dL (ref >39.00)
LDL Cholesterol: 96 mg/dL (ref 0–99)
NonHDL: 123.96
Total CHOL/HDL Ratio: 3
Triglycerides: 140 mg/dL (ref 0.0–149.0)
VLDL: 28 mg/dL (ref 0.0–40.0)

## 2023-08-04 LAB — HEMOGLOBIN A1C: Hgb A1c MFr Bld: 5.1 % (ref 4.6–6.5)

## 2023-08-15 ENCOUNTER — Ambulatory Visit: Payer: Self-pay

## 2023-08-17 ENCOUNTER — Other Ambulatory Visit: Payer: Self-pay

## 2023-08-17 DIAGNOSIS — I10 Essential (primary) hypertension: Secondary | ICD-10-CM

## 2023-08-18 MED ORDER — OLMESARTAN MEDOXOMIL 40 MG PO TABS: 40.0000 mg | ORAL_TABLET | Freq: Every day | ORAL | 2 refills | Status: DC

## 2023-08-21 ENCOUNTER — Other Ambulatory Visit: Payer: Self-pay

## 2023-08-21 DIAGNOSIS — I10 Essential (primary) hypertension: Secondary | ICD-10-CM

## 2023-08-21 MED ORDER — HYDROCHLOROTHIAZIDE 25 MG PO TABS: 25.0000 mg | ORAL_TABLET | Freq: Every day | ORAL | 2 refills | Status: DC

## 2023-08-23 ENCOUNTER — Other Ambulatory Visit: Payer: Self-pay

## 2023-08-23 DIAGNOSIS — I10 Essential (primary) hypertension: Secondary | ICD-10-CM

## 2023-09-11 ENCOUNTER — Ambulatory Visit (INDEPENDENT_AMBULATORY_CARE_PROVIDER_SITE_OTHER): Payer: No Typology Code available for payment source

## 2023-09-11 DIAGNOSIS — M7751 Other enthesopathy of right foot: Secondary | ICD-10-CM | POA: Diagnosis not present

## 2023-09-11 DIAGNOSIS — M2142 Flat foot [pes planus] (acquired), left foot: Secondary | ICD-10-CM

## 2023-09-11 DIAGNOSIS — M722 Plantar fascial fibromatosis: Secondary | ICD-10-CM

## 2023-09-11 DIAGNOSIS — M2141 Flat foot [pes planus] (acquired), right foot: Secondary | ICD-10-CM

## 2023-09-11 NOTE — Progress Notes (Signed)
Patient presents today to pick up custom molded foot orthotics, diagnosed with PF by Dr. Charlsie Merles.   Orthotics were dispensed and fit was satisfactory. Reviewed instructions for break-in and wear. Written instructions given to patient.  Patient will follow up as needed.   Addison Bailey Cped, CFo, CFm

## 2023-09-20 ENCOUNTER — Other Ambulatory Visit: Payer: Self-pay

## 2023-09-20 DIAGNOSIS — K219 Gastro-esophageal reflux disease without esophagitis: Secondary | ICD-10-CM

## 2023-09-21 MED ORDER — OMEPRAZOLE 40 MG PO CPDR: 40.0000 mg | DELAYED_RELEASE_CAPSULE | Freq: Every day | ORAL | 0 refills | Status: DC

## 2023-09-26 ENCOUNTER — Encounter: Payer: Self-pay | Admitting: Family Medicine

## 2023-09-26 ENCOUNTER — Ambulatory Visit: Admitting: Family Medicine

## 2023-09-26 VITALS — BP 110/74 | HR 94 | Temp 98.1°F | Ht 60.5 in | Wt 159.2 lb

## 2023-09-26 DIAGNOSIS — R350 Frequency of micturition: Secondary | ICD-10-CM | POA: Diagnosis not present

## 2023-09-26 DIAGNOSIS — B9620 Unspecified Escherichia coli [E. coli] as the cause of diseases classified elsewhere: Secondary | ICD-10-CM

## 2023-09-26 DIAGNOSIS — N390 Urinary tract infection, site not specified: Secondary | ICD-10-CM | POA: Diagnosis not present

## 2023-09-26 HISTORY — DX: Unspecified Escherichia coli (E. coli) as the cause of diseases classified elsewhere: N39.0

## 2023-09-26 LAB — POC URINALSYSI DIPSTICK (AUTOMATED)
Bilirubin, UA: NEGATIVE
Glucose, UA: NEGATIVE
Ketones, UA: NEGATIVE
Nitrite, UA: NEGATIVE
Protein, UA: POSITIVE — AB
Spec Grav, UA: 1.025 (ref 1.010–1.025)
Urobilinogen, UA: 0.2 U/dL
pH, UA: 6 (ref 5.0–8.0)

## 2023-09-26 MED ORDER — AMOXICILLIN-POT CLAVULANATE 875-125 MG PO TABS: 1.0000 | ORAL_TABLET | Freq: Two times a day (BID) | ORAL | 0 refills | Status: DC

## 2023-09-26 MED ORDER — FLUCONAZOLE 150 MG PO TABS: 150.0000 mg | ORAL_TABLET | Freq: Once | ORAL | 0 refills | Status: AC

## 2023-09-26 NOTE — Addendum Note (Signed)
 Addended by: Damita Lack on: 09/26/2023 09:14 AM   Modules accepted: Orders

## 2023-09-26 NOTE — Assessment & Plan Note (Signed)
 Acute, Culture returned showing multidrug resistant Ecoli.  Likely continue UTI gien abn UA ... Send for culture and treat with Augmentin given sensitivity and SE to macrobid. Augmentin BID x 7-10 days. Pt also with vaginal itching.. likely mild yeast infeciton... treat with antifungal at end of antibiotics, may repeta dose x 1 if not resolved after 2 days.

## 2023-09-26 NOTE — Progress Notes (Signed)
 Patient ID: Raven Romero, female    DOB: 29-Apr-1961, 63 y.o.   MRN: [id number]  This visit was conducted in person.  BP 110/74 (BP Location: Left Arm, Patient Position: Sitting, Cuff Size: Normal)   Pulse 94   Temp 98.1 F (36.7 C) (Temporal)   Ht 5' 0.5" (1.537 m)   Wt 159 lb 4 oz (72.2 kg)   LMP 07/18/2010 (Approximate)   SpO2 98%   BMI 30.59 kg/m    CC:  Chief Complaint  Patient presents with   Urinary Frequency    X 3 weeks.  Seen at Urgent Care 09/17/23-Tx with Amoxicillin    Subjective:   HPI: Raven Romero is a 63 y.o. female presenting on 09/26/2023 for Urinary Frequency (X 3 weeks.  Seen at Urgent Care 09/17/23-Tx with Amoxicillin)  Recent UTI 09/17/2023.. urine culture showed EColi.. multidrug resistent  ( was resistant to ampicillin,cipro  Treated with leftover cipro ( 4 days once daily) and  Amox x  5 days SE to macrobid in past.  Urinary Frequency  This is a new problem. The current episode started 1 to 4 weeks ago. The quality of the pain is described as burning. The pain is at a severity of 2/10. The pain is mild. There has been no fever. She is Not sexually active. There is No history of pyelonephritis. Associated symptoms include frequency. Pertinent negatives include no chills, discharge, flank pain, hematuria, nausea, urgency or vomiting. Associated symptoms comments:  Bladder pressure, vaginal itching. She has tried antibiotics and increased fluids for the symptoms. Her past medical history is significant for kidney stones. There is no history of catheterization, recurrent UTIs, a single kidney, urinary stasis or a urological procedure.         Relevant past medical, surgical, family and social history reviewed and updated as indicated. Interim medical history since our last visit reviewed. Allergies and medications reviewed and updated. Outpatient Medications Prior to Visit  Medication Sig Dispense Refill   estradiol (ESTRACE) 0.1 MG/GM vaginal cream USE  1/2 GRAM VAGINALLY 2-3 TIMES PER WEEK TO MAINTAIN SYMPTOM RELIEF AS DIRECTED 42.5 g 1   hydrochlorothiazide (HYDRODIURIL) 25 MG tablet Take 1 tablet (25 mg total) by mouth daily. for blood pressure. 90 tablet 2   olmesartan (BENICAR) 40 MG tablet Take 1 tablet (40 mg total) by mouth daily. for blood pressure. 90 tablet 2   omeprazole (PRILOSEC) 40 MG capsule Take 1 capsule (40 mg total) by mouth daily. for heartburn. 90 capsule 0   No facility-administered medications prior to visit.     Per HPI unless specifically indicated in ROS section below Review of Systems  Constitutional:  Negative for chills, fatigue and fever.  HENT:  Negative for congestion.   Eyes:  Negative for pain.  Respiratory:  Negative for cough and shortness of breath.   Cardiovascular:  Negative for chest pain, palpitations and leg swelling.  Gastrointestinal:  Negative for abdominal pain, nausea and vomiting.  Genitourinary:  Positive for frequency. Negative for dysuria, flank pain, hematuria, urgency and vaginal bleeding.  Musculoskeletal:  Negative for back pain.  Neurological:  Negative for syncope, light-headedness and headaches.  Psychiatric/Behavioral:  Negative for dysphoric mood.    Objective:  BP 110/74 (BP Location: Left Arm, Patient Position: Sitting, Cuff Size: Normal)   Pulse 94   Temp 98.1 F (36.7 C) (Temporal)   Ht 5' 0.5" (1.537 m)   Wt 159 lb 4 oz (72.2 kg)   LMP 07/18/2010 (Approximate)  SpO2 98%   BMI 30.59 kg/m   Wt Readings from Last 3 Encounters:  09/26/23 159 lb 4 oz (72.2 kg)  08/03/23 158 lb 6 oz (71.8 kg)  06/26/23 153 lb (69.4 kg)      Physical Exam Constitutional:      General: She is not in acute distress.    Appearance: Normal appearance. She is well-developed. She is not ill-appearing or toxic-appearing.  HENT:     Head: Normocephalic.     Right Ear: Hearing, tympanic membrane, ear canal and external ear normal. Tympanic membrane is not erythematous, retracted or  bulging.     Left Ear: Hearing, tympanic membrane, ear canal and external ear normal. Tympanic membrane is not erythematous, retracted or bulging.     Nose: No mucosal edema or rhinorrhea.     Right Sinus: No maxillary sinus tenderness or frontal sinus tenderness.     Left Sinus: No maxillary sinus tenderness or frontal sinus tenderness.     Mouth/Throat:     Pharynx: Uvula midline.  Eyes:     General: Lids are normal. Lids are everted, no foreign bodies appreciated.     Conjunctiva/sclera: Conjunctivae normal.     Pupils: Pupils are equal, round, and reactive to light.  Neck:     Thyroid: No thyroid mass or thyromegaly.     Vascular: No carotid bruit.     Trachea: Trachea normal.  Cardiovascular:     Rate and Rhythm: Normal rate and regular rhythm.     Pulses: Normal pulses.     Heart sounds: Normal heart sounds, S1 normal and S2 normal. No murmur heard.    No friction rub. No gallop.  Pulmonary:     Effort: Pulmonary effort is normal. No tachypnea or respiratory distress.     Breath sounds: Normal breath sounds. No decreased breath sounds, wheezing, rhonchi or rales.  Abdominal:     General: Bowel sounds are normal.     Palpations: Abdomen is soft.     Tenderness: There is no abdominal tenderness.  Musculoskeletal:     Cervical back: Normal range of motion and neck supple.  Skin:    General: Skin is warm and dry.     Findings: No rash.  Neurological:     Mental Status: She is alert.  Psychiatric:        Mood and Affect: Mood is not anxious or depressed.        Speech: Speech normal.        Behavior: Behavior normal. Behavior is cooperative.        Thought Content: Thought content normal.        Judgment: Judgment normal.       Results for orders placed or performed in visit on 09/26/23  POCT Urinalysis Dipstick (Automated)   Collection Time: 09/26/23  8:42 AM  Result Value Ref Range   Color, UA Yellow    Clarity, UA Clear    Glucose, UA Negative Negative    Bilirubin, UA Negative    Ketones, UA Negative    Spec Grav, UA 1.025 1.010 - 1.025   Blood, UA Trace    pH, UA 6.0 5.0 - 8.0   Protein, UA Positive (A) Negative   Urobilinogen, UA 0.2 0.2 or 1.0 E.U./dL   Nitrite, UA Negative    Leukocytes, UA Trace (A) Negative    Assessment and Plan  E-coli UTI Assessment & Plan:  Acute, Culture returned showing multidrug resistant Ecoli.  Likely continue UTI gien abn UA .Marland KitchenMarland Kitchen  Send for culture and treat with Augmentin given sensitivity and SE to macrobid. Augmentin BID x 7-10 days. Pt also with vaginal itching.. likely mild yeast infeciton... treat with antifungal at end of antibiotics, may repeta dose x 1 if not resolved after 2 days.   Urinary frequency -     POCT Urinalysis Dipstick (Automated)  Other orders -     Amoxicillin-Pot Clavulanate; Take 1 tablet by mouth 2 (two) times daily.  Dispense: 20 tablet; Refill: 0 -     Fluconazole; Take 1 tablet (150 mg total) by mouth once for 1 dose.  Dispense: 2 tablet; Refill: 0    No follow-ups on file.   Kerby Nora, MD

## 2023-09-28 ENCOUNTER — Encounter: Payer: Self-pay | Admitting: Family Medicine

## 2023-09-28 LAB — URINE CULTURE
MICRO NUMBER:: 16186470
SPECIMEN QUALITY:: ADEQUATE

## 2023-10-24 ENCOUNTER — Ambulatory Visit: Admitting: Family Medicine

## 2023-10-24 ENCOUNTER — Encounter: Payer: Self-pay | Admitting: Family Medicine

## 2023-10-24 VITALS — BP 130/80 | HR 81 | Temp 97.3°F | Ht 60.5 in | Wt 160.1 lb

## 2023-10-24 DIAGNOSIS — N898 Other specified noninflammatory disorders of vagina: Secondary | ICD-10-CM

## 2023-10-24 DIAGNOSIS — R350 Frequency of micturition: Secondary | ICD-10-CM

## 2023-10-24 LAB — POC URINALSYSI DIPSTICK (AUTOMATED)
Bilirubin, UA: NEGATIVE
Blood, UA: NEGATIVE
Glucose, UA: NEGATIVE
Leukocytes, UA: NEGATIVE
Nitrite, UA: NEGATIVE
Protein, UA: POSITIVE — AB
Spec Grav, UA: 1.025 (ref 1.010–1.025)
Urobilinogen, UA: 0.2 U/dL
pH, UA: 6 (ref 5.0–8.0)

## 2023-10-24 NOTE — Progress Notes (Signed)
 Patient ID: Raven Romero, female    DOB: 1960-10-10, 63 y.o.   MRN: [id number]  This visit was conducted in person.  BP 130/80 (BP Location: Left Arm, Patient Position: Sitting, Cuff Size: Normal)   Pulse 81   Temp (!) 97.3 F (36.3 C) (Temporal)   Ht 5' 0.5" (1.537 m)   Wt 160 lb 2 oz (72.6 kg)   LMP 07/18/2010 (Approximate)   SpO2 99%   BMI 30.76 kg/m    CC:  Chief Complaint  Patient presents with   Urinary Frequency   Vaginal Itching    Subjective:   HPI: Raven Romero is a 63 y.o. female presenting on 10/24/2023 for Urinary Frequency and Vaginal Itching   Initial OV 09/17/2023 at [facility]  treated for [facility] UTI with  Cipro Recent OV 09/26/2023 for ESBL  EColi UTI.Marland Kitchen treated with Augmentin.   She reports  Her symptoms resolved/ returned to baseline.  She has noted vaginal itchiness and urinary frequency in last  3-4 days ago...noting more at night.  Has noted vaginal discharge, white.  Occ noted " trash in urine"  She took 2 tabs of fluconazole after completion of antibiotics.  She as taken Azo at home.       Relevant past medical, surgical, family and social history reviewed and updated as indicated. Interim medical history since our last visit reviewed. Allergies and medications reviewed and updated. Outpatient Medications Prior to Visit  Medication Sig Dispense Refill   estradiol (ESTRACE) 0.1 MG/GM vaginal cream USE 1/2 GRAM VAGINALLY 2-3 TIMES PER WEEK TO MAINTAIN SYMPTOM RELIEF AS DIRECTED 42.5 g 1   hydrochlorothiazide (HYDRODIURIL) 25 MG tablet Take 1 tablet (25 mg total) by mouth daily. for blood pressure. 90 tablet 2   olmesartan (BENICAR) 40 MG tablet Take 1 tablet (40 mg total) by mouth daily. for blood pressure. 90 tablet 2   omeprazole (PRILOSEC) 40 MG capsule Take 1 capsule (40 mg total) by mouth daily. for heartburn. 90 capsule 0   amoxicillin-clavulanate (AUGMENTIN) 875-125 MG tablet Take 1 tablet by mouth 2 (two) times daily. 20 tablet 0   No  facility-administered medications prior to visit.     Per HPI unless specifically indicated in ROS section below Review of Systems  Constitutional:  Negative for fatigue and fever.  HENT:  Negative for congestion.   Eyes:  Negative for pain.  Respiratory:  Negative for cough and shortness of breath.   Cardiovascular:  Negative for chest pain, palpitations and leg swelling.  Gastrointestinal:  Negative for abdominal pain.  Genitourinary:  Positive for frequency and vaginal discharge. Negative for decreased urine volume, dysuria, flank pain, urgency, vaginal bleeding and vaginal pain.  Musculoskeletal:  Negative for back pain.  Neurological:  Negative for syncope, light-headedness and headaches.  Psychiatric/Behavioral:  Negative for dysphoric mood.    Objective:  BP 130/80 (BP Location: Left Arm, Patient Position: Sitting, Cuff Size: Normal)   Pulse 81   Temp (!) 97.3 F (36.3 C) (Temporal)   Ht 5' 0.5" (1.537 m)   Wt 160 lb 2 oz (72.6 kg)   LMP 07/18/2010 (Approximate)   SpO2 99%   BMI 30.76 kg/m   Wt Readings from Last 3 Encounters:  10/24/23 160 lb 2 oz (72.6 kg)  09/26/23 159 lb 4 oz (72.2 kg)  08/03/23 158 lb 6 oz (71.8 kg)      Physical Exam Exam conducted with a chaperone present.  Constitutional:      General: She is not  in acute distress.    Appearance: Normal appearance. She is well-developed. She is not ill-appearing or toxic-appearing.  HENT:     Head: Normocephalic.     Right Ear: Hearing, tympanic membrane, ear canal and external ear normal. Tympanic membrane is not erythematous, retracted or bulging.     Left Ear: Hearing, tympanic membrane, ear canal and external ear normal. Tympanic membrane is not erythematous, retracted or bulging.     Nose: No mucosal edema or rhinorrhea.     Right Sinus: No maxillary sinus tenderness or frontal sinus tenderness.     Left Sinus: No maxillary sinus tenderness or frontal sinus tenderness.     Mouth/Throat:     Pharynx:  Uvula midline.  Eyes:     General: Lids are normal. Lids are everted, no foreign bodies appreciated.     Conjunctiva/sclera: Conjunctivae normal.     Pupils: Pupils are equal, round, and reactive to light.  Neck:     Thyroid: No thyroid mass or thyromegaly.     Vascular: No carotid bruit.     Trachea: Trachea normal.  Cardiovascular:     Rate and Rhythm: Normal rate and regular rhythm.     Pulses: Normal pulses.     Heart sounds: Normal heart sounds, S1 normal and S2 normal. No murmur heard.    No friction rub. No gallop.  Pulmonary:     Effort: Pulmonary effort is normal. No tachypnea or respiratory distress.     Breath sounds: Normal breath sounds. No decreased breath sounds, wheezing, rhonchi or rales.  Abdominal:     General: Bowel sounds are normal.     Palpations: Abdomen is soft.     Tenderness: There is no abdominal tenderness.  Genitourinary:    Exam position: Supine.     Pubic Area: No rash.      Labia:        Right: No tenderness or lesion.        Left: No tenderness or lesion.      Urethra: No prolapse, urethral pain, urethral swelling or urethral lesion.     Vagina: Vaginal discharge present.  Musculoskeletal:     Cervical back: Normal range of motion and neck supple.  Skin:    General: Skin is warm and dry.     Findings: No rash.  Neurological:     Mental Status: She is alert.  Psychiatric:        Mood and Affect: Mood is not anxious or depressed.        Speech: Speech normal.        Behavior: Behavior normal. Behavior is cooperative.        Thought Content: Thought content normal.        Judgment: Judgment normal.       Results for orders placed or performed in visit on 10/24/23  POCT Urinalysis Dipstick (Automated)   Collection Time: 10/24/23  8:31 AM  Result Value Ref Range   Color, UA Yellow    Clarity, UA Clear    Glucose, UA Negative Negative   Bilirubin, UA Negative    Ketones, UA Trace    Spec Grav, UA 1.025 1.010 - 1.025   Blood, UA  Negative    pH, UA 6.0 5.0 - 8.0   Protein, UA Positive (A) Negative   Urobilinogen, UA 0.2 0.2 or 1.0 E.U./dL   Nitrite, UA Negative    Leukocytes, UA Negative Negative    Assessment and Plan  Urinary frequency -  POCT Urinalysis Dipstick (Automated)  Vaginal itching -     WET PREP BY MOLECULAR PROBE    EColi UTI appears resolved. Encouraged pt to push fluids.  Symptoms most likely due to candidal vaginitis.  Will send out wet prep to determine.  Pt has also requested Urine Culture sent.  No follow-ups on file.   Kerby Nora, MD

## 2023-10-25 ENCOUNTER — Encounter: Payer: Self-pay | Admitting: Family Medicine

## 2023-10-25 LAB — WET PREP BY MOLECULAR PROBE
Candida species: NOT DETECTED
MICRO NUMBER:: 16302987
SPECIMEN QUALITY:: ADEQUATE
Trichomonas vaginosis: NOT DETECTED

## 2023-10-25 LAB — URINE CULTURE
MICRO NUMBER:: 16302986
Result:: NO GROWTH
SPECIMEN QUALITY:: ADEQUATE

## 2023-10-25 MED ORDER — CLEOCIN 100 MG VA SUPP: 100.0000 mg | Freq: Every day | VAGINAL | 0 refills | Status: AC

## 2023-10-26 ENCOUNTER — Other Ambulatory Visit (HOSPITAL_COMMUNITY): Payer: Self-pay

## 2023-10-26 MED ORDER — CLINDAMYCIN HCL 300 MG PO CAPS: 300.0000 mg | ORAL_CAPSULE | Freq: Two times a day (BID) | ORAL | 0 refills | Status: AC

## 2023-10-26 NOTE — Addendum Note (Signed)
 Addended byKerby Nora E on: 10/26/2023 12:04 PM   Modules accepted: Orders

## 2023-10-31 ENCOUNTER — Ambulatory Visit: Payer: Self-pay

## 2023-11-06 ENCOUNTER — Ambulatory Visit
Admission: RE | Admit: 2023-11-06 | Discharge: 2023-11-06 | Disposition: A | Discharge status: Home / Self Care | Payer: Self-pay | Source: Ambulatory Visit | Attending: Primary Care | Admitting: Primary Care

## 2023-11-06 DIAGNOSIS — Z1231 Encounter for screening mammogram for malignant neoplasm of breast: Secondary | ICD-10-CM

## 2023-12-18 ENCOUNTER — Other Ambulatory Visit: Payer: Self-pay

## 2023-12-18 DIAGNOSIS — K219 Gastro-esophageal reflux disease without esophagitis: Secondary | ICD-10-CM

## 2023-12-18 MED ORDER — OMEPRAZOLE 40 MG PO CPDR: 40.0000 mg | DELAYED_RELEASE_CAPSULE | Freq: Every day | ORAL | 1 refills | Status: DC

## 2024-01-13 ENCOUNTER — Other Ambulatory Visit: Payer: Self-pay | Admitting: Nurse Practitioner

## 2024-01-13 DIAGNOSIS — K219 Gastro-esophageal reflux disease without esophagitis: Secondary | ICD-10-CM

## 2024-05-21 ENCOUNTER — Other Ambulatory Visit: Payer: Self-pay | Admitting: Primary Care

## 2024-05-21 DIAGNOSIS — I10 Essential (primary) hypertension: Secondary | ICD-10-CM

## 2024-05-21 DIAGNOSIS — K219 Gastro-esophageal reflux disease without esophagitis: Secondary | ICD-10-CM

## 2024-05-21 MED ORDER — OMEPRAZOLE 40 MG PO CPDR: 40.0000 mg | DELAYED_RELEASE_CAPSULE | Freq: Every day | ORAL | 0 refills | Status: DC

## 2024-05-21 MED ORDER — HYDROCHLOROTHIAZIDE 25 MG PO TABS: 25.0000 mg | ORAL_TABLET | Freq: Every day | ORAL | 0 refills | Status: DC

## 2024-05-21 NOTE — Telephone Encounter (Unsigned)
 Copied from CRM #[id number]. Topic: Clinical - Medication Refill >> May 21, 2024 10:16 AM Victoria A wrote: Medication: hydrochlorothiazide  (HYDRODIURIL ) 25 MG tablet; omeprazole  (PRILOSEC) 40 MG capsule  Has the patient contacted their pharmacy? No (Agent: If no, request that the patient contact the pharmacy for the refill. If patient does not wish to contact the pharmacy document the reason why and proceed with request.) (Agent: If yes, when and what did the pharmacy advise?)  This is the patient's preferred pharmacy:  [id number] - [place], [place] - [address] AT [facility] [address] [facility] [address] [place] [address] Phone: [phone] Fax: [phone]  Is this the correct pharmacy for this prescription? Yes If no, delete pharmacy and type the correct one.   Has the prescription been filled recently? No  Is the patient out of the medication? Yes  Has the patient been seen for an appointment in the last year OR does the patient have an upcoming appointment? Yes  Can we respond through MyChart? Yes  Agent: Please be advised that Rx refills may take up to 3 business days. We ask that you follow-up with your pharmacy.

## 2024-05-22 MED ORDER — OLMESARTAN MEDOXOMIL 40 MG PO TABS: 40.0000 mg | ORAL_TABLET | Freq: Every day | ORAL | 0 refills | Status: DC

## 2024-06-03 ENCOUNTER — Ambulatory Visit: Admitting: Podiatry

## 2024-06-03 ENCOUNTER — Encounter: Payer: Self-pay | Admitting: Podiatry

## 2024-06-03 DIAGNOSIS — M722 Plantar fascial fibromatosis: Secondary | ICD-10-CM | POA: Diagnosis not present

## 2024-06-03 DIAGNOSIS — M7752 Other enthesopathy of left foot: Secondary | ICD-10-CM | POA: Diagnosis not present

## 2024-06-03 DIAGNOSIS — M779 Enthesopathy, unspecified: Secondary | ICD-10-CM

## 2024-06-03 MED ORDER — TRIAMCINOLONE ACETONIDE 10 MG/ML IJ SUSP
10.0000 mg | Freq: Once | INTRAMUSCULAR | Status: AC
  Administered 2024-06-03: 10 mg via INTRA_ARTICULAR

## 2024-06-05 NOTE — Progress Notes (Signed)
 Subjective:   Patient ID: Raven Romero, female   DOB: 63 y.o.   MRN: [id number]   HPI Patient presents with a lot of pain in the right heel and the dorsum of the left foot mild on the plantar.  States its been doing well overall but has started to bother her again   ROS      Objective:  Physical Exam  Neurovascular status intact with pain of the plantar fascia insertion right fluid buildup in dorsal tendon complex left painful when pressed with moderate plantar fascial symptomatology left     Assessment:  Plantar fasciitis right and tendinitis left      Plan:  H&P reviewed sterile prep injected the plantar fascia right at insertion 3 mg dexamethasone  Kenalog  5 mg Xylocaine  in the dorsal tendon complex left 3 mg dexamethasone  Kenalog  5 mg Xylocaine  applied sterile dressing reappoint to recheck

## 2024-08-06 ENCOUNTER — Telehealth: Payer: Self-pay

## 2024-08-06 ENCOUNTER — Ambulatory Visit: Payer: Self-pay | Admitting: Primary Care

## 2024-08-06 ENCOUNTER — Encounter: Payer: Self-pay | Admitting: Primary Care

## 2024-08-06 ENCOUNTER — Ambulatory Visit: Admitting: Primary Care

## 2024-08-06 ENCOUNTER — Other Ambulatory Visit (HOSPITAL_COMMUNITY)
Admission: RE | Admit: 2024-08-06 | Discharge: 2024-08-06 | Disposition: A | Source: Ambulatory Visit | Attending: Primary Care | Admitting: Primary Care

## 2024-08-06 VITALS — BP 138/84 | HR 82 | Temp 98.2°F | Ht 60.25 in | Wt 166.2 lb

## 2024-08-06 DIAGNOSIS — Z0001 Encounter for general adult medical examination with abnormal findings: Secondary | ICD-10-CM

## 2024-08-06 DIAGNOSIS — I10 Essential (primary) hypertension: Secondary | ICD-10-CM | POA: Diagnosis not present

## 2024-08-06 DIAGNOSIS — G8929 Other chronic pain: Secondary | ICD-10-CM | POA: Diagnosis not present

## 2024-08-06 DIAGNOSIS — Z124 Encounter for screening for malignant neoplasm of cervix: Secondary | ICD-10-CM | POA: Insufficient documentation

## 2024-08-06 DIAGNOSIS — R229 Localized swelling, mass and lump, unspecified: Secondary | ICD-10-CM

## 2024-08-06 DIAGNOSIS — L940 Localized scleroderma [morphea]: Secondary | ICD-10-CM | POA: Diagnosis not present

## 2024-08-06 DIAGNOSIS — L900 Lichen sclerosus et atrophicus: Secondary | ICD-10-CM | POA: Diagnosis not present

## 2024-08-06 DIAGNOSIS — K219 Gastro-esophageal reflux disease without esophagitis: Secondary | ICD-10-CM | POA: Diagnosis not present

## 2024-08-06 DIAGNOSIS — E785 Hyperlipidemia, unspecified: Secondary | ICD-10-CM | POA: Diagnosis not present

## 2024-08-06 DIAGNOSIS — M2550 Pain in unspecified joint: Secondary | ICD-10-CM

## 2024-08-06 DIAGNOSIS — D72819 Decreased white blood cell count, unspecified: Secondary | ICD-10-CM

## 2024-08-06 DIAGNOSIS — D649 Anemia, unspecified: Secondary | ICD-10-CM

## 2024-08-06 DIAGNOSIS — R7303 Prediabetes: Secondary | ICD-10-CM

## 2024-08-06 LAB — CBC WITH DIFFERENTIAL/PLATELET
Basophils Absolute: 0 K/uL (ref 0.0–0.1)
Basophils Relative: 1 % (ref 0.0–3.0)
Eosinophils Absolute: 0 K/uL (ref 0.0–0.7)
Eosinophils Relative: 0.2 % (ref 0.0–5.0)
HCT: 28.6 % — ABNORMAL LOW (ref 36.0–46.0)
Hemoglobin: 9.2 g/dL — ABNORMAL LOW (ref 12.0–15.0)
Lymphocytes Relative: 60.4 % — ABNORMAL HIGH (ref 12.0–46.0)
Lymphs Abs: 1.1 K/uL (ref 0.7–4.0)
MCHC: 32.3 g/dL (ref 30.0–36.0)
MCV: 87 fl (ref 78.0–100.0)
Monocytes Absolute: 0.1 K/uL (ref 0.1–1.0)
Monocytes Relative: 4.4 % (ref 3.0–12.0)
Neutro Abs: 0.6 K/uL — ABNORMAL LOW (ref 1.4–7.7)
Neutrophils Relative %: 34 % — ABNORMAL LOW (ref 43.0–77.0)
Platelets: 261 K/uL (ref 150.0–400.0)
RBC: 3.28 Mil/uL — ABNORMAL LOW (ref 3.87–5.11)
RDW: 16.2 % — ABNORMAL HIGH (ref 11.5–15.5)
WBC: 1.9 K/uL — CL (ref 4.0–10.5)

## 2024-08-06 LAB — COMPREHENSIVE METABOLIC PANEL WITH GFR
ALT: 9 U/L (ref 3–35)
AST: 19 U/L (ref 5–37)
Albumin: 4.4 g/dL (ref 3.5–5.2)
Alkaline Phosphatase: 65 U/L (ref 39–117)
BUN: 20 mg/dL (ref 6–23)
CO2: 30 meq/L (ref 19–32)
Calcium: 9.7 mg/dL (ref 8.4–10.5)
Chloride: 105 meq/L (ref 96–112)
Creatinine, Ser: 0.87 mg/dL (ref 0.40–1.20)
GFR: 70.87 mL/min
Glucose, Bld: 87 mg/dL (ref 70–99)
Potassium: 3.8 meq/L (ref 3.5–5.1)
Sodium: 140 meq/L (ref 135–145)
Total Bilirubin: 0.7 mg/dL (ref 0.2–1.2)
Total Protein: 7.1 g/dL (ref 6.0–8.3)

## 2024-08-06 LAB — LIPID PANEL
Cholesterol: 182 mg/dL (ref 28–200)
HDL: 73.6 mg/dL
LDL Cholesterol: 97 mg/dL (ref 10–99)
NonHDL: 107.92
Total CHOL/HDL Ratio: 2
Triglycerides: 53 mg/dL (ref 10.0–149.0)
VLDL: 10.6 mg/dL (ref 0.0–40.0)

## 2024-08-06 LAB — C-REACTIVE PROTEIN: CRP: <0.5 mg/dL — ABNORMAL LOW (ref 1.0–20.0)

## 2024-08-06 MED ORDER — ESTRADIOL 0.01 % VA CREA: TOPICAL_CREAM | VAGINAL | 1 refills | Status: AC

## 2024-08-06 MED ORDER — OLMESARTAN MEDOXOMIL 40 MG PO TABS: 40.0000 mg | ORAL_TABLET | Freq: Every day | ORAL | 3 refills | Status: DC

## 2024-08-06 MED ORDER — HYDROCHLOROTHIAZIDE 25 MG PO TABS: 25.0000 mg | ORAL_TABLET | Freq: Every day | ORAL | 3 refills | Status: AC

## 2024-08-06 MED ORDER — OMEPRAZOLE 40 MG PO CPDR: 40.0000 mg | DELAYED_RELEASE_CAPSULE | Freq: Every day | ORAL | 3 refills | Status: AC

## 2024-08-06 NOTE — Patient Instructions (Signed)
 Stop by the lab prior to leaving today. I will notify you of your results once received.   It was a pleasure to see you today!

## 2024-08-06 NOTE — Telephone Encounter (Signed)
" °  CRITICAL VALUE: WBC 1.9  RECEIVER (on-site recipient of call): Olanda Boughner Y Kasumi Ditullio, CMA   DATE & TIME NOTIFIED: 1:07 PM 08/06/24   MESSENGER (representative from lab): Darice   MD NOTIFIED: Mallie Gaskins, DPN AGNP-C  TIME OF NOTIFICATION:1:07 PM  RESPONSE: sending high priority message as well as letting provider know verbally.   "

## 2024-08-06 NOTE — Progress Notes (Signed)
 "  Subjective:    Patient ID: Raven Romero, female    DOB: [birth year], 64 y.o.   MRN: [id number]  Raven Romero is a very pleasant 64 y.o. female who presents today for complete physical and follow up of chronic conditions.  She would also like to discuss several other things.  Chronic masses to the right anterior ankle and dorsal foot since September 2025. Evaluated by podiatry who tolder her she has a cyst. No imaging was completed. She underwent a steroid injection to one of the cysts which helped to reduce the size. She denies pain, erythema.   She presented to [facility] ED in December 2025 for chest pain and URI symptoms. Cardiac causes were ruled out. She was treated with antibiotics. Lab work revealed anemia with hemaglobin of 9.5 with WBC of 3.4. she denies rectal and vaginal bleeding. She is UTD with colonoscopy.  She continues to struggle with chronic generalized joint pain. She does have a history of elevated CRP for which is secondary to her Morphea. She has not been tested for rheumatoid arthritis.   Immunizations: -Tetanus: Completed in 2023 -Influenza: Declines influenza vaccine.  -Shingles: Never completed Shingrix series   Diet: Fair diet.  Exercise: No regular exercise.  Eye exam: Completes annually  Dental exam: Completes semi-annually    Pap Smear: Completed in June 2022 per GYN, due Mammogram: Completed in April 2025  Colonoscopy: Completed in 2023 due 2028  BP Readings from Last 3 Encounters:  08/06/24 138/84  10/24/23 130/80  09/26/23 110/74     Wt Readings from Last 3 Encounters:  08/06/24 166 lb 4 oz (75.4 kg)  10/24/23 160 lb 2 oz (72.6 kg)  09/26/23 159 lb 4 oz (72.2 kg)       Review of Systems  Constitutional:  Negative for unexpected weight change.  HENT:  Negative for rhinorrhea.   Respiratory:  Negative for cough and shortness of breath.   Cardiovascular:  Negative for chest pain.  Gastrointestinal:  Negative for constipation and  diarrhea.  Genitourinary:  Negative for difficulty urinating.  Musculoskeletal:  Negative for arthralgias and myalgias.  Skin:  Negative for rash.  Allergic/Immunologic: Negative for environmental allergies.  Neurological:  Negative for dizziness and headaches.  Psychiatric/Behavioral:  The patient is not nervous/anxious.          Past Medical History:  Diagnosis Date   Breast mass, right    Cervical polyp 04/09/2015   2 small polyps on ultrasound, smaller on US  05/18/21.   E-coli UTI 09/26/2023   Hypertension    Kidney stone 04/10/2020   Kidney stones    Lichen sclerosus    Long-term use of Plaquenil 06/06/2017   Morphea    Shingles     Social History   Socioeconomic History   Marital status: Married    Spouse name: Not on file   Number of children: Not on file   Years of education: Not on file   Highest education level: Not on file  Occupational History   Not on file  Tobacco Use   Smoking status: Never   Smokeless tobacco: Never  Vaping Use   Vaping status: Never Used  Substance and Sexual Activity   Alcohol use: Yes    Alcohol/week: 3.0 standard drinks of alcohol    Types: 3 Glasses of wine per week    Comment: weekly   Drug use: No   Sexual activity: Yes    Partners: Male    Birth control/protection: Surgical  Comment: btsp  Other Topics Concern   Not on file  Social History Narrative   Married.    1 child, 2 grandchildren.   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   Enjoys traveling to the beach.    Social Drivers of Health   Tobacco Use: Low Risk (08/06/2024)   Patient History    Smoking Tobacco Use: Never    Smokeless Tobacco Use: Never    Passive Exposure: Not on file  Financial Resource Strain: Not on file  Food Insecurity: Not on file  Transportation Needs: Not on file  Physical Activity: Not on file  Stress: Not on file  Social Connections: Not on file  Intimate Partner Violence: Not on file  Depression (PHQ2-9): Low Risk (08/06/2024)   Depression  (PHQ2-9)    PHQ-2 Score: 0  Alcohol Screen: Not on file  Housing: Unknown (09/17/2023)   Received from [facility]   Epic    Unable to Pay for Housing in the Last Year: Not on file    Number of Times Moved in the Last Year: Not on file    At any time in the past 12 months, were you homeless or living in a shelter (including now)?: No  Utilities: Not on file  Health Literacy: Not on file    Past Surgical History:  Procedure Laterality Date   BREAST EXCISIONAL BIOPSY Right 06/2015   BREAST LUMPECTOMY WITH RADIOACTIVE SEED LOCALIZATION Right 06/25/2015   Procedure: RIGHT BREAST SEED LOCALIZATION LUMPECTOMY ;  Surgeon: Debby Shipper, MD;  Location: [facility];  Service: General;  Laterality: Right;   CYSTECTOMY  1999   ovarian cystectomy   EXTRACORPOREAL SHOCK WAVE LITHOTRIPSY Left 06/17/2019   Procedure: EXTRACORPOREAL SHOCK WAVE LITHOTRIPSY (ESWL);  Surgeon: Renda Glance, MD;  Location: [facility];  Service: Urology;  Laterality: Left;   fractured arm Right 01/04/2021   NASAL SINUS SURGERY     TUBAL LIGATION     BTSP    Family History  Problem Relation Age of Onset   Hypertension Mother    Breast cancer Mother    Heart disease Mother    Diabetes Father    Heart disease Father    Diabetes Paternal Grandmother    Diabetes Paternal Grandfather     Allergies[1]  Medications Ordered Prior to Encounter[2]  BP 138/84   Pulse 82   Temp 98.2 F (36.8 C) (Oral)   Ht 5' 0.25 (1.53 m)   Wt 166 lb 4 oz (75.4 kg)   LMP 07/18/2010   SpO2 99%   BMI 32.20 kg/m  Objective:   Physical Exam Exam conducted with a chaperone present.  HENT:     Right Ear: Tympanic membrane and ear canal normal.     Left Ear: Tympanic membrane and ear canal normal.  Eyes:     Pupils: Pupils are equal, round, and reactive to light.  Cardiovascular:     Rate and Rhythm: Normal rate and regular rhythm.  Pulmonary:     Effort: Pulmonary effort is normal.     Breath  sounds: Normal breath sounds.  Abdominal:     General: Bowel sounds are normal.     Palpations: Abdomen is soft.     Tenderness: There is no abdominal tenderness.  Genitourinary:    Labia:        Right: No rash, tenderness or lesion.        Left: No rash, tenderness or lesion.      Vagina: Normal.  Cervix: Normal.     Uterus: Normal.      Adnexa: Right adnexa normal and left adnexa normal.  Musculoskeletal:        General: Normal range of motion.     Cervical back: Neck supple.  Skin:    General: Skin is warm and dry.     Comments: 2 cm rounded subcutaneous mass, immobile to left anterior ankle. 2 cm rounded subcutaneous mass, immobile to left dorsal foot.  Deep tissue mass to left posterior calf  Neurological:     Mental Status: She is alert and oriented to person, place, and time.     Cranial Nerves: No cranial nerve deficit.     Deep Tendon Reflexes:     Reflex Scores:      Patellar reflexes are 2+ on the right side and 2+ on the left side. Psychiatric:        Mood and Affect: Mood normal.     Physical Exam        Assessment & Plan:  Encounter for annual general medical examination with abnormal findings in adult Assessment & Plan: Declines influenza and Shingrix vaccines Pap smear due completed today Mammogram UTD Colonoscopy UTD, due 2028  Discussed the importance of a healthy diet and regular exercise in order for weight loss, and to reduce the risk of further co-morbidity.  Exam stable. Labs pending.  Follow up in 1 year for repeat physical.    Essential hypertension Assessment & Plan: Stable.   Continue hydrochlorothiazide  25 mg daily and olmesartan  40 mg daily. CMP pending.  Orders: -     hydroCHLOROthiazide ; Take 1 tablet (25 mg total) by mouth daily. for blood pressure.  Dispense: 90 tablet; Refill: 3 -     Olmesartan  Medoxomil; Take 1 tablet (40 mg total) by mouth daily. for blood pressure.  Dispense: 90 tablet; Refill: 3 -     CBC with  Differential/Platelet -     Comprehensive metabolic panel with GFR  Gastroesophageal reflux disease, unspecified whether esophagitis present Assessment & Plan: Controlled.  Continue omeprazole  40 mg daily.  Orders: -     Omeprazole ; Take 1 capsule (40 mg total) by mouth daily. for heartburn.  Dispense: 90 capsule; Refill: 3  Prediabetes Assessment & Plan: Repeat A1C pending.  Work on a healthy diet and regular exercise in order for weight loss, and to reduce the risk of further co-morbidity.    Dyslipidemia (high LDL; low HDL) Assessment & Plan: Repeat lipid panel pending.  Work on a healthy diet and regular exercise in order for weight loss, and to reduce the risk of further co-morbidity.   Orders: -     Lipid panel  Localized skin mass, lump, or swelling -     US  LT LOWER EXTREM LTD SOFT TISSUE NON VASCULAR; Future  Lichen sclerosus -     Estradiol ; Apply 1/2 gram 2-3 times weekly  Dispense: 42.5 g; Refill: 1  Screening for cervical cancer -     Cytology - PAP  Morphea -     C-reactive protein  Chronic joint pain -     Rheumatoid factor -     Cyclic citrul peptide antibody, IgG    Assessment and Plan Assessment & Plan         Comer MARLA Gaskins, NP       [1]  Allergies Allergen Reactions   Flagyl  [Metronidazole ] Nausea And Vomiting   Macrobid   [Nitrofurantoin ] Nausea Only   Other     Medrol  Dosepak -  pt stated, Made face feel like it was burning from the inside out; face was blood red  [2]  No current outpatient medications on file prior to visit.   No current facility-administered medications on file prior to visit.   "

## 2024-08-06 NOTE — Telephone Encounter (Signed)
 Noted. Waiting for all labs to result. Will notify patient.

## 2024-08-06 NOTE — Assessment & Plan Note (Signed)
 Controlled.  Continue omeprazole  40 mg daily.

## 2024-08-06 NOTE — Assessment & Plan Note (Signed)
 Repeat lipid panel pending.  Work on a healthy diet and regular exercise in order for weight loss, and to reduce the risk of further co-morbidity.

## 2024-08-06 NOTE — Assessment & Plan Note (Signed)
 Stable.   Continue hydrochlorothiazide  25 mg daily and olmesartan  40 mg daily. CMP pending.

## 2024-08-06 NOTE — Assessment & Plan Note (Signed)
 Declines influenza and Shingrix vaccines Pap smear due completed today Mammogram UTD Colonoscopy UTD, due 2028  Discussed the importance of a healthy diet and regular exercise in order for weight loss, and to reduce the risk of further co-morbidity.  Exam stable. Labs pending.  Follow up in 1 year for repeat physical.

## 2024-08-06 NOTE — Assessment & Plan Note (Signed)
 Repeat A1C pending.  Work on a healthy diet and regular exercise in order for weight loss, and to reduce the risk of further co-morbidity.

## 2024-08-07 LAB — CYTOLOGY - PAP
Comment: NEGATIVE
Diagnosis: NEGATIVE
High risk HPV: NEGATIVE

## 2024-08-08 ENCOUNTER — Ambulatory Visit
Admission: RE | Admit: 2024-08-08 | Discharge: 2024-08-08 | Disposition: A | Discharge status: Home / Self Care | Source: Ambulatory Visit | Attending: Primary Care | Admitting: Primary Care

## 2024-08-08 DIAGNOSIS — R229 Localized swelling, mass and lump, unspecified: Secondary | ICD-10-CM | POA: Insufficient documentation

## 2024-08-08 LAB — RHEUMATOID FACTOR: Rheumatoid fact SerPl-aCnc: <10 [IU]/mL

## 2024-08-08 LAB — CYCLIC CITRUL PEPTIDE ANTIBODY, IGG: Cyclic Citrullin Peptide Ab: <16 U

## 2024-08-09 ENCOUNTER — Inpatient Hospital Stay: Attending: Oncology | Admitting: Oncology

## 2024-08-09 ENCOUNTER — Encounter: Payer: Self-pay | Admitting: Oncology

## 2024-08-09 ENCOUNTER — Inpatient Hospital Stay

## 2024-08-09 VITALS — BP 102/68 | HR 84 | Temp 97.7°F | Resp 18 | Ht 60.25 in | Wt 162.9 lb

## 2024-08-09 DIAGNOSIS — F32A Depression, unspecified: Secondary | ICD-10-CM | POA: Insufficient documentation

## 2024-08-09 DIAGNOSIS — I10 Essential (primary) hypertension: Secondary | ICD-10-CM | POA: Diagnosis not present

## 2024-08-09 DIAGNOSIS — D708 Other neutropenia: Secondary | ICD-10-CM | POA: Diagnosis not present

## 2024-08-09 DIAGNOSIS — Z803 Family history of malignant neoplasm of breast: Secondary | ICD-10-CM | POA: Insufficient documentation

## 2024-08-09 DIAGNOSIS — Z9851 Tubal ligation status: Secondary | ICD-10-CM | POA: Insufficient documentation

## 2024-08-09 DIAGNOSIS — B029 Zoster without complications: Secondary | ICD-10-CM | POA: Diagnosis not present

## 2024-08-09 DIAGNOSIS — Z87440 Personal history of urinary (tract) infections: Secondary | ICD-10-CM | POA: Insufficient documentation

## 2024-08-09 DIAGNOSIS — Z79899 Other long term (current) drug therapy: Secondary | ICD-10-CM | POA: Diagnosis not present

## 2024-08-09 DIAGNOSIS — E785 Hyperlipidemia, unspecified: Secondary | ICD-10-CM | POA: Diagnosis not present

## 2024-08-09 DIAGNOSIS — D649 Anemia, unspecified: Secondary | ICD-10-CM | POA: Insufficient documentation

## 2024-08-09 DIAGNOSIS — Z87442 Personal history of urinary calculi: Secondary | ICD-10-CM | POA: Diagnosis not present

## 2024-08-09 LAB — COMPREHENSIVE METABOLIC PANEL WITH GFR
ALT: 10 U/L (ref 0–44)
AST: 25 U/L (ref 15–41)
Albumin: 4.6 g/dL (ref 3.5–5.0)
Alkaline Phosphatase: 76 U/L (ref 38–126)
Anion gap: 13 (ref 5–15)
BUN: 26 mg/dL — ABNORMAL HIGH (ref 8–23)
CO2: 25 mmol/L (ref 22–32)
Calcium: 10.4 mg/dL — ABNORMAL HIGH (ref 8.9–10.3)
Chloride: 101 mmol/L (ref 98–111)
Creatinine, Ser: 1.07 mg/dL — ABNORMAL HIGH (ref 0.44–1.00)
GFR, Estimated: 58 mL/min — ABNORMAL LOW
Glucose, Bld: 102 mg/dL — ABNORMAL HIGH (ref 70–99)
Potassium: 3.3 mmol/L — ABNORMAL LOW (ref 3.5–5.1)
Sodium: 139 mmol/L (ref 135–145)
Total Bilirubin: 1.1 mg/dL (ref 0.0–1.2)
Total Protein: 7.6 g/dL (ref 6.5–8.1)

## 2024-08-09 LAB — PROTIME-INR
INR: 1 (ref 0.8–1.2)
Prothrombin Time: 13.4 s (ref 11.4–15.2)

## 2024-08-09 LAB — CBC WITH DIFFERENTIAL/PLATELET
Abs Immature Granulocytes: 0.01 K/uL (ref 0.00–0.07)
Basophils Absolute: 0 K/uL (ref 0.0–0.1)
Basophils Relative: 0 %
Eosinophils Absolute: 0 K/uL (ref 0.0–0.5)
Eosinophils Relative: 0 %
HCT: 29.2 % — ABNORMAL LOW (ref 36.0–46.0)
Hemoglobin: 9.1 g/dL — ABNORMAL LOW (ref 12.0–15.0)
Immature Granulocytes: 0 %
Lymphocytes Relative: 65 %
Lymphs Abs: 1.7 K/uL (ref 0.7–4.0)
MCH: 28.1 pg (ref 26.0–34.0)
MCHC: 31.2 g/dL (ref 30.0–36.0)
MCV: 90.1 fL (ref 80.0–100.0)
Monocytes Absolute: 0.1 K/uL (ref 0.1–1.0)
Monocytes Relative: 4 %
Neutro Abs: 0.8 K/uL — ABNORMAL LOW (ref 1.7–7.7)
Neutrophils Relative %: 31 %
Platelets: 272 K/uL (ref 150–400)
RBC: 3.24 MIL/uL — ABNORMAL LOW (ref 3.87–5.11)
RDW: 15.8 % — ABNORMAL HIGH (ref 11.5–15.5)
WBC: 2.7 K/uL — ABNORMAL LOW (ref 4.0–10.5)
nRBC: 0 % (ref 0.0–0.2)

## 2024-08-09 LAB — FERRITIN: Ferritin: 160 ng/mL (ref 11–307)

## 2024-08-09 LAB — TECHNOLOGIST SMEAR REVIEW
Plt Morphology: NORMAL
RBC MORPHOLOGY: NORMAL
WBC MORPHOLOGY: NORMAL

## 2024-08-09 LAB — APTT: aPTT: 27 s (ref 24–36)

## 2024-08-09 LAB — VITAMIN B12: Vitamin B-12: 1781 pg/mL — ABNORMAL HIGH (ref 180–914)

## 2024-08-09 LAB — RETICULOCYTES
Immature Retic Fract: 20.6 % — ABNORMAL HIGH (ref 2.3–15.9)
RBC.: 3.22 MIL/uL — ABNORMAL LOW (ref 3.87–5.11)
Retic Count, Absolute: 88.2 K/uL (ref 19.0–186.0)
Retic Ct Pct: 2.7 % (ref 0.4–3.1)

## 2024-08-09 LAB — FIBRINOGEN: Fibrinogen: 378 mg/dL (ref 210–475)

## 2024-08-09 LAB — LACTATE DEHYDROGENASE: LDH: 220 U/L (ref 105–235)

## 2024-08-09 LAB — FOLATE: Folate: 16.9 ng/mL

## 2024-08-09 LAB — TSH: TSH: 1.36 u[IU]/mL (ref 0.350–4.500)

## 2024-08-09 NOTE — Progress Notes (Signed)
 "  Hematology/Oncology Consult note [facility] [phone] [phone]  Patient Care Team: Gretta Comer POUR, NP as PCP - General (Internal Medicine) Cathlyn JAYSON Nikki Bobie FORBES, MD as Consulting Physician (Obstetrics and Gynecology) Clinton, Adela Eleanora Highland, MD as Referring Physician (Dermatology) Kristie Lamprey, MD as Consulting Physician (Gastroenterology) Melanee Annah JAYSON, MD as Consulting Physician (Oncology)   Name of the patient: Raven Romero  [id number]  01/23/1961    Reason for referral-leukopenia   Referring physician-Kathryn Gretta, NP  Date of visit: 08/09/24   History of presenting illness-patient is a 64 year old female with a past medical history significant for hypertension, GERD, hyperlipidemiaReferred for leukopenia.  She also has a history of lichen sclerosis and morphea for which she is not currently under any treatment  labs from 08/06/2024 showed white cell count of 1.9, H&H of 9.2/28.6 with an MCV of 87 and a platelet count of 261.  ANC was 0.6.  CRP was less than 0.5.  Rheumatoid factor negative.  CCP levels less than 16.  On 06/17/2024 patient noted to have white count of 3.4 with an H&H of 9.5/28.2 with an ANC of 1.8  In early December 2025, she developed acute symptoms of dizziness and near-syncope, prompting an urgent care visit where she was diagnosed with acute sinusitis and treated with amoxicillin . Symptoms initially improved but recurred on New Year's Eve, with persistent cough and sinus symptoms lasting approximately two weeks. She did not have influenza but remained mostly at home and experienced significant dehydration, stating she did not drink water during this period. She notes that amoxicillin  typically resolves her annual sinus infections quickly, but this episode was more prolonged and recurred after initial improvement. As of the current visit, she reports feeling well for the first time in a while, though she  continues to have nocturnal cough.  She describes chronic morning soreness and diffuse musculoskeletal pain, stating everything hurts upon awakening. She has not had a recent outbreak of lichen sclerosus and has previously undergone evaluation for rheumatologic conditions, including rheumatoid arthritis, with negative results. She denies abnormal bleeding, bruising, chest pain, shortness of breath, or new skin lesions. No recent changes in her health over the past two months aside from the above illness and dehydration.       History of Present Illness  ECOG PS- 0  Pain scale- 0   Review of systems- Review of Systems  Constitutional:  Negative for chills, fever, malaise/fatigue and weight loss.  HENT:  Negative for congestion, ear discharge and nosebleeds.   Eyes:  Negative for blurred vision.  Respiratory:  Negative for cough, hemoptysis, sputum production, shortness of breath and wheezing.   Cardiovascular:  Negative for chest pain, palpitations, orthopnea and claudication.  Gastrointestinal:  Negative for abdominal pain, blood in stool, constipation, diarrhea, heartburn, melena, nausea and vomiting.  Genitourinary:  Negative for dysuria, flank pain, frequency, hematuria and urgency.  Musculoskeletal:  Positive for myalgias. Negative for back pain and joint pain.  Skin:  Negative for rash.  Neurological:  Negative for dizziness, tingling, focal weakness, seizures, weakness and headaches.  Endo/Heme/Allergies:  Does not bruise/bleed easily.  Psychiatric/Behavioral:  Negative for depression and suicidal ideas. The patient does not have insomnia.     Allergies[1]  Patient Active Problem List   Diagnosis Date Noted   Prediabetes 12/01/2020   Encounter for annual general medical examination with abnormal findings in adult 04/10/2019   Morphea 04/10/2019   GERD (gastroesophageal reflux disease) 04/10/2019   Lichen sclerosus 12/12/2017  Age-related nuclear cataract of both eyes  06/06/2017   Hypertensive retinopathy of both eyes 06/06/2017   Cervical spondylosis 01/16/2017   Dyslipidemia (high LDL; low HDL) 12/10/2014   Essential hypertension 12/10/2014   Mixed incontinence 10/26/2012     Past Medical History:  Diagnosis Date   Breast mass, right    Cervical polyp 04/09/2015   2 small polyps on ultrasound, smaller on US  05/18/21.   E-coli UTI 09/26/2023   Hypertension    Kidney stone 04/10/2020   Kidney stones    Lichen sclerosus    Long-term use of Plaquenil 06/06/2017   Morphea    Shingles      Past Surgical History:  Procedure Laterality Date   BREAST EXCISIONAL BIOPSY Right 06/2015   BREAST LUMPECTOMY WITH RADIOACTIVE SEED LOCALIZATION Right 06/25/2015   Procedure: RIGHT BREAST SEED LOCALIZATION LUMPECTOMY ;  Surgeon: Debby Shipper, MD;  Location: [facility];  Service: General;  Laterality: Right;   CYSTECTOMY  1999   ovarian cystectomy   EXTRACORPOREAL SHOCK WAVE LITHOTRIPSY Left 06/17/2019   Procedure: EXTRACORPOREAL SHOCK WAVE LITHOTRIPSY (ESWL);  Surgeon: Renda Glance, MD;  Location: [facility];  Service: Urology;  Laterality: Left;   fractured arm Right 01/04/2021   NASAL SINUS SURGERY     TUBAL LIGATION     BTSP    Social History   Socioeconomic History   Marital status: Married    Spouse name: Not on file   Number of children: Not on file   Years of education: Not on file   Highest education level: Not on file  Occupational History   Not on file  Tobacco Use   Smoking status: Never   Smokeless tobacco: Never  Vaping Use   Vaping status: Never Used  Substance and Sexual Activity   Alcohol use: Yes    Alcohol/week: 3.0 standard drinks of alcohol    Types: 3 Glasses of wine per week    Comment: weekly   Drug use: No   Sexual activity: Yes    Partners: Male    Birth control/protection: Surgical    Comment: btsp  Other Topics Concern   Not on file  Social History Narrative   Married.    1 child, 2  grandchildren.   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   Enjoys traveling to the beach.    Social Drivers of Health   Tobacco Use: Low Risk (08/06/2024)   Patient History    Smoking Tobacco Use: Never    Smokeless Tobacco Use: Never    Passive Exposure: Not on file  Financial Resource Strain: Not on file  Food Insecurity: No Food Insecurity (08/08/2024)   Epic    Worried About Programme Researcher, Broadcasting/film/video in the Last Year: Never true    Ran Out of Food in the Last Year: Never true  Transportation Needs: No Transportation Needs (08/08/2024)   Epic    Lack of Transportation (Medical): No    Lack of Transportation (Non-Medical): No  Physical Activity: Not on file  Stress: Not on file  Social Connections: Not on file  Intimate Partner Violence: Not on file  Depression (EYV7-0): Low Risk (08/06/2024)   Depression (PHQ2-9)    PHQ-2 Score: 0  Alcohol Screen: Not on file  Housing: Low Risk (08/08/2024)   Epic    Unable to Pay for Housing in the Last Year: No    Number of Times Moved in the Last Year: 0    Homeless in the Last Year: No  Utilities: Not  At Risk (08/08/2024)   Epic    Threatened with loss of utilities: No  Health Literacy: Not on file     Family History  Problem Relation Age of Onset   Hypertension Mother    Breast cancer Mother    Heart disease Mother    Diabetes Father    Heart disease Father    Diabetes Paternal Grandmother    Diabetes Paternal Grandfather     Current Medications[2]   Physical exam: There were no vitals filed for this visit. Physical Exam Cardiovascular:     Rate and Rhythm: Normal rate and regular rhythm.     Heart sounds: Normal heart sounds.  Pulmonary:     Effort: Pulmonary effort is normal.     Breath sounds: Normal breath sounds.  Abdominal:     General: Bowel sounds are normal.     Palpations: Abdomen is soft.     Comments: No palpable hepatosplenomegaly  Lymphadenopathy:     Comments: No palpable cervical, supraclavicular, axillary or inguinal  adenopathy    Skin:    General: Skin is warm and dry.  Neurological:     Mental Status: She is alert and oriented to person, place, and time.           Latest Ref Rng & Units 08/06/2024    9:46 AM  CMP  Glucose 70 - 99 mg/dL 87   BUN 6 - 23 mg/dL 20   Creatinine 9.59 - 1.20 mg/dL 9.12   Sodium 864 - 854 mEq/L 140   Potassium 3.5 - 5.1 mEq/L 3.8   Chloride 96 - 112 mEq/L 105   CO2 19 - 32 mEq/L 30   Calcium 8.4 - 10.5 mg/dL 9.7   Total Protein 6.0 - 8.3 g/dL 7.1   Total Bilirubin 0.2 - 1.2 mg/dL 0.7   Alkaline Phos 39 - 117 U/L 65   AST 5 - 37 U/L 19   ALT 3 - 35 U/L 9       Latest Ref Rng & Units 08/06/2024    9:46 AM  CBC  WBC 4.0 - 10.5 K/uL 1.9 Repeated and verified X2.   Hemoglobin 12.0 - 15.0 g/dL 9.2   Hematocrit 63.9 - 46.0 % 28.6   Platelets 150.0 - 400.0 K/uL 261.0       Assessment and plan- Patient is a 64 y.o. female referred for neutropenia and normocytic anemia  Assessment and Plan    Neutropenia Severe neutropenia (ANC 0.6) suggests significant immunocompromise. Differential includes marrow production defect versus peripheral destruction. High infection risk necessitates prompt evaluation for marrow pathology. - Ordered repeat CBC to reassess leukocyte and neutrophil counts. - Requested pathologist review of peripheral blood smear for abnormal cells. - Ordered flow cytometry to evaluate for malignant or abnormal cell populations. - Initiated autoimmune workup to assess for immune-mediated neutropenia. - Provided education regarding infection risk; advised mask use and avoidance of sick contacts. - Instructed her to seek urgent evaluation for fever or signs of infection. - Discussed outpatient bone marrow biopsy with moderate sedation if neutropenia persists. - Scheduled follow-up in two weeks to review results and reassess.  Normocytic anemia Normocytic anemia (hemoglobin 9.2) concurrent with neutropenia and normal platelets suggests possible bone  marrow dysfunction or systemic process. Etiology undetermined, considering production defect or immune-mediated destruction.  I am also checking additional anemia workup including ferritin and iron studies B12 folate LDH reticulocyte count haptoglobin myeloma panel and serum free light chains. - Ordered flow cytometry as part of hematologic  evaluation. - Initiated autoimmune workup for possible immune-mediated anemia. - Discussed possible bone marrow biopsy if anemia persists or worsens.         Thank you for this kind referral and the opportunity to participate in the care of this  Patient   Visit Diagnosis 1. Other neutropenia   2. Normocytic anemia     Dr. Annah Skene, MD, MPH [facility] [phone] 08/09/2024                     [1]  Allergies Allergen Reactions   Flagyl  [Metronidazole ] Nausea And Vomiting   Macrobid   [Nitrofurantoin ] Nausea Only   Other     Medrol  Dosepak - pt stated, Made face feel like it was burning from the inside out; face was blood red  [2]  Current Outpatient Medications:    estradiol  (ESTRACE ) 0.01 % CREA vaginal cream, Apply 1/2 gram 2-3 times weekly, Disp: 42.5 g, Rfl: 1   hydrochlorothiazide  (HYDRODIURIL ) 25 MG tablet, Take 1 tablet (25 mg total) by mouth daily. for blood pressure., Disp: 90 tablet, Rfl: 3   olmesartan  (BENICAR ) 40 MG tablet, Take 1 tablet (40 mg total) by mouth daily. for blood pressure., Disp: 90 tablet, Rfl: 3   omeprazole  (PRILOSEC) 40 MG capsule, Take 1 capsule (40 mg total) by mouth daily. for heartburn., Disp: 90 capsule, Rfl: 3  "

## 2024-08-09 NOTE — Progress Notes (Signed)
 New patient; URGENT- Leukopenia, unspecified type/Anemia, unspecified type- referred by Comer Gaskins.

## 2024-08-09 NOTE — Addendum Note (Signed)
 Addended by: Megen Madewell on: 08/09/2024 02:35 PM   Modules accepted: Orders

## 2024-08-10 LAB — ANA COMPREHENSIVE PANEL
Anti JO-1: <0.2 AI (ref 0.0–0.9)
Centromere Ab Screen: <0.2 AI (ref 0.0–0.9)
Chromatin Ab SerPl-aCnc: <0.2 AI (ref 0.0–0.9)
ENA SM Ab Ser-aCnc: <0.2 AI (ref 0.0–0.9)
Ribonucleic Protein: <0.2 AI (ref 0.0–0.9)
SSA (Ro) (ENA) Antibody, IgG: 0.2 AI (ref 0.0–0.9)
SSB (La) (ENA) Antibody, IgG: <0.2 AI (ref 0.0–0.9)
Scleroderma (Scl-70) (ENA) Antibody, IgG: <0.2 AI (ref 0.0–0.9)
ds DNA Ab: <1 [IU]/mL (ref 0–9)

## 2024-08-10 LAB — HAPTOGLOBIN: Haptoglobin: 207 mg/dL (ref 37–355)

## 2024-08-12 LAB — MULTIPLE MYELOMA PANEL, SERUM
Albumin SerPl Elph-Mcnc: 4 g/dL (ref 2.9–4.4)
Albumin/Glob SerPl: 1.3 (ref 0.7–1.7)
Alpha 1: 0.3 g/dL (ref 0.0–0.4)
Alpha2 Glob SerPl Elph-Mcnc: 0.8 g/dL (ref 0.4–1.0)
B-Globulin SerPl Elph-Mcnc: 1.1 g/dL (ref 0.7–1.3)
Gamma Glob SerPl Elph-Mcnc: 1.1 g/dL (ref 0.4–1.8)
Globulin, Total: 3.3 g/dL (ref 2.2–3.9)
IgA: 256 mg/dL (ref 87–352)
IgG (Immunoglobin G), Serum: 1087 mg/dL (ref 586–1602)
IgM (Immunoglobulin M), Srm: 56 mg/dL (ref 26–217)
Total Protein ELP: 7.3 g/dL (ref 6.0–8.5)

## 2024-08-13 ENCOUNTER — Telehealth: Payer: Self-pay

## 2024-08-13 ENCOUNTER — Other Ambulatory Visit (HOSPITAL_COMMUNITY): Payer: Self-pay | Admitting: Student

## 2024-08-13 DIAGNOSIS — D72819 Decreased white blood cell count, unspecified: Secondary | ICD-10-CM

## 2024-08-13 LAB — COMP PANEL: LEUKEMIA/LYMPHOMA

## 2024-08-13 LAB — KAPPA/LAMBDA LIGHT CHAINS
Kappa free light chain: 26.2 mg/L — ABNORMAL HIGH (ref 3.3–19.4)
Kappa, lambda light chain ratio: 1.36 (ref 0.26–1.65)
Lambda free light chains: 19.2 mg/L (ref 5.7–26.3)

## 2024-08-13 NOTE — Telephone Encounter (Signed)
 Per Clarita 08/15/24 is not an available date for a bone marrow biopsy.  Outbound call to patient, informed of above. Reiterated NPO after midnight, would need a driver d/t moderate sedation being involved and [facility].  Patient asked if she would be okay to drive to dentist appointment later that day at 3pm and I told her she should be fine.  Patient verbalized understanding.

## 2024-08-13 NOTE — Telephone Encounter (Signed)
 Per Clarita she isn't suppose to drive for 24 hours due to the mod sedation. She will be given Versed  and Fentanyl  for her procedure. Outbound call to patient; informed of above. Patient indicated she will get her husband to take her to her dentist's appointment tomorrow.

## 2024-08-14 ENCOUNTER — Ambulatory Visit
Admission: RE | Admit: 2024-08-14 | Discharge: 2024-08-14 | Disposition: A | Discharge status: Home / Self Care | Source: Ambulatory Visit | Attending: Oncology | Admitting: Oncology

## 2024-08-14 ENCOUNTER — Other Ambulatory Visit: Payer: Self-pay

## 2024-08-14 ENCOUNTER — Encounter: Payer: Self-pay | Admitting: Radiology

## 2024-08-14 DIAGNOSIS — D709 Neutropenia, unspecified: Secondary | ICD-10-CM | POA: Insufficient documentation

## 2024-08-14 DIAGNOSIS — D72819 Decreased white blood cell count, unspecified: Secondary | ICD-10-CM

## 2024-08-14 DIAGNOSIS — D469 Myelodysplastic syndrome, unspecified: Secondary | ICD-10-CM | POA: Insufficient documentation

## 2024-08-14 DIAGNOSIS — D708 Other neutropenia: Secondary | ICD-10-CM

## 2024-08-14 LAB — CBC WITH DIFFERENTIAL/PLATELET
Abs Immature Granulocytes: 0 10*3/uL (ref 0.00–0.07)
Basophils Absolute: 0 10*3/uL (ref 0.0–0.1)
Basophils Relative: 0 %
Eosinophils Absolute: 0 10*3/uL (ref 0.0–0.5)
Eosinophils Relative: 0 %
HCT: 31.5 % — ABNORMAL LOW (ref 36.0–46.0)
Hemoglobin: 9.7 g/dL — ABNORMAL LOW (ref 12.0–15.0)
Immature Granulocytes: 0 %
Lymphocytes Relative: 70 %
Lymphs Abs: 1.2 10*3/uL (ref 0.7–4.0)
MCH: 27.5 pg (ref 26.0–34.0)
MCHC: 30.8 g/dL (ref 30.0–36.0)
MCV: 89.2 fL (ref 80.0–100.0)
Monocytes Absolute: 0.1 10*3/uL (ref 0.1–1.0)
Monocytes Relative: 5 %
Neutro Abs: 0.4 10*3/uL — CL (ref 1.7–7.7)
Neutrophils Relative %: 25 %
Platelets: 282 10*3/uL (ref 150–400)
RBC: 3.53 MIL/uL — ABNORMAL LOW (ref 3.87–5.11)
RDW: 15.9 % — ABNORMAL HIGH (ref 11.5–15.5)
Smear Review: NORMAL
WBC: 1.7 10*3/uL — ABNORMAL LOW (ref 4.0–10.5)
nRBC: 0 % (ref 0.0–0.2)

## 2024-08-14 LAB — PATHOLOGIST SMEAR REVIEW

## 2024-08-14 MED ORDER — SODIUM CHLORIDE 0.9 % IV SOLN: INTRAVENOUS | Status: DC

## 2024-08-14 MED ORDER — FENTANYL CITRATE (PF) 100 MCG/2ML IJ SOLN
INTRAMUSCULAR | Status: AC
  Filled 2024-08-14: qty 2

## 2024-08-14 MED ORDER — MIDAZOLAM HCL (PF) 2 MG/2ML IJ SOLN
INTRAMUSCULAR | Status: AC | PRN
  Administered 2024-08-14: 1 mg via INTRAVENOUS

## 2024-08-14 MED ORDER — LIDOCAINE 1 % OPTIME INJ - NO CHARGE
10.0000 mL | Freq: Once | INTRAMUSCULAR | Status: AC
  Administered 2024-08-14: 10 mL via INTRADERMAL

## 2024-08-14 MED ORDER — HEPARIN SOD (PORK) LOCK FLUSH 100 UNIT/ML IV SOLN
INTRAVENOUS | Status: AC
  Filled 2024-08-14: qty 5

## 2024-08-14 MED ORDER — FENTANYL CITRATE (PF) 100 MCG/2ML IJ SOLN
INTRAMUSCULAR | Status: AC | PRN
  Administered 2024-08-14 (×2): 50 ug via INTRAVENOUS

## 2024-08-14 MED ORDER — MIDAZOLAM HCL 2 MG/2ML IJ SOLN
INTRAMUSCULAR | Status: AC
  Filled 2024-08-14: qty 2

## 2024-08-14 NOTE — Discharge Instructions (Signed)
 Bone Marrow Aspiration and Bone Marrow Biopsy, Adult, Care After This sheet gives you information about how to care for yourself after your procedure. If you have problems or questions, contact your health care provider.  What can I expect after the procedure?  After the procedure, it is common to have: Mild pain and tenderness. Swelling. Bruising.  Follow these instructions at home: Take over-the-counter or prescription medicines only as told by your health care provider. You may shower tomorrow Remove band aid tomorrow, replace with another bandaid if  site has any drainage from biopsy site. Wash your hands with soap and water before you touch your biopsy site  If soap and water are not available, use hand sanitizer. Change your dressing frequently for bleeding and/or drainage. Check your puncture site every day for signs of infection. Check for: More redness, swelling, or pain. More fluid or blood. Warmth. Pus or a bad smell. Return to your normal activities in 24hours.  Do not drive for 24 hours if you were given a medicine to help you relax (sedative). Keep all follow-up visits as told by your health care provider. This is important. Contact a health care provider if: You have more redness, swelling, or pain around the puncture site. You have more fluid or blood coming from the puncture site. Your puncture site feels warm to the touch. You have pus or a bad smell coming from the puncture site. You have a fever. Your pain is not controlled with medicine. This information is not intended to replace advice given to you by your health care provider. Make sure you discuss any questions you have with your health care provider. Document Released: 01/21/2005 Document Revised: 01/22/2016 Document Reviewed: 12/16/2015 Elsevier Interactive Patient Education  2018 [employer].

## 2024-08-14 NOTE — Procedures (Signed)
 Interventional Radiology Procedure:   Indications: Leukopenia   Procedure: Image guided bone marrow biopsy  Findings: 2 aspirates and 1 core from right ilium  Complications: None     EBL: Minimal, less than 10 ml  Plan: Discharge to home in one hour.   Terrin Meddaugh R. Philip, MD  Pager: [phone]

## 2024-08-14 NOTE — H&P (Signed)
 "   Chief Complaint:  Leukopenia  Procedure: Bone Marrow Biopsy with Aspiration  Referring Provider(s): Dr. Annah Skene  Supervising Physician: Philip Cornet  Patient Status: [facility] - Out-pt  History of Present Illness: Raven Romero is a 64 y.o. female with a history of lichen sclerosis who was recently referred to Heme/Onc after she was noted to have a critical value of WBC 1.9 at her annual. Additional labwork significant for H/H for 9.2/28.6, ANC of 0.6, negative rheumatoid factor, and elevated kappa free light chain of 26.2. Given her severe neutropenia of unclear origin, patient has been referred to IR for bone marrow biopsy for diagnostic purposes.   She presents today with her husband. States that she has been in her usual state of health even at the time of her annual exam. She was surprised to learn that her labs were abnormal. She denies any fevers/chills, nausea/vomiting, pain anywhere, chest pain, shortness of breath, weakness/fatigue out of the ordinary. NPO since midnight. All questions and concerns answered at the bedside.   Patient is Full Code  Past Medical History:  Diagnosis Date   Breast mass, right    Cervical polyp 04/09/2015   2 small polyps on ultrasound, smaller on US  05/18/21.   E-coli UTI 09/26/2023   Hypertension    Kidney stone 04/10/2020   Kidney stones    Lichen sclerosus    Long-term use of Plaquenil 06/06/2017   Morphea    Shingles     Past Surgical History:  Procedure Laterality Date   BREAST EXCISIONAL BIOPSY Right 06/2015   BREAST LUMPECTOMY WITH RADIOACTIVE SEED LOCALIZATION Right 06/25/2015   Procedure: RIGHT BREAST SEED LOCALIZATION LUMPECTOMY ;  Surgeon: Debby Shipper, MD;  Location: [facility];  Service: General;  Laterality: Right;   CYSTECTOMY  1999   ovarian cystectomy   EXTRACORPOREAL SHOCK WAVE LITHOTRIPSY Left 06/17/2019   Procedure: EXTRACORPOREAL SHOCK WAVE LITHOTRIPSY (ESWL);  Surgeon: Renda Glance, MD;   Location: [facility];  Service: Urology;  Laterality: Left;   fractured arm Right 01/04/2021   NASAL SINUS SURGERY     TUBAL LIGATION     BTSP    Allergies: Flagyl  [metronidazole ], Macrobid   [nitrofurantoin ], and Other  Medications: Prior to Admission medications  Medication Sig Start Date End Date Taking? Authorizing Provider  hydrochlorothiazide  (HYDRODIURIL ) 25 MG tablet Take 1 tablet (25 mg total) by mouth daily. for blood pressure. 08/06/24  Yes Clark, Katherine K, NP  olmesartan  (BENICAR ) 40 MG tablet Take 1 tablet (40 mg total) by mouth daily. for blood pressure. 08/06/24  Yes Gretta Comer POUR, NP  omeprazole  (PRILOSEC) 40 MG capsule Take 1 capsule (40 mg total) by mouth daily. for heartburn. 08/06/24  Yes Gretta Comer POUR, NP  estradiol  (ESTRACE ) 0.01 % CREA vaginal cream Apply 1/2 gram 2-3 times weekly 08/06/24   Gretta Comer POUR, NP     Family History  Problem Relation Age of Onset   Hypertension Mother    Breast cancer Mother    Heart disease Mother    Diabetes Father    Heart disease Father    Diabetes Paternal Grandmother    Diabetes Paternal Grandfather     Social History   Socioeconomic History   Marital status: Married    Spouse name: Kresta Templeman   Number of children: 1   Years of education: Not on file   Highest education level: Not on file  Occupational History   Not on file  Tobacco Use   Smoking status: Never  Smokeless tobacco: Never  Vaping Use   Vaping status: Never Used  Substance and Sexual Activity   Alcohol use: Yes    Alcohol/week: 3.0 standard drinks of alcohol    Types: 3 Glasses of wine per week    Comment: weekly   Drug use: No   Sexual activity: Yes    Partners: Male    Birth control/protection: Surgical    Comment: btsp  Other Topics Concern   Not on file  Social History Narrative   Married.    1 child, 2 grandchildren.   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   Enjoys traveling to the beach.    Social Drivers of Health   Tobacco Use: Low  Risk (08/14/2024)   Patient History    Smoking Tobacco Use: Never    Smokeless Tobacco Use: Never    Passive Exposure: Not on file  Financial Resource Strain: Not on file  Food Insecurity: No Food Insecurity (08/09/2024)   Epic    Worried About Programme Researcher, Broadcasting/film/video in the Last Year: Never true    Ran Out of Food in the Last Year: Never true  Transportation Needs: No Transportation Needs (08/09/2024)   Epic    Lack of Transportation (Medical): No    Lack of Transportation (Non-Medical): No  Physical Activity: Not on file  Stress: Not on file  Social Connections: Not on file  Depression (PHQ2-9): Low Risk (08/09/2024)   Depression (PHQ2-9)    PHQ-2 Score: 0  Alcohol Screen: Not on file  Housing: Low Risk (08/09/2024)   Epic    Unable to Pay for Housing in the Last Year: No    Number of Times Moved in the Last Year: 0    Homeless in the Last Year: No  Utilities: Not At Risk (08/09/2024)   Epic    Threatened with loss of utilities: No  Health Literacy: Not on file     Review of Systems Patient denies any headache, chest pain, shortness of breath, abdominal pain, N/V, or fever/chills. All other systems are negative.   Vital Signs: BP 111/70   Pulse 71   Temp 98.1 F (36.7 C) (Temporal)   Resp 14   Ht 5' (1.524 m)   Wt 159 lb (72.1 kg)   LMP 07/18/2010   SpO2 99%   BMI 31.05 kg/m     Physical Exam Vitals reviewed.  Constitutional:      Appearance: Normal appearance.  HENT:     Mouth/Throat:     Mouth: Mucous membranes are moist.     Pharynx: Oropharynx is clear.  Cardiovascular:     Rate and Rhythm: Normal rate and regular rhythm.     Heart sounds: Normal heart sounds.  Pulmonary:     Effort: Pulmonary effort is normal.     Breath sounds: Normal breath sounds.  Abdominal:     General: Abdomen is flat.     Palpations: Abdomen is soft.     Tenderness: There is no abdominal tenderness.  Skin:    General: Skin is warm and dry.  Neurological:     Mental Status:  She is alert and oriented to person, place, and time.  Psychiatric:        Behavior: Behavior normal.     Imaging: US  LT LOWER EXTREM LTD SOFT TISSUE NON VASCULAR Result Date: 08/12/2024 CLINICAL DATA:  Palpable masses in the left calf and foot for 4 months. No reported acute injury. EXAM: ULTRASOUND LEFT LOWER EXTREMITY LIMITED TECHNIQUE: Ultrasound examination of the lower  extremity soft tissues was performed in the area of clinical concern. COMPARISON:  Foot radiographs 07/21/2021. FINDINGS: There is a complex fluid collection superficially in the dorsal aspect of the left ankle and foot, tracking into the great toe. This measures approximately 6.5 cm in length and measures up to 1.5 x 0.9 cm transverse. Appearance is most consistent with tenosynovitis, likely involving the extensor hallucis longus tendon based on extension to the great toe. No typical ganglia are seen. There is no evidence of soft tissue mass. Limited imaging of the left calf was performed, demonstrating no focal abnormality. IMPRESSION: 1. Complex fluid collection in the dorsal aspect of the left ankle and foot, most consistent with tenosynovitis, likely involving the extensor hallucis longus tendon based on extension to the great toe. Correlate clinically. This could be better evaluated with MRI, as clinically warranted. 2. No evidence of soft tissue mass. 3. No significant abnormality identified in the left calf on limited imaging. Electronically Signed   By: Elsie Perone M.D.   On: 08/12/2024 08:04    Labs:  CBC: Recent Labs    08/06/24 0946 08/09/24 1419  WBC 1.9 Repeated and verified X2.* 2.7*  HGB 9.2* 9.1*  HCT 28.6* 29.2*  PLT 261.0 272    COAGS: Recent Labs    08/09/24 1419  INR 1.0  APTT 27    BMP: Recent Labs    08/06/24 0946 08/09/24 1419  NA 140 139  K 3.8 3.3*  CL 105 101  CO2 30 25  GLUCOSE 87 102*  BUN 20 26*  CALCIUM 9.7 10.4*  CREATININE 0.87 1.07*  GFRNONAA  --  58*    LIVER  FUNCTION TESTS: Recent Labs    08/06/24 0946 08/09/24 1419  BILITOT 0.7 1.1  AST 19 25  ALT 9 10  ALKPHOS 65 76  PROT 7.1 7.6  ALBUMIN 4.4 4.6    TUMOR MARKERS: No results for input(s): AFPTM, CEA, CA199, CHROMGRNA in the last 8760 hours.  Assessment and Plan:  Severe Neutropenia: Raven Romero is a 64 y.o. female with a history of lichen sclerosis who presents to [facility] Interventional Radiology department for an image-guided bone marrow biopsy with aspiration with Dr. DELENA Balder. Procedure to be performed under moderate sedation.  Risks and benefits of bone marrow biopsy was discussed with the patient and/or patient's family including, but not limited to bleeding, infection, damage to adjacent structures or low yield requiring additional tests.  All of the questions were answered and there is agreement to proceed.  Consent signed and in chart.   Thank you for allowing our service to participate in Raven Romero 's care.    Electronically Signed: Brittiany Wiehe M Patrich Heinze, PA-C   08/14/2024, 9:15 AM     I spent a total of  30 Minutes in face to face in clinical consultation, greater than 50% of which was counseling/coordinating care for bone marrow biopsy with aspiration. "

## 2024-08-14 NOTE — Progress Notes (Signed)
 Patient clinically stable post IR BMB per Dr Philip, tolerated well. Vitals stable post procedure. Received Versed  1 mg along with Fentanyl  50 mcg IV for procedure. Report given to Ruthanna Kitty RN post procedure/specials/18

## 2024-08-15 ENCOUNTER — Other Ambulatory Visit: Admitting: Radiology

## 2024-08-16 ENCOUNTER — Encounter: Payer: Self-pay | Admitting: Oncology

## 2024-08-16 ENCOUNTER — Ambulatory Visit: Payer: Self-pay | Admitting: Oncology

## 2024-08-16 LAB — SURGICAL PATHOLOGY

## 2024-08-19 ENCOUNTER — Other Ambulatory Visit: Payer: Self-pay | Admitting: Primary Care

## 2024-08-19 DIAGNOSIS — R051 Acute cough: Secondary | ICD-10-CM

## 2024-08-19 DIAGNOSIS — K219 Gastro-esophageal reflux disease without esophagitis: Secondary | ICD-10-CM

## 2024-08-19 DIAGNOSIS — I10 Essential (primary) hypertension: Secondary | ICD-10-CM

## 2024-08-20 ENCOUNTER — Inpatient Hospital Stay: Attending: Oncology | Admitting: Oncology

## 2024-08-20 ENCOUNTER — Encounter (HOSPITAL_COMMUNITY): Payer: Self-pay

## 2024-08-20 ENCOUNTER — Encounter: Payer: Self-pay | Admitting: Oncology

## 2024-08-20 VITALS — BP 111/68 | HR 98 | Temp 97.4°F | Resp 18 | Ht 60.0 in | Wt 162.5 lb

## 2024-08-20 DIAGNOSIS — D46Z Other myelodysplastic syndromes: Secondary | ICD-10-CM | POA: Insufficient documentation

## 2024-08-20 DIAGNOSIS — Z7189 Other specified counseling: Secondary | ICD-10-CM | POA: Diagnosis not present

## 2024-08-20 MED ORDER — LIDOCAINE-PRILOCAINE 2.5-2.5 % EX CREA: TOPICAL_CREAM | CUTANEOUS | 3 refills | Status: AC

## 2024-08-20 MED ORDER — VALACYCLOVIR HCL 500 MG PO TABS: 500.0000 mg | ORAL_TABLET | Freq: Two times a day (BID) | ORAL | 3 refills | Status: AC

## 2024-08-20 MED ORDER — ONDANSETRON HCL 8 MG PO TABS: 8.0000 mg | ORAL_TABLET | Freq: Three times a day (TID) | ORAL | 1 refills | Status: AC | PRN

## 2024-08-20 MED ORDER — LEVOFLOXACIN 750 MG PO TABS: 750.0000 mg | ORAL_TABLET | Freq: Every day | ORAL | 3 refills | Status: AC

## 2024-08-20 MED ORDER — PROCHLORPERAZINE MALEATE 10 MG PO TABS: 10.0000 mg | ORAL_TABLET | Freq: Four times a day (QID) | ORAL | 1 refills | Status: AC | PRN

## 2024-08-20 NOTE — Progress Notes (Signed)
 Patient doing okay; no new or acute concerns at this time.

## 2024-08-20 NOTE — Telephone Encounter (Signed)
 Per Dr. Melanee Please send myeloid mutation panel and make sure fish for MDS is pending.  BM BX done 08/14/24.  Spoke to Natalie; fish for MDS was added, myeloid disorders already in process.

## 2024-08-20 NOTE — Telephone Encounter (Signed)
 Per Dr. Melanee Can you ask her if she would be willing to come to see me today as my last patient at 330?Raven Romero  Outbound call to patient informed of above as well as my chart message sent just a few minutes ago indicating additional testing has been added to bone marrow biopsy.  Patient agreed to come today at 3:30pm.

## 2024-08-20 NOTE — Progress Notes (Signed)
 START ON PATHWAY REGIMEN - [facility]     A cycle is every 28 days:     Azacitidine    **Always confirm dose/schedule in your pharmacy ordering system**  Patient Characteristics: Higher-Risk, First Line, Transplant Candidate Did cytogenetic and molecular analysis reveal an isolated del(5q) or del(5q) with one other cytogenetic abnormality except monosomy 7 or 7q deletion with no concomitant TP53 mutations<= No Line of therapy: First Line Patient Characteristics: Transplant Candidate Intent of Therapy: Non-Curative / Palliative Intent, Discussed with Patient

## 2024-08-20 NOTE — Progress Notes (Signed)
"   Raven Romero confirmed that cytogenetics is automatically added to all bone marrow biopsies.  "

## 2024-08-21 ENCOUNTER — Other Ambulatory Visit: Payer: Self-pay

## 2024-08-21 ENCOUNTER — Other Ambulatory Visit: Payer: Self-pay | Admitting: Primary Care

## 2024-08-21 DIAGNOSIS — I10 Essential (primary) hypertension: Secondary | ICD-10-CM

## 2024-08-21 DIAGNOSIS — D46Z Other myelodysplastic syndromes: Secondary | ICD-10-CM

## 2024-08-21 NOTE — Telephone Encounter (Addendum)
 [facility] is out of network for patient.  Dr. Melanee has been in communication with Dr. Miquel Labor at [facility]; Dr. Labor indicated he can see patient as soon as tomorrow.  Referral paperwork faxed and transmitted successfully. Outbound call to patient; informed of above. Provided telephone number to [facility] [phone]

## 2024-08-21 NOTE — Progress Notes (Signed)
 " HEMATOLOGIC MALIGNANCIES & CELLULAR THERAPY  HEMATOLOGIC MALIGNANCIES CLINIC NEW PATIENT EVALUATION   NAME: Raven Romero MEDICAL RECORD NUMBER: [id number] DATE OF BIRTH: [birth year]  DATE OF SERVICE: 08/22/2024  IDENTIFYING STATEMENT: Raven Romero is a 64 year old woman from [place] [address] with a preliminary diagnosis of myelodysplastic neoplasm with increased blasts-2 (MDS-IB2) diagnosed via bone marrow biopsy on 08/14/2024. Myeloid gene panel by NGS is currently pending. She is referred for a second opinion. She is accompanied to the visit by her husband, Raven Romero.  Her daughter was available by phone during the consultation.  Raven Romero encouraged them to record the consultation, which the husband did on his cell phone.  Physician Requesting Consultation: Raven Raven BROCKS, MD  HISTORY OF PRESENT ILLNESS:   Raven, Romero has hypertension, hyperlipidemia, gastroesophageal reflux disease, lichen sclerosis and morphea overlap. On 06/17/2024, Raven Romero presented to the [facility] Emergency Room with two near syncopal events that were associated with shortness of breath. She reported intermittent dizziness without prodromal symptoms that had occurred twice in the last week.  At the time she was seen at the ED, these symptoms had resolved. Cardiac and pulmonary workup with troponins, EKG and chest X-ray was unremarkable. Symptoms were attributed to likely orthostasis vs peripheral lesions such as vestibular neuritis. However, labwork was notable for WBC 3.4, ANC 1.8, HGB 9.5, MCV 84.4 and PLT 239,000. She denied rectal or vaginal bleeding. She is up to date with colonoscopy for colon cancer screening (with last one 2023). She had repeat labwork done by her primary care team on 08/06/2024, which showed significantly worsened leukopenia and neutropenia with ANC 0.6 as well as persistent anemia with HGB 9.2. Autoimmune workup including cyclic citrullinated peptide antibody, and rheumatoid factor were unremarkable. She was  then referred to Hematology for further work-up. She was seen by Raven Romero at [facility]) on 08/09/2024. Workup including SPEP, serum free light chains, vitamin B12, folate, haptoglobin, TSH and ANA unremarkable.  Fibrinogen 378, LDH 220, ferritin 160 and absolute reticulocyte 88.2.  Peripheral blood flow cytometry with no significant immunophenotypic abnormality detected. She was feeling well when she saw Raven Raven, who subsequently ordered a bone marrow biopsy.   11/03/2014: WBC 6.7, MCV 96.9, HGB 11.1, PLT 354,000, TIBC 341, 30% iron saturation 06/22/2015: WBC 6.5, ANC 4.2, HGB 12.2, MCV 95.5, PLT 337,000 12/01/2020: WBC 5.0, HGB 13.0, PLT 293,000 02/02/2022: WBC 3.2, HGB 11.7, PLT 268,000 06/17/2024: WBC 3.4, ANC 1.8,  MCV 84.4, HGB 9.5,  PLT 239,000 08/06/2024: WBC 1.9, ANC 0.6, MCV 87.0, HGB 9.2, PLT 261,000 08/09/2024: WBC 2.7, ANC 0.8, MCV 90.1, HGB 9.1, PLT 272,000 08/14/2024: WBC 1.7, ANC 0.4, MCV 89.2, HGB 9.7, PLT 282,000   Bone marrow [facility] and aspir[facility] Myelodysplastic syndrome with increased blasts-2 (MDS-IB2). The bone marrow is hypercellular (50%) with evidence of myeloid (hypogranularity) and megakaryocytic (hypobulation) dysplasia and equivocal dyserythropoiesis. In addition, there are increased blasts, which though not readily morphologically identified on the aspirate smear slides, are highlighted by CD34 positive immunohistochemistry at 12% on a 200 cell ?10finger count and confirmed by flow cytometry, which also identified in 12% of all nucleated cells as CD34 positive blasts coexpressing CD117, HLA-Raven, partial MPO, CD38 and CD200. Cytogenetics with a normal female karyotype: 46, XX[20]. Myeloid gene panel by NGS currently pending. Peripheral blood demonstrated a leukopenia with absolute neutropenia and a normochromic normocytic anemia. Of the limited neutrophils present on the smear, the vast majority  appear hypogranular consistent with dysgranulopoiesis.   Seen again by Raven Raven on 08/20/2024 where the diagnosis of myelodysplastic neoplasm with increased blasts-2 (MDS-IB2) was discussed. She was prescribed levofloxacin  750mg  daily and valacyclovir  500mg  twice daily and tentatively planned to start azacitidine monotherapy on 08/29/2024, pending second opinion input from a tertiary center. Of note, she has not yet started the levofloxacin  and valacyclovir  as she was concerned about potential adverse effects.  She notes that prior to her presentation to the Emergency Room on 06/26/2024, she had developed a sinus infection around Thanksgiving that was treated with amoxicillin . She again developed another sinus infection around [place] eve coupled with persistent cough and sinus symptoms that lasted 2 weeks. She was again treated with amoxicillin  with resolution. Does note a history of sinus infections and also urinary tract infections. Last urinary tract infection in April 2025, but states she has never felt completely right since although denies dysuria or urgency or burning with urination. She denies any other infections, fevers, chills or drenching night sweats. No unintentional weight loss. She was taking ozempic for quite some time last year, but discontinued it after her compounding pharmacy closed down. She recently restarted it on 08/04/2024 with another compounding pharmacy and is asking if she can continue taking it.   She noted some bumps/lumps on the dorsal aspect of her left foot a couple of months ago. Intermittently associated with some pain and discomfort. She feels like they increase and decrease in size. She has an ultrasound done on the left lower extremity on 08/12/2024:  Ultrasound left lower extremity limited: Complex fluid collection superficially in the dorsal aspect of the left ankle and foot, tracking into the great toe. This measures approximately 6.5cm in length and measures up  to 1.5 x 0.9cm transverse. Appearance is most consistent with tenosynovitis likely involving the extensor hallucis longus tendon based on extension of the great toe. No evidence of soft tissue mass. No significant abnormality identified in the left calf on limited imaging. An MRI is currently planned to further evaluate. She has aches and pains after doing stuff that resolve with rest.   For the history of morphea: diagnosed via skin biopsy of the chest/breast area on 03/22/2017 consistent with dermal fibrosclerosis with chronic inflammation (morphea). She noticed it more prominently after a left breast biopsy with radioactive marker for a mass that turned out to be benign. Started treatment with hydroxychloroquine in December 2018 until 12/2018 and topical steroids.  Has had light therapy (UVB) for morphea starting 03/2018 to sometime in 2020. She is currently not any therapy for the morphea or lichen sclerosus, and she feels her disease is controlled. She follows with Dermatology and Gynecology.   She otherwise denies any fatigue. She is independent in all activities of daily living. No chest pain, shortness of breath, nausea, vomiting. No blood in stool. No melena. No abdominal pain, but has some constipation. No diarrhea. No lightheadedness, dizziness or lower extremity swelling. No blurry or double vision.      PAST MEDICAL HISTORY:  Past Medical History:  Diagnosis Date   Gastroesophageal reflux disease    Hyperlipidemia    Hypertension    Lichen sclerosus et atrophicus    Morphea    Myelodysplastic syndrome (CMS/HHS-HCC)     PAST SURGICAL HISTORY:  Past Surgical History:  Procedure Laterality Date   Arm surgery     BREAST BIOPSY     FUNCTIONAL ENDOSCOPIC SINUS SURGERY      CURRENT MEDICATIONS: Current Outpatient Medications  Medication Sig Dispense Refill   cholecalciferol (VITAMIN D3) 2,000 unit tablet Take 2,000 Units by mouth once daily     hydroCHLOROthiazide   (HYDRODIURIL ) 25 MG tablet TAKE 1 TABLET(25 MG) BY MOUTH EVERY DAY 30 tablet 0   olmesartan  (BENICAR ) 40 MG tablet Take 1 tablet (40 mg total) by mouth once daily 30 tablet 0   omeprazole  (PRILOSEC) 40 MG Raven capsule Take 40 mg by mouth once daily.     SEMAGLUTIDE SUBQ Inject subcutaneously once a week     desonide (DESOWEN) 0.05 % cream desonide 0.05 % topical cream     estradioL  (ESTRACE ) 0.01 % (0.1 mg/gram) vaginal cream estradiol  0.01% (0.1 mg/gram) vaginal cream  USE 1/2 GRAM VAGINALLY 2-3 TIMES PER WEEK TO MAINTAIN SYMPTOM TO RELIEF AS DIRECTED     fluticasone  propionate (FLONASE ) 50 mcg/actuation nasal spray by Nasal route     ibuprofen 200 mg Cap Take by mouth as needed.     ketoconazole (NIZORAL) 2 % cream Apply to red, flaky areas of the face and/or ears 1-3 times a week as needed. (Patient not taking: Reported on 08/22/2024) 15 g 6   plecanatide (TRULANCE) 3 mg tablet Take by mouth (Patient not taking: Reported on 08/22/2024)     No current facility-administered medications for this visit.    ALLERGIES: Allergies  Allergen Reactions   Macrobid  [Nitrofurantoin  Monohyd/M-Cryst] Nausea   Metronidazole  Nausea And Vomiting    PSYCHO-SOCIAL HISTORY: Social History   Socioeconomic History   Marital status: Married  Tobacco Use   Smoking status: Never   Smokeless tobacco: Never  Substance and Sexual Activity   Alcohol use: Yes    Alcohol/week: 4.0 standard drinks of alcohol    Types: 4 Glasses of wine per week    Comment: 4-6    Drug use: No   Sexual activity: Yes  Social History Narrative   She is married and lives with her husband in [place], [place] (near [place], [place] 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 She has one child, daughter, Laymon- aged 31 (2026).    Social Drivers of Health   Food Insecurity: No Food Insecurity (08/09/2024)   Received from [facility]   Hunger Vital Sign    Within the past 12  months, you worried that your food would run out before you got the money to buy more.: Never true    Within the past 12 months, the food you bought just didn't last and you didn't have money to get more.: Never true  Transportation Needs: No Transportation Needs (08/09/2024)   Received from [facility] - Transportation    In the past 12 months, has lack of transportation kept you from medical appointments or from getting medications?: No    In the past 12 months, has lack of transportation kept you from meetings, work, or from getting things needed for daily living?: No  Housing Stability: Unknown (09/17/2023)   Housing Stability Vital Sign    Homeless in the Last Year: No    TRANSFUSION HISTORY:  FAMILY HISTORY: Family History  Problem Relation Name Age of Onset   Heart disease Mother     Heart failure Father     Other (Hip replacement) Brother         Garrel (age 79/2026)   Glaucoma Neg Hx     Macular degeneration Neg Hx      REVIEW OF SYSTEMS: Review of Systems  Constitutional:  Negative  for chills, diaphoresis, fever, malaise/fatigue and weight loss.  HENT:  Negative for congestion, nosebleeds and sore throat.   Eyes:  Negative for blurred vision and double vision.  Respiratory:  Negative for cough, hemoptysis, sputum production and shortness of breath.   Cardiovascular:  Negative for chest pain, palpitations, orthopnea, claudication and leg swelling.  Gastrointestinal:  Positive for constipation. Negative for abdominal pain, blood in stool, diarrhea, melena, nausea and vomiting.  Genitourinary:  Negative for dysuria and hematuria.  Musculoskeletal:  Positive for joint pain and myalgias. Negative for back pain, falls and neck pain.  Skin:  Positive for rash. Negative for itching.  Neurological:  Negative for dizziness, tingling, weakness and headaches.  Endo/Heme/Allergies:  Does not bruise/bleed easily.  Psychiatric/Behavioral:  Negative for depression and  memory loss.      Objective:     PHYSICAL EXAMINATION: Vitals:   08/22/24 0911  BP: 116/69  Pulse: 82  Resp: 18  Temp: 36.7 C (98.1 F)  TempSrc: Oral  SpO2: 100%  Weight: 75.1 kg (165 lb 9.1 oz)  Height: 160 cm (5' 2.99)  PainSc: 0-No pain   Body surface area is 1.83 meters squared.  ECOG performance status: 0 - Asymptomatic   The sclerae are anicteric.  The pupils are equal, round and reactive to light.  The extraocular muscles are intact.  The oropharynx is clear without ulceration, petechiae or thrush.  There is no palpable cervical, axillary or inguinal adenopathy.  Normal work of breathing, but the lung sounds are  significantly diminished without wheezes, crackles, rhonchi, or pleural rub.  The heart sounds are clear.  There is a regular cardiac rhythm, without murmur, rub or gallop.  The abdomen is soft, non-tender and non-distended.  The bowel sounds are present and normal.  There is no hepatosplenomegaly or mass.  There is no edema in the bilateral ankles.  On the dorsal aspect of the left foot, there are two soft, palpable nodules There are not tender to palpation. There are no petechiae or ecchymoses.  She is alert and oriented.   There is dark (almost black) hyperpigmentation overlying the abdomen (chronic due to morphea).  The cranial nerves II through XII are intact.  The ankle dorsiflexor strength is 5/5 bilaterally.   LABORATORY STUDIES: Results for orders placed or performed in visit on 08/22/24  Disseminated Intravascular Coagulation (DIC) Screen  Result Value Ref Range   Prothrombin Time 10.6 9.5 - 13.1 sec   Prothrombin INR 0.9 0.9 - 1.1   Act Partial Thromboplastin Time 29.5 26.8 - 37.1 sec   Fibrinogen 308 213 - 435 mg/dL   D Dimer 8,315 (H) <499 ng/mL FEU   Narrative   PT/INR Reference Ranges: DVT/PE/PVD = INR 2.0 - 3.0 Mechanical Heart Valve = INR 2.5 - 3.5  NOTE: The INR is not a PT Ratio and is valid only for  Coumadin patients  APTT Therapeutic Range (Heparin ) = 65-95 seconds Note: The therapeutic range for Heparin  has been determined using ex-vivo whole blood samples and therapeutic Heparin  level of 0.30-0.70 anti-factor Xa units.  Fibrinogen The direct thrombin inhibitors (argatroban, bivalirudin, and lepirudin) have been reported to interfere with the clot-based fibrinogen assay used in the Coagulation Laboratory, resulting in decreased plasma fibrinogen levels. This occurs most prominently with argatroban, followed by bivalirudin, and then lepirudin.  D-Dimer For use in the exclusion of pulmonary embolism and venous thromboembolism  REFERENCE RANGE: < 500 ng/mL FEU CUT-OFF VALUE: < 500 ng/mL FEU = NEGATIVE >= 500 ng/mL FEU =  POSITIVE  A negative D Dimer in a patient with low clinical likelihood to have a DVT and/or PE can be used to exclude the diagnosis of DVT and/or PE.   Uric Acid  Result Value Ref Range   Uric Acid 7.2 (H) 2.6 - 6.0 mg/dL  Reticulocytes  Result Value Ref Range   Reticulocyte % 2.86 (H) 0.70 - 2.10 %   Reticulocyte Count /L 94.4 28.0 - 134.0 x10^9/L   Reticulocyte-He 20.5 (L) 29.2 - 38.8 pg   Immature Reticulocyte Fraction 24.5 (H) 3.1 - 16.0 %  Magnesium  Result Value Ref Range   Magnesium 1.5 (L) 1.8 - 2.9 mg/dL  Lactate Dehydrogenase (LDH)  Result Value Ref Range   Lactate Dehydrogenase (LDH) 167 <247 U/L  Comprehensive Metabolic Panel (CMP)  Result Value Ref Range   Sodium 138 136 - 145 mmol/L   Potassium 3.7 3.5 - 5.0 mmol/L   Chloride 104 98 - 107 mmol/L   Carbon Dioxide (CO2) 27 21 - 31 mmol/L   Urea  Nitrogen (BUN) 20 6 - 20 mg/dL   Creatinine 1.0 0.6 - 1.2 mg/dL   Glucose 895 70 - 859 mg/dL   Calcium 9.9 8.6 - 89.6 mg/dL   AST (Aspartate Aminotransferase) 18 13 - 39 U/L   ALT (Alanine Aminotransferase) 10 7 - 52 U/L   Bilirubin, Total 1.1 (H) 0.3 - 1.0 mg/dL   Alk Phos (Alkaline Phosphatase) 64 34 - 104 U/L   Albumin 4.5 3.5 - 5.2  g/dL   Protein, Total 7.3 6.0 - 8.3 g/dL   Anion Gap 7 3 - 12 mmol/L   BUN/CREA Ratio 20 6 - 27   Glomerular Filtration Rate (eGFR)  63 mL/min/1.73sq m  Complete Blood Count (CBC) with Differential  Result Value Ref Range   WBC (White Blood Cell Count) 2.3 (L) 3.2 - 9.8 x109/L   Hemoglobin 9.0 (L) 11.7 - 15.5 g/dL   Hematocrit 70.2 (L) 64.9 - 45.0 %   Platelets 216 150 - 450 x109/L   MCV (Mean Corpuscular Volume) 90 80 - 98 fL   MCH (Mean Corpuscular Hemoglobin) 27.3 26.5 - 34.0 pg   MCHC (Mean Corpuscular Hemoglobin Concentration) 30.3 (L) 31.0 - 36.0 %   RBC (Red Blood Cell Count) 3.30 (L) 3.77 - 5.16 x1012/L   RDW-CV (Red Cell Distribution Width) 15.3 (H) 11.5 - 14.5 %   NRBC (Nucleated Red Blood Cell Count) 0.00 0 x109/L   NRBC % (Nucleated Red Blood Cell %) 0.0 %   MPV (Mean Platelet Volume) 9.8 7.2 - 11.7 fL   Slide Review/Morphology Yes    Immature Platelet Fraction 5.4 1.6 - 8.5 %  Blood Film- Special Heme  Result Value Ref Range   Hematology Comment Blood film made and available in clinic   Manual White Blood Cell (WBC) Differential  Result Value Ref Range   Segmented Neutrophil % 42 37 - 80 %   Lymphocyte % 48 10 - 50 %   Monocyte % 8 0 - 12 %   Eosinophil %  1 0 - 7 %   Basophil % 1 0 - 2 %   Slide Review/Morphology Yes    Neutrophil Count, Absolute 0.97 (L) 2.00 - 8.60 x109/L   Total Absolute Neutrophil Count (Segs + Bands) 0.97 (L) 2.00 - 8.60 x109/L   Lymphocyte Count, Absolute 1.10 0.60 - 4.20 x109/L   Monocyte Count, Absolute 0.18 0.00 - 0.90 x109/L   Eosinophil Count, Absolute 0.02 0.00 - 0.70 x109/L   Basophil  Count, Absolute 0.02 0.00 - 0.20 x109/L  Type And Screen  Result Value Ref Range   ABO RH TYPE O Positive    Antibody Screen Negative    Specimen Outdate 08-25-2024 23:59    Performing Lab [facility] BLOOD BANK LAB    Peripheral blood film 08/22/2024:  Raven Romero and Raven Pennelope reviewed the peripheral blood film.  There were no blasts  identified on scan of the blood film, including both edges.  The neutrophils have marked cytoplasmic hypogranularity.  The red blood cells are hypochromic.  Impression/Plan:  IMPRESSION:  Therefore, Huda Petrey is a 64 year old woman with myelodysplastic neoplasm with increased blasts (MDS-IB2) and normal female karyotype.  The myeloid gene mutation panel by NGS is pending.  Bone marrow biopsy with multilineage dysplasia, in both the granulocytic and megakaryocytic lineages, and equivocal in the erythroid lineage. She has notable history of morphea and lichen sclerosis et atrophicus, currently well controlled and she is  off any systemic therapy. If she is confirmed to have MDS, she would be classified as high risk as per IPSS-R, which portends a median overall survival of 1.6 years and median time to 25% AML evolution of 1.4 years [PMID: 77259546].  With high risk MDS, she needs therapy both to prevent progression and decrease risk of AML transformation. If confirmed to have high risk MDS after myeloid NGS panel results, then allogeneic transplant would be the only curative option. On cursory review today, her performance status is excellent, and she would be a potential allogeneic transplant candidate.    We discussed potnetial therapeutic options other than standard azacitidine depending on the results of the mutation profile.  Since her blood counts have remained stable for two months, her platelet count is normal, she has stable grade 2 anemia without need for transfusion, and the ANC is above 500/mcL, we recommend delaying therapy pending the results of the myeloid gene panel.   We discussed that the myeloid gene panel could have targetable mutations eg NPM1 and IDH1/2 that can potentially lead to a change in diagnosis (i.e. if NPM1 mutation present, would be characterized as AML regardless of blast count) and/or therapeutic options. With some favorable molecular mutations, chemotherapy alone could be  curable, without need for transplant.  Clinical trial options could also be considered pending molecular characterization. Additionally, even if this were confirmed to be MDS, if we were treating with a goal of allogeneic transplant, we would attempt to lower the blast count quickly to less than 5% as is standard prior to proceeding with transplant. In that case, we would recommend azacitidine and venetoclax for faster cytoreduction compared to azacitidine alone, the latter is slower at clearing blasts than VEN/AZA combination. Note is taken of the recently completed VERONA trial that did NOT show an overall survival advantage of VEN/AZA over AZA alone in high risk MDS. However, we would only be using VEN/AZA for cytoreduction (for 1-2 cycles usually) to facilitate proceeding to allogeneic transplantation in the setting of high risk MDS. Raven Castagnola is currently clinically stable, she is asymptomatic, with a grade 2 anemia and neutropenia, but normal platelet count. She has not required any blood transfusions, and has no evidence of circulating blasts. She is in agreement with waiting until the myeloid gene panel by NGS results (by 08/31/2024 per conversation with [facility]).   The bone marrow pathology report from [facility] has some discordance with note of no blasts noted morphologically on aspirate smears (in an adequate aspirate sample with cellular  spicules), but 12% blasts on flow cytometry and CD34 immunohistochemistry of the core biopsy. We will ask for [facility] pathology review of the bone marrow slides from [facility]. Another bone marrow biopsy might be required pending [facility] pathology review. The patient is aware.    DISPOSITION/PLAN:  She verbalizes understanding of the plan and is in agreement.   She denies any questions or concerns at this time but does know to call the office in the interim should any issues arise. All questions were answered to their satisfaction.     Laboratory studies were reviewed with her today and her medications were reconciled and updated with a printed copy of their After Visit Summary provided to her.  PLAN:    Defer chemotherapy start until myeloid gene panel and FISH MDS panel result.  [facility] review of bone marrow slides from 08/14/2024 [facility] Slides have been requested by the [facility].  If a transplant candidate, would recommend initial cytoreduction with 1-2 cycles of VEN/AZA to get bone marrow blast percentage at 5% or less.  She is potentially clinical trial eligible pending full myeloid gene panel results.  Raven Romero will inform Raven Raven Skene of our recommendations as noted above as well further discussion of the therapy plan once the myeloid gene panel results. Return visit with Raven Romero pending myeloid gene panel results.  She will require referral to the Adult Blood and Bone Marrow Transplant team pending results above.  She can hold antiviral and antibacterial prophylaxis for now. Can start once she is on chemotherapy.  Given the presence of the tenosynovitis, we recommend holding levofloxacin  for now given the risk of tendonopathy with fluoroquinolones.  Her ANC remains above 500/cmL, albeit potential dysfunctional. Would advocate against levofloxacin  for antibacterial prophylaxis given question of left foot tendon inflammation on ultrasound. Fluoroquinolones are associated with tendinopathy and tendon rupture especially in high risk individuals with baseline tendon pathology. She has not started the levofloxacin  at this time.  Consider cefdinir 300mg  twice daily for antibacterial prophylaxis once she starts chemotherapy, although note is taken of its  lack of Pseudomonal coverage.    Agree with MRI of the left foot to further evaluate ?complex fluid collection and question of tenosynovitis on ultrasound. She does not appear systemically infected.  Continue follow-up with  Dermatology and Gynecology for the morphea and lichen sclerosus, respectively.   Fadzai Chinyengetere, MDPhD  PGY6, Hematology/Oncology Fellow   Attestation Statement:   I personally saw and evaluated the patient, and participated in the management and treatment plan as documented in the resident/fellow note.  MIQUEL DEWARD ORALIA, MD   "

## 2024-08-22 ENCOUNTER — Other Ambulatory Visit: Payer: Self-pay

## 2024-08-22 MED ORDER — OLMESARTAN MEDOXOMIL 40 MG PO TABS: 40.0000 mg | ORAL_TABLET | Freq: Every day | ORAL | 3 refills | Status: AC

## 2024-08-23 ENCOUNTER — Other Ambulatory Visit: Payer: Self-pay | Admitting: Oncology

## 2024-08-23 NOTE — Telephone Encounter (Signed)
 [facility] would like to wait for the NGS myeloid mutation panel, which is an additional test performed on the bone marrow biopsy, to result before initiating treatment. Results typically take about 2 weeks but may take up to 3 weeks. As treatment will be held until this information is available, we will cancel the appointments scheduled for the week of 2/16 and once results are received, reschedule accordingly.

## 2024-08-23 NOTE — Progress Notes (Signed)
 Pharmacist Chemotherapy Monitoring - Initial Assessment    Anticipated start date: 09/02/24   The following has been reviewed per standard work regarding the patient's treatment regimen: The patient's diagnosis, treatment plan and drug doses, and organ/hematologic function Lab orders and baseline tests specific to treatment regimen  The treatment plan start date, drug sequencing, and pre-medications Prior authorization status  Patient's documented medication list, including drug-drug interaction screen and prescriptions for anti-emetics and supportive care specific to the treatment regimen The drug concentrations, fluid compatibility, administration routes, and timing of the medications to be used The patient's access for treatment and lifetime cumulative dose history, if applicable  The patient's medication allergies and previous infusion related reactions, if applicable  Myelodysplastic syndrome with excess blasts  initiation of bridging therapy with Vidaza (azacitidine) injections  Prognosis depends on transplant eligibility and response to therapy.  Changes made to treatment plan:  N/A  Follow up needed:  Pending authorization for treatment    Raven Romero, [facility], 08/23/2024  2:59 PM

## 2024-08-26 ENCOUNTER — Inpatient Hospital Stay

## 2024-08-27 ENCOUNTER — Inpatient Hospital Stay

## 2024-08-28 ENCOUNTER — Inpatient Hospital Stay

## 2024-08-29 ENCOUNTER — Inpatient Hospital Stay

## 2024-08-30 ENCOUNTER — Inpatient Hospital Stay

## 2024-08-30 ENCOUNTER — Inpatient Hospital Stay: Admitting: Oncology

## 2024-09-02 ENCOUNTER — Inpatient Hospital Stay

## 2024-09-02 ENCOUNTER — Inpatient Hospital Stay: Admitting: Oncology

## 2024-09-03 ENCOUNTER — Inpatient Hospital Stay

## 2024-09-04 ENCOUNTER — Inpatient Hospital Stay

## 2024-09-05 ENCOUNTER — Inpatient Hospital Stay

## 2024-09-06 ENCOUNTER — Inpatient Hospital Stay

## 2024-09-09 ENCOUNTER — Inpatient Hospital Stay

## 2024-09-10 ENCOUNTER — Inpatient Hospital Stay

## 2024-09-23 ENCOUNTER — Inpatient Hospital Stay

## 2024-09-23 ENCOUNTER — Inpatient Hospital Stay: Admitting: Nurse Practitioner

## 2024-09-24 ENCOUNTER — Inpatient Hospital Stay

## 2024-09-25 ENCOUNTER — Inpatient Hospital Stay

## 2024-09-26 ENCOUNTER — Inpatient Hospital Stay

## 2024-09-27 ENCOUNTER — Inpatient Hospital Stay

## 2024-09-30 ENCOUNTER — Inpatient Hospital Stay

## 2024-09-30 ENCOUNTER — Inpatient Hospital Stay: Admitting: Oncology

## 2024-10-01 ENCOUNTER — Inpatient Hospital Stay

## 2024-10-02 ENCOUNTER — Inpatient Hospital Stay

## 2024-10-03 ENCOUNTER — Inpatient Hospital Stay

## 2024-10-04 ENCOUNTER — Inpatient Hospital Stay

## 2024-10-07 ENCOUNTER — Inpatient Hospital Stay

## 2024-10-08 ENCOUNTER — Inpatient Hospital Stay
